# Patient Record
Sex: Female | Born: 1965 | Race: White | Hispanic: No | Marital: Married | Smoking: Never smoker
Health system: Southern US, Community
[De-identification: ages and names within clinical notes are randomized; demographics above are authoritative.]

## PROBLEM LIST (undated history)

## (undated) DIAGNOSIS — Z789 Other specified health status: Secondary | ICD-10-CM

## (undated) DIAGNOSIS — T148XXA Other injury of unspecified body region, initial encounter: Secondary | ICD-10-CM

## (undated) DIAGNOSIS — Z9889 Other specified postprocedural states: Secondary | ICD-10-CM

## (undated) DIAGNOSIS — Z950 Presence of cardiac pacemaker: Secondary | ICD-10-CM

## (undated) DIAGNOSIS — I442 Atrioventricular block, complete: Secondary | ICD-10-CM

## (undated) DIAGNOSIS — Z45018 Encounter for adjustment and management of other part of cardiac pacemaker: Secondary | ICD-10-CM

## (undated) DIAGNOSIS — R011 Cardiac murmur, unspecified: Secondary | ICD-10-CM

## (undated) DIAGNOSIS — R112 Nausea with vomiting, unspecified: Secondary | ICD-10-CM

## (undated) HISTORY — PX: VALVE REPLACEMENT: SUR13

## (undated) HISTORY — PX: INSERT / REPLACE / REMOVE PACEMAKER: SUR710

---
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
## 2001-08-20 ENCOUNTER — Other Ambulatory Visit: Admission: RE | Admit: 2001-08-20 | Discharge: 2001-08-20 | Payer: Self-pay | Admitting: Obstetrics and Gynecology

## 2002-12-10 HISTORY — PX: CARDIAC VALVE REPLACEMENT: SHX585

## 2003-03-15 ENCOUNTER — Other Ambulatory Visit: Admission: RE | Admit: 2003-03-15 | Discharge: 2003-03-15 | Payer: Self-pay | Admitting: Obstetrics and Gynecology

## 2003-03-31 ENCOUNTER — Encounter: Admission: RE | Admit: 2003-03-31 | Discharge: 2003-03-31 | Payer: Self-pay | Admitting: Obstetrics and Gynecology

## 2003-03-31 ENCOUNTER — Encounter: Payer: Self-pay | Admitting: Obstetrics and Gynecology

## 2003-04-22 ENCOUNTER — Ambulatory Visit (HOSPITAL_COMMUNITY): Admission: RE | Admit: 2003-04-22 | Discharge: 2003-04-22 | Payer: Self-pay | Admitting: Cardiovascular Disease

## 2003-04-22 ENCOUNTER — Encounter: Payer: Self-pay | Admitting: Cardiovascular Disease

## 2003-04-27 ENCOUNTER — Encounter: Payer: Self-pay | Admitting: Internal Medicine

## 2003-04-27 ENCOUNTER — Emergency Department (HOSPITAL_COMMUNITY): Admission: EM | Admit: 2003-04-27 | Discharge: 2003-04-27 | Payer: Self-pay | Admitting: Emergency Medicine

## 2003-04-27 ENCOUNTER — Encounter: Payer: Self-pay | Admitting: Cardiology

## 2003-08-09 DIAGNOSIS — L80 Vitiligo: Secondary | ICD-10-CM | POA: Insufficient documentation

## 2003-09-20 ENCOUNTER — Encounter (HOSPITAL_COMMUNITY): Admission: RE | Admit: 2003-09-20 | Discharge: 2003-12-19 | Payer: Self-pay | Admitting: Interventional Cardiology

## 2003-11-22 ENCOUNTER — Encounter: Admission: RE | Admit: 2003-11-22 | Discharge: 2003-11-22 | Payer: Self-pay | Admitting: Interventional Cardiology

## 2003-11-22 ENCOUNTER — Encounter (INDEPENDENT_AMBULATORY_CARE_PROVIDER_SITE_OTHER): Payer: Self-pay | Admitting: *Deleted

## 2003-12-20 ENCOUNTER — Encounter (HOSPITAL_COMMUNITY): Admission: RE | Admit: 2003-12-20 | Discharge: 2004-03-19 | Payer: Self-pay | Admitting: Interventional Cardiology

## 2005-04-10 ENCOUNTER — Encounter: Admission: RE | Admit: 2005-04-10 | Discharge: 2005-04-10 | Payer: Self-pay | Admitting: Interventional Cardiology

## 2005-09-20 ENCOUNTER — Encounter: Admission: RE | Admit: 2005-09-20 | Discharge: 2005-09-20 | Payer: Self-pay | Admitting: Internal Medicine

## 2006-10-11 ENCOUNTER — Other Ambulatory Visit: Admission: RE | Admit: 2006-10-11 | Discharge: 2006-10-11 | Payer: Self-pay | Admitting: Obstetrics & Gynecology

## 2006-10-17 ENCOUNTER — Encounter: Admission: RE | Admit: 2006-10-17 | Discharge: 2006-10-17 | Payer: Self-pay | Admitting: Internal Medicine

## 2008-06-07 ENCOUNTER — Other Ambulatory Visit: Admission: RE | Admit: 2008-06-07 | Discharge: 2008-06-07 | Payer: Self-pay | Admitting: Obstetrics & Gynecology

## 2009-07-15 ENCOUNTER — Encounter: Admission: RE | Admit: 2009-07-15 | Discharge: 2009-07-15 | Payer: Self-pay | Admitting: Obstetrics & Gynecology

## 2011-02-09 ENCOUNTER — Other Ambulatory Visit (HOSPITAL_COMMUNITY): Payer: 59

## 2011-02-12 ENCOUNTER — Ambulatory Visit (HOSPITAL_COMMUNITY): Admission: RE | Admit: 2011-02-12 | Payer: 59 | Source: Ambulatory Visit | Admitting: Obstetrics & Gynecology

## 2011-04-27 NOTE — Consult Note (Signed)
NAME:  Kayla Archer, Kayla Archer                        ACCOUNT NO.:  [id number]   MEDICAL RECORD NO.:  [id number]                   PATIENT TYPE:  EMS   LOCATION:  [facility]                                 FACILITY:  [facility]   PHYSICIAN:  Rollene Rotunda, M.D.                DATE OF BIRTH:  [birth year]   DATE OF CONSULTATION:  DATE OF DISCHARGE:                                   CONSULTATION   PRIMARY CARE PHYSICIAN:  Dr. Jonita Albee, [facility].   HISTORY OF PRESENT ILLNESS:  The patient is a pleasant, 45 year old white  female with a history of bicuspid aortic valve and aortic stenosis.  It is  mild to moderate by echocardiogram.  She does have a dilated ascending  aorta.  This is approximately 5 cm by MRI.  She has a 1.6 cm descending  aorta by comparison.  She is being evaluated by surgeons at the [facility] [place] (she has an appointment in the future with them) and is also to see  Dr. Laneta Simmers for consideration of repair of this, given the size of the aorta.  She is followed by Dr. Eden Emms.  She recently had the MRI and was given these  results.  She has been anxious since then.  She traveled to [facility] via  plane this weekend.  She was quite anxious the entire time she was there.  Last night, she felt some chest discomfort and was able to go to bed.  This  morning, she awoke and had 10/10 discomfort.  It was unlike pain she has had  before.  It was a discomfort but did not radiate to her neck or to her  shoulder blades.  There was no radiation down her arms.  She was mildly  nauseated with it.  She said she felt a little difficulty in taking a deep  breath with very slight discomfort with this.  She admits to be extremely  anxious and reports having had recent panic attacks.  She did not try to  take anything over the counter to relieve this.  She presented to the  emergency room.   PAST MEDICAL HISTORY:  1. Mild to moderate aortic stenosis.  2. Bicuspid aortic valve  disease.  3. Lyme disease.  4. Vitiligo.   PAST SURGICAL HISTORY:  None.   ALLERGIES:  NONE.   MEDICATIONS:  None.   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  She does not  smoke cigarettes and does not drink alcohol except rarely, on occasion.  She  does not use recreational drugs.   FAMILY HISTORY:  Noncontributory.   REVIEW OF SYSTEMS:  As stated in the HPI, otherwise negative for other  systems.   PHYSICAL EXAMINATION:  GENERAL:  The patient is in no distress.  VITAL SIGNS:  Blood pressure 131/88 in both arms, heart rate 78 and regular.  HEENT:  Eyes are unremarkable.  Pupils are equal, round and reactive to  light.  Fundi are not visualized.  Oral mucosa unremarkable.  NECK:  No jugular venous distention, wave form within normal limits, carotid  upstroke brisk and symmetric, no bruits or thyromegaly.  LYMPHATIC:  No cervical, axillary or inguinal adenopathy.  LUNGS:  Clear to auscultation bilaterally.  BACK:  No costovertebral angle tenderness.  CHEST:  Unremarkable.  HEART:  PMI not displaced, sustained.  S1 unremarkable, probable opening  snap, no S3, no S4, 2/6 apical systolic murmur with brief radiation at the  aortic outflow tract, no diastolic murmurs.  ABDOMEN:  Flat, positive bowel sounds, normal in frequency and pitch, no  abnormal bruits, no rebound, guarding, no midline pulsatile mass, no  hepatomegaly, no splenomegaly.  SKIN:  No rash, no nodules.  EXTREMITIES:  2+ pulses throughout.  No edema.  NEUROLOGIC:  Oriented to person, place and time.  Cranial nerves II-XII  grossly intact.  Motor grossly intact throughout.   LABORATORY DATA:  Sodium 138, potassium 3.6, BUN 11, creatinine 0.6, WBCs  5.5, hemoglobin 12.8, platelets 242, AST 25, ALT 19, alkaline phosphatase  71, total bilirubin 0.6, total protein 6.8.   Chest x-ray, prominent ascending aorta, no acute disease.   EKG, sinus rhythm, axis within normal limits, intervals are within  normal  limits, no acute ST-T wave changes.   Chest CT, no evidence of dissection or leak.   ASSESSMENT/PLAN:  1. The patient was evaluated in the emergency room.  I had little suspicion     for any dissection, however, given her anxiety and the size of her aorta,     we did perform a CT scan with the results as above.  There was no     evidence of dissection.  She was much relieved with this.  By the time     the results came back, she was pain free.  No further cardiovascular     testing is suggested.  She will follow up with Dr. Laneta Simmers in the     [facility].  2. Breast mass.  There is a 1.8 cm mass reported in the medial right breast.     We will discuss this with Dr. Eden Emms for follow-up.                                               Rollene Rotunda, M.D.    JH/MEDQ  D:  04/27/2003  T:  04/27/2003  Job:  [id number]   cc:   Jonita Albee, M.D.  [facility]   Charlton Haws, M.D.

## 2012-12-24 ENCOUNTER — Other Ambulatory Visit: Payer: Self-pay | Admitting: Obstetrics and Gynecology

## 2012-12-24 DIAGNOSIS — Z1231 Encounter for screening mammogram for malignant neoplasm of breast: Secondary | ICD-10-CM

## 2013-01-19 ENCOUNTER — Ambulatory Visit
Admission: RE | Admit: 2013-01-19 | Discharge: 2013-01-19 | Disposition: A | Discharge status: Home / Self Care | Payer: No Typology Code available for payment source | Source: Ambulatory Visit | Attending: Obstetrics and Gynecology | Admitting: Obstetrics and Gynecology

## 2013-01-19 DIAGNOSIS — Z1231 Encounter for screening mammogram for malignant neoplasm of breast: Secondary | ICD-10-CM

## 2013-03-05 ENCOUNTER — Other Ambulatory Visit: Payer: Self-pay | Admitting: Obstetrics and Gynecology

## 2013-10-20 DIAGNOSIS — M25569 Pain in unspecified knee: Secondary | ICD-10-CM | POA: Insufficient documentation

## 2013-10-27 ENCOUNTER — Other Ambulatory Visit: Payer: Self-pay | Admitting: Orthopedic Surgery

## 2013-11-02 ENCOUNTER — Inpatient Hospital Stay (HOSPITAL_COMMUNITY): Admission: RE | Admit: 2013-11-02 | Payer: No Typology Code available for payment source | Source: Ambulatory Visit

## 2013-11-09 ENCOUNTER — Encounter (HOSPITAL_COMMUNITY): Admission: RE | Payer: Self-pay | Source: Ambulatory Visit

## 2013-11-09 ENCOUNTER — Ambulatory Visit (HOSPITAL_COMMUNITY)
Admission: RE | Admit: 2013-11-09 | Payer: No Typology Code available for payment source | Source: Ambulatory Visit | Admitting: Orthopedic Surgery

## 2013-11-09 SURGERY — ARTHROSCOPY, KNEE
Anesthesia: Regional | Laterality: Right

## 2013-12-14 ENCOUNTER — Other Ambulatory Visit: Payer: Self-pay | Admitting: Orthopedic Surgery

## 2013-12-21 ENCOUNTER — Encounter (HOSPITAL_COMMUNITY): Payer: Self-pay

## 2013-12-21 NOTE — Progress Notes (Addendum)
Anesthesia Chart Review:  Patient is a 48 year old female scheduled for right knee arthroscopy on 12/28/13 by Dr. Sherlean FootLucey.  PAT is scheduled for 12/23/13.  I received cardiology notes to review yesterday afternoon.  History includes Bicuspid AV [facility]/p Bentall [facility] using a Dacron graft and 23 mm bovine pericardial valve.  Her cardiologist is Dr. Verdis PrimeHenry Smith, last visit 09/30/12.    Echo on 09/30/12 Deboraha Sprang([facility]) showed: Bioprosthetic AV with increase in velocity since the prior study (04/19/10), AVA (vel) 1.26 cm2, AVA (vti) 1.47 cm2, peak gradient 41 mmHg, mean gradient 19 mmHg.  Mild prosthetic valvular regurgitation, trace MR, LVEF estimated at 65-70%, mild concentric LVH, doppler findings suggestive of grade II diastolic dysfunction with elevated LA pressure.    I'll plan to speak with patient and review additional history at her PAT visit tomorrow.  I reviewed currently available information with anesthesiologist Dr. Jacklynn BueMassagee.  Patient had mild AS by her echo less than 1 1/2 years ago.  If no new CV symptoms and other preoperative testing is unremarkable then it is anticipated that she can proceed as planned.  Velna Ochsllison Costa Jha, PA-C [facility]/Anesthesiology Phone [phone] 12/22/2013 2:58 PM  Addendum: 12/23/2013 4:45 PM: I evaluated patient during her PAT visit.  She feels well from a CV standpoint.  She denies chest pain, SOB, edema, syncope, or significant palpitations.  Until her knee was bothering her she was able to play tennis.  She is now using the elliptical. Exam show a pleasant Caucasian female in NAD.  BMI 24.7.  Heart regular, with II-III/VI SEM.  Lungs clear.  Preoperative EKG, CXR, and labs noted.  Urine culture is pending.  Anticipate that she can proceed as planned.

## 2013-12-22 NOTE — H&P (Signed)
  NAME: Kayla BullocksMitrina A Archer MRN:   [id number] DOB:   1966/07/16     HISTORY AND PHYSICAL  CHIEF COMPLAINT:  Right shoulder pain  HISTORY:   Kayla A Knightis a 48 y.o. female  with right  Shoulder Pain Patient complains of right shoulder pain. The symptoms began several weeks ago. Aggravating factors: no known event. Pain is located around the acromioclavicular [facility](AC) joint. Discomfort is described as aching. Symptoms are exacerbated by repetitive movements. Evaluation to date: plain films: abnormal ac osteoarthritis and MRI: abnormal rotator cuff tear. Therapy to date includes: avoidance of offending activity, OTC analgesics which are somewhat effective, prescription NSAIDS which are somewhat effective, home exercises which are somewhat effective, physical therapy which was somewhat effective and corticosteroid injection which was somewhat effective.  PAST MEDICAL HISTORY:  No past medical history on file.  PAST SURGICAL HISTORY:  No past surgical history on file.  MEDICATIONS:   No prescriptions prior to admission    ALLERGIES:  No Known Allergies  REVIEW OF SYSTEMS:   Negative except hpi  FAMILY HISTORY:  No family history on file.  SOCIAL HISTORY:   has no tobacco, alcohol, and drug history on file.  PHYSICAL EXAM:  General appearance: alert, cooperative and no distress Resp: clear to auscultation bilaterally Cardio: regular rate and rhythm, S1, S2 normal, no murmur, click, rub or gallop GI: soft, non-tender; bowel sounds normal; no masses,  no organomegaly Extremities: extremities normal, atraumatic, no cyanosis or edema Pulses: 2+ and symmetric Skin: Skin color, texture, turgor normal. No rashes or lesions Neurologic: Alert and oriented X 3, normal strength and tone. Normal symmetric reflexes. Normal coordination and gait    LABORATORY STUDIES: No results found for this basename: WBC, HGB, HCT, PLT,  in the last 72 hours  No results found for this basename: NA, K, CL, CO2,  GLUCOSE, BUN, CREATININE, CALCIUM,  in the last 72 hours  STUDIES/RESULTS:  No results found.  ASSESSMENT:right rotator cuff tear        Active Problems:   * No active hospital problems. *    PLAN:  Right shoulder decompression and rotator cuff repair   Dashanique Brownstein 12/22/2013. 1:38 PM

## 2013-12-23 ENCOUNTER — Ambulatory Visit (HOSPITAL_COMMUNITY)
Admission: RE | Admit: 2013-12-23 | Discharge: 2013-12-23 | Disposition: A | Discharge status: Home / Self Care | Payer: 59 | Source: Ambulatory Visit | Attending: Orthopedic Surgery | Admitting: Orthopedic Surgery

## 2013-12-23 ENCOUNTER — Encounter (HOSPITAL_COMMUNITY): Payer: Self-pay

## 2013-12-23 ENCOUNTER — Encounter (HOSPITAL_COMMUNITY)
Admission: RE | Admit: 2013-12-23 | Discharge: 2013-12-23 | Disposition: A | Discharge status: Home / Self Care | Payer: 59 | Source: Ambulatory Visit | Attending: Orthopedic Surgery | Admitting: Orthopedic Surgery

## 2013-12-23 ENCOUNTER — Other Ambulatory Visit (HOSPITAL_COMMUNITY): Payer: Self-pay

## 2013-12-23 DIAGNOSIS — Z01810 Encounter for preprocedural cardiovascular examination: Secondary | ICD-10-CM | POA: Insufficient documentation

## 2013-12-23 DIAGNOSIS — Z954 Presence of other heart-valve replacement: Secondary | ICD-10-CM | POA: Insufficient documentation

## 2013-12-23 DIAGNOSIS — Z01812 Encounter for preprocedural laboratory examination: Secondary | ICD-10-CM | POA: Insufficient documentation

## 2013-12-23 DIAGNOSIS — Z01818 Encounter for other preprocedural examination: Secondary | ICD-10-CM | POA: Insufficient documentation

## 2013-12-23 HISTORY — DX: Nausea with vomiting, unspecified: R11.2

## 2013-12-23 HISTORY — DX: Other specified health status: Z78.9

## 2013-12-23 HISTORY — DX: Other injury of unspecified body region, initial encounter: T14.8XXA

## 2013-12-23 HISTORY — DX: Other specified postprocedural states: Z98.890

## 2013-12-23 HISTORY — DX: Cardiac murmur, unspecified: R01.1

## 2013-12-23 LAB — URINALYSIS, ROUTINE W REFLEX MICROSCOPIC
BILIRUBIN URINE: NEGATIVE
GLUCOSE, UA: NEGATIVE mg/dL
Hgb urine dipstick: NEGATIVE
KETONES UR: NEGATIVE mg/dL
LEUKOCYTES UA: NEGATIVE
Nitrite: NEGATIVE
Protein, ur: NEGATIVE mg/dL
Specific Gravity, Urine: 1.025 (ref 1.005–1.030)
Urobilinogen, UA: 0.2 mg/dL (ref 0.0–1.0)
pH: 5.5 (ref 5.0–8.0)

## 2013-12-23 LAB — COMPREHENSIVE METABOLIC PANEL
ALT: 17 U/L (ref 0–35)
AST: 21 U/L (ref 0–37)
Albumin: 4.1 g/dL (ref 3.5–5.2)
Alkaline Phosphatase: 79 U/L (ref 39–117)
BILIRUBIN TOTAL: 0.4 mg/dL (ref 0.3–1.2)
BUN: 15 mg/dL (ref 6–23)
CO2: 23 meq/L (ref 19–32)
Calcium: 9.7 mg/dL (ref 8.4–10.5)
Chloride: 102 mEq/L (ref 96–112)
Creatinine, Ser: 0.62 mg/dL (ref 0.50–1.10)
GLUCOSE: 91 mg/dL (ref 70–99)
Potassium: 4.4 mEq/L (ref 3.7–5.3)
SODIUM: 138 meq/L (ref 137–147)
Total Protein: 7.4 g/dL (ref 6.0–8.3)

## 2013-12-23 LAB — CBC WITH DIFFERENTIAL/PLATELET
BASOS ABS: 0 10*3/uL (ref 0.0–0.1)
Basophils Relative: 0 % (ref 0–1)
Eosinophils Absolute: 0.1 10*3/uL (ref 0.0–0.7)
Eosinophils Relative: 1 % (ref 0–5)
HEMATOCRIT: 40.6 % (ref 36.0–46.0)
Hemoglobin: 13.8 g/dL (ref 12.0–15.0)
LYMPHS ABS: 1.6 10*3/uL (ref 0.7–4.0)
LYMPHS PCT: 16 % (ref 12–46)
MCH: 32 pg (ref 26.0–34.0)
MCHC: 34 g/dL (ref 30.0–36.0)
MCV: 94.2 fL (ref 78.0–100.0)
Monocytes Absolute: 0.6 10*3/uL (ref 0.1–1.0)
Monocytes Relative: 6 % (ref 3–12)
NEUTROS ABS: 7.6 10*3/uL (ref 1.7–7.7)
Neutrophils Relative %: 77 % (ref 43–77)
Platelets: 197 10*3/uL (ref 150–400)
RBC: 4.31 MIL/uL (ref 3.87–5.11)
RDW: 12.7 % (ref 11.5–15.5)
WBC: 9.9 10*3/uL (ref 4.0–10.5)

## 2013-12-23 LAB — PROTIME-INR
INR: 1.08 (ref 0.00–1.49)
Prothrombin Time: 13.8 seconds (ref 11.6–15.2)

## 2013-12-23 LAB — APTT: APTT: 30 s (ref 24–37)

## 2013-12-23 LAB — HCG, SERUM, QUALITATIVE: PREG SERUM: NEGATIVE

## 2013-12-23 NOTE — Pre-Procedure Instructions (Addendum)
Kayla Archer  12/23/2013   Your procedure is scheduled on:  12/28/13  [facility] at 830 AM.  Call this number if you have problems the morning of surgery: [phone]   Remember:   Do not eat food or drink liquids after midnight.   Take these medicines the morning of surgery with A SIP OF WATER: none          STOP all herbel meds, nsaids (aleve,naproxen,advil,ibuprofen) tomorrow including vitamins   Do not wear jewelry, make-up or nail polish.  Do not wear lotions, powders, or perfumes. You may wear deodorant.  Do not shave 48 hours prior to surgery. Men may shave face and neck.  Do not bring valuables to the hospital.  [facility] is not responsible                  for any belongings or valuables.               Contacts, dentures or bridgework may not be worn into surgery.  Leave suitcase in the car. After surgery it may be brought to your room.  For patients admitted to the hospital, discharge time is determined by your                treatment team.               Patients discharged the day of surgery will not be allowed to drive  home.  Name and phone number of your driver:   Special Instructions: Shower using CHG 2 nights before surgery and the night before surgery.  If you shower the day of surgery use CHG.  Use special wash - you have one bottle of CHG for all showers.  You should use approximately 1/3 of the bottle for each shower.   Please read over the following fact sheets that you were given: Pain Booklet, Coughing and Deep Breathing and Surgical Site Infection Prevention

## 2013-12-24 LAB — URINE CULTURE: Colony Count: 50000

## 2013-12-27 MED ORDER — CHLORHEXIDINE GLUCONATE 4 % EX LIQD: 60.0000 mL | Freq: Once | CUTANEOUS | Status: DC

## 2013-12-27 MED ORDER — CEFAZOLIN SODIUM-DEXTROSE 2-3 GM-% IV SOLR
2.0000 g | INTRAVENOUS | Status: AC
  Administered 2013-12-28: 2 g via INTRAVENOUS
  Filled 2013-12-27: qty 50

## 2013-12-28 ENCOUNTER — Ambulatory Visit (HOSPITAL_COMMUNITY)
Admission: RE | Admit: 2013-12-28 | Discharge: 2013-12-28 | Disposition: A | Discharge status: Home / Self Care | Payer: 59 | Source: Ambulatory Visit | Attending: Orthopedic Surgery | Admitting: Orthopedic Surgery

## 2013-12-28 ENCOUNTER — Encounter (HOSPITAL_COMMUNITY): Payer: 59 | Admitting: Vascular Surgery

## 2013-12-28 ENCOUNTER — Encounter (HOSPITAL_COMMUNITY)
Admission: RE | Disposition: A | Discharge status: Home / Self Care | Payer: Self-pay | Source: Ambulatory Visit | Attending: Orthopedic Surgery

## 2013-12-28 ENCOUNTER — Encounter (HOSPITAL_COMMUNITY): Payer: Self-pay | Admitting: Certified Registered"

## 2013-12-28 ENCOUNTER — Ambulatory Visit (HOSPITAL_COMMUNITY): Payer: 59 | Admitting: Certified Registered"

## 2013-12-28 DIAGNOSIS — M6750 Plica syndrome, unspecified knee: Secondary | ICD-10-CM | POA: Insufficient documentation

## 2013-12-28 DIAGNOSIS — IMO0002 Reserved for concepts with insufficient information to code with codable children: Secondary | ICD-10-CM | POA: Insufficient documentation

## 2013-12-28 DIAGNOSIS — X58XXXA Exposure to other specified factors, initial encounter: Secondary | ICD-10-CM | POA: Insufficient documentation

## 2013-12-28 HISTORY — PX: KNEE ARTHROSCOPY: SHX127

## 2013-12-28 SURGERY — ARTHROSCOPY, KNEE
Anesthesia: General | Site: Knee | Laterality: Right

## 2013-12-28 MED ORDER — PROPOFOL 10 MG/ML IV BOLUS
INTRAVENOUS | Status: DC | PRN
  Administered 2013-12-28: 160 mg via INTRAVENOUS

## 2013-12-28 MED ORDER — ONDANSETRON HCL 4 MG/2ML IJ SOLN
INTRAMUSCULAR | Status: DC | PRN
  Administered 2013-12-28: 4 mg via INTRAVENOUS

## 2013-12-28 MED ORDER — MIDAZOLAM HCL 2 MG/2ML IJ SOLN
INTRAMUSCULAR | Status: AC
  Filled 2013-12-28: qty 2

## 2013-12-28 MED ORDER — LACTATED RINGERS IV SOLN
INTRAVENOUS | Status: DC
Start: 2013-12-28 — End: 2013-12-28
  Administered 2013-12-28: 10:00:00 via INTRAVENOUS

## 2013-12-28 MED ORDER — FENTANYL CITRATE 0.05 MG/ML IJ SOLN: 50.0000 ug | Freq: Once | INTRAMUSCULAR | Status: DC

## 2013-12-28 MED ORDER — HYDROMORPHONE HCL PF 1 MG/ML IJ SOLN: 0.2500 mg | INTRAMUSCULAR | Status: DC | PRN

## 2013-12-28 MED ORDER — ARTIFICIAL TEARS OP OINT
TOPICAL_OINTMENT | OPHTHALMIC | Status: DC | PRN
  Administered 2013-12-28: 1 via OPHTHALMIC

## 2013-12-28 MED ORDER — SODIUM CHLORIDE 0.9 % IR SOLN
Status: DC | PRN
  Administered 2013-12-28: 3000 mL

## 2013-12-28 MED ORDER — MIDAZOLAM HCL 2 MG/2ML IJ SOLN: 1.0000 mg | INTRAMUSCULAR | Status: DC | PRN

## 2013-12-28 MED ORDER — LIDOCAINE HCL (CARDIAC) 20 MG/ML IV SOLN
INTRAVENOUS | Status: DC | PRN
  Administered 2013-12-28: 80 mg via INTRAVENOUS

## 2013-12-28 MED ORDER — CHLORHEXIDINE GLUCONATE 4 % EX LIQD: 60.0000 mL | Freq: Once | CUTANEOUS | Status: DC

## 2013-12-28 MED ORDER — FENTANYL CITRATE 0.05 MG/ML IJ SOLN
INTRAMUSCULAR | Status: AC
  Filled 2013-12-28: qty 2

## 2013-12-28 MED ORDER — DEXAMETHASONE SODIUM PHOSPHATE 4 MG/ML IJ SOLN
INTRAMUSCULAR | Status: DC | PRN
  Administered 2013-12-28: 4 mg via INTRAVENOUS

## 2013-12-28 MED ORDER — PROMETHAZINE HCL 25 MG/ML IJ SOLN: 6.2500 mg | INTRAMUSCULAR | Status: DC | PRN

## 2013-12-28 MED ORDER — BUPIVACAINE-EPINEPHRINE (PF) 0.5% -1:200000 IJ SOLN
INTRAMUSCULAR | Status: AC
  Filled 2013-12-28: qty 10

## 2013-12-28 MED ORDER — BUPIVACAINE-EPINEPHRINE 0.5% -1:200000 IJ SOLN
INTRAMUSCULAR | Status: DC | PRN
  Administered 2013-12-28: 30 mL

## 2013-12-28 MED ORDER — OXYCODONE HCL 5 MG PO TABS: 5.0000 mg | ORAL_TABLET | Freq: Once | ORAL | Status: DC | PRN

## 2013-12-28 MED ORDER — FENTANYL CITRATE 0.05 MG/ML IJ SOLN
INTRAMUSCULAR | Status: DC | PRN
  Administered 2013-12-28: 100 ug via INTRAVENOUS
  Administered 2013-12-28: 50 ug via INTRAVENOUS

## 2013-12-28 MED ORDER — OXYCODONE HCL 5 MG/5ML PO SOLN: 5.0000 mg | Freq: Once | ORAL | Status: DC | PRN

## 2013-12-28 MED ORDER — MIDAZOLAM HCL 5 MG/5ML IJ SOLN
INTRAMUSCULAR | Status: DC | PRN
  Administered 2013-12-28: 2 mg via INTRAVENOUS

## 2013-12-28 MED ORDER — SODIUM CHLORIDE 0.9 % IV SOLN: INTRAVENOUS | Status: DC

## 2013-12-28 SURGICAL SUPPLY — 42 items
BANDAGE ELASTIC 6 VELCRO ST LF (GAUZE/BANDAGES/DRESSINGS) ×2 IMPLANT
BANDAGE ESMARK 6X9 LF (GAUZE/BANDAGES/DRESSINGS) IMPLANT
BLADE CUDA 5.5 (BLADE) IMPLANT
BLADE CUTTER GATOR 3.5 (BLADE) IMPLANT
BLADE GREAT WHITE 4.2 (BLADE) ×2 IMPLANT
BNDG CMPR 9X6 STRL LF SNTH (GAUZE/BANDAGES/DRESSINGS)
BNDG ESMARK 6X9 LF (GAUZE/BANDAGES/DRESSINGS)
BUR OVAL 6.0 (BURR) IMPLANT
CLOTH BEACON ORANGE TIMEOUT ST (SAFETY) ×2 IMPLANT
CUFF TOURNIQUET SINGLE 34IN LL (TOURNIQUET CUFF) ×1 IMPLANT
DRAPE ARTHROSCOPY W/POUCH 114 (DRAPES) ×2 IMPLANT
DRAPE U-SHAPE 47X51 STRL (DRAPES) ×2 IMPLANT
DRSG EMULSION OIL 3X3 NADH (GAUZE/BANDAGES/DRESSINGS) ×1 IMPLANT
ELECT CAUTERY BLADE 6.4 (BLADE) IMPLANT
ELECT MENISCUS 165MM 90D (ELECTRODE) IMPLANT
ELECT REM PT RETURN 9FT ADLT (ELECTROSURGICAL)
ELECTRODE REM PT RTRN 9FT ADLT (ELECTROSURGICAL) IMPLANT
GAUZE XEROFORM 1X8 LF (GAUZE/BANDAGES/DRESSINGS) ×1 IMPLANT
GLOVE BIOGEL PI IND STRL 8.5 (GLOVE) ×2 IMPLANT
GLOVE BIOGEL PI INDICATOR 8.5 (GLOVE) ×1
GLOVE SURG ORTHO 8.0 STRL STRW (GLOVE) ×3 IMPLANT
GOWN L4 XLG 20 PK N/S (GOWN DISPOSABLE) ×2 IMPLANT
GOWN STRL NON-REIN LRG LVL3 (GOWN DISPOSABLE) ×3 IMPLANT
KIT ROOM TURNOVER OR (KITS) ×2 IMPLANT
MANIFOLD NEPTUNE II (INSTRUMENTS) ×2 IMPLANT
NDL 18GX1X1/2 (RX/OR ONLY) (NEEDLE) ×1 IMPLANT
NEEDLE 18GX1X1/2 (RX/OR ONLY) (NEEDLE) ×2 IMPLANT
PACK ARTHROSCOPY DSU (CUSTOM PROCEDURE TRAY) ×2 IMPLANT
PAD ARMBOARD 7.5X6 YLW CONV (MISCELLANEOUS) ×3 IMPLANT
PADDING CAST COTTON 6X4 STRL (CAST SUPPLIES) ×1 IMPLANT
PENCIL BUTTON HOLSTER BLD 10FT (ELECTRODE) IMPLANT
SET ARTHROSCOPY TUBING (MISCELLANEOUS) ×2
SET ARTHROSCOPY TUBING LN (MISCELLANEOUS) ×1 IMPLANT
SPONGE GAUZE 4X4 12PLY (GAUZE/BANDAGES/DRESSINGS) ×2 IMPLANT
SPONGE LAP 4X18 X RAY DECT (DISPOSABLE) ×2 IMPLANT
SUT ETHILON 4 0 PS 2 18 (SUTURE) ×2 IMPLANT
SYR 30ML LL (SYRINGE) ×4 IMPLANT
TOWEL OR 17X24 6PK STRL BLUE (TOWEL DISPOSABLE) ×2 IMPLANT
TOWEL OR 17X26 10 PK STRL BLUE (TOWEL DISPOSABLE) ×2 IMPLANT
TUBE CONNECTING 12X1/4 (SUCTIONS) ×2 IMPLANT
WAND 90 DEG TURBOVAC W/CORD (SURGICAL WAND) ×1 IMPLANT
WATER STERILE IRR 1000ML POUR (IV SOLUTION) ×2 IMPLANT

## 2013-12-28 NOTE — Interval H&P Note (Signed)
History and Physical Interval Note:  12/28/2013 6:51 AM  Kayla Archer  has presented today for surgery, with the diagnosis of medial meniscus tear right knee  The various methods of treatment have been discussed with the patient and family. After consideration of risks, benefits and other options for treatment, the patient has consented to  Procedure(s): ARTHROSCOPY KNEE (Right) as a surgical intervention .  The patient's history has been reviewed, patient examined, no change in status, stable for surgery.  I have reviewed the patient's chart and labs.  Questions were answered to the patient's satisfaction.     Searra Carnathan,STEPHEN D

## 2013-12-28 NOTE — Transfer of Care (Signed)
Immediate Anesthesia Transfer of Care Note  Patient: Kayla Archer  Procedure(s) Performed: Procedure(s): ARTHROSCOPY KNEE (Right)  Patient Location: PACU  Anesthesia Type:General  Level of Consciousness: awake, alert  and oriented  Airway & Oxygen Therapy: Patient Spontanous Breathing and Patient connected to nasal cannula oxygen  Post-op Assessment: Report given to PACU RN and Patient moving all extremities X 4  Post vital signs: Reviewed and stable  Complications: No apparent anesthesia complications

## 2013-12-28 NOTE — Preoperative (Signed)
Beta Blockers   Reason not to administer Beta Blockers:Not Applicable 

## 2013-12-28 NOTE — Op Note (Signed)
Dictation Number:  [id number]

## 2013-12-28 NOTE — Anesthesia Preprocedure Evaluation (Addendum)
Anesthesia Evaluation  Patient identified by MRN, date of birth, ID band Patient awake    Reviewed: Allergy & Precautions, H&P , NPO status , Patient's Chart, lab work & pertinent test results  History of Anesthesia Complications (+) PONV and history of anesthetic complications  Airway Mallampati: I TM Distance: >3 FB Neck ROM: Full    Dental  (+) Teeth Intact and Dental Advisory Given   Pulmonary  breath sounds clear to auscultation        Cardiovascular + Valvular Problems/Murmurs Rhythm:Regular Rate:Normal + Systolic murmurs H/o AVR   Neuro/Psych    GI/Hepatic   Endo/Other    Renal/GU      Musculoskeletal   Abdominal   Peds  Hematology   Anesthesia Other Findings   Reproductive/Obstetrics                          Anesthesia Physical Anesthesia Plan  ASA: II  Anesthesia Plan: General   Post-op Pain Management:    Induction: Intravenous  Airway Management Planned: LMA  Additional Equipment:   Intra-op Plan:   Post-operative Plan: Extubation in OR  Informed Consent: I have reviewed the patients History and Physical, chart, labs and discussed the procedure including the risks, benefits and alternatives for the proposed anesthesia with the patient or authorized representative who has indicated his/her understanding and acceptance.     Plan Discussed with: CRNA and Surgeon  Anesthesia Plan Comments:         Anesthesia Quick Evaluation

## 2013-12-28 NOTE — Discharge Instructions (Signed)
Diet: As you were doing prior to hospitalization  ° °Activity:  Increase activity slowly as tolerated  °                No lifting or driving for 6 weeks ° °Shower:  May shower without a dressing once there is no drainage from your wound. °                Do NOT wash over the wound. °                °Dressing:  You may change your dressing on Wednesday °                   Then change the dressing daily with sterile 4"x4"s gauze dressing  °                   And TED hose for knees. ° °Weight Bearing:  Weight bearing as tolerated as taught in physical therapy.  Use a                                walker or Crutches as instructed. ° °To prevent constipation: you may use a stool softener such as - °              Colace ( over the counter) 100 mg by mouth twice a day  °              Drink plenty of fluids ( prune juice may be helpful) and high fiber foods °               Miralax ( over the counter) for constipation as needed.   ° °Precautions:  If you experience chest pain or shortness of breath - call 911 immediately               For transfer to the hospital emergency department!! °              If you develop a fever greater that 101 F, purulent drainage from wound,                             increased redness or drainage from wound, or calf pain -- Call the office. ° °Follow- Up Appointment:  Please call for an appointment to be seen on Monday January 04, 2014 °                                             [place] office:  [phone] °           [address] [place], [place] [address] ° °What to eat: ° °For your first meals, you should eat lightly; only small meals initially.  If you do not have nausea, you may eat larger meals.  Avoid spicy, greasy and heavy food.   ° °General Anesthesia, Adult, Care After  °Refer to this sheet in the next few weeks. These instructions provide you with information on caring for yourself after your procedure. Your health care provider may also give you more specific  instructions. Your treatment has been planned according to current medical practices, but problems sometimes occur. Call your health care provider if you have any problems or questions   after your procedure.  °WHAT TO EXPECT AFTER THE PROCEDURE  °After the procedure, it is typical to experience:  °Sleepiness.  °Nausea and vomiting. °HOME CARE INSTRUCTIONS  °For the first 24 hours after general anesthesia:  °Have a responsible person with you.  °Do not drive a car. If you are alone, do not take public transportation.  °Do not drink alcohol.  °Do not take medicine that has not been prescribed by your health care provider.  °Do not sign important papers or make important decisions.  °You may resume a normal diet and activities as directed by your health care provider.  °Change bandages (dressings) as directed.  °If you have questions or problems that seem related to general anesthesia, call the hospital and ask for the anesthetist or anesthesiologist on call. °SEEK MEDICAL CARE IF:  °You have nausea and vomiting that continue the day after anesthesia.  °You develop a rash. °SEEK IMMEDIATE MEDICAL CARE IF:  °You have difficulty breathing.  °You have chest pain.  °You have any allergic problems. °Document Released: 03/04/2001 Document Revised: 07/29/2013 Document Reviewed: 06/11/2013  °ExitCare® Patient Information ©2014 [employer].  ° ° °               ° ° °

## 2013-12-28 NOTE — Anesthesia Postprocedure Evaluation (Signed)
  Anesthesia Post-op Note  Patient: Kayla Archer  Procedure(s) Performed: Procedure(s): ARTHROSCOPY KNEE (Right)  Patient Location: PACU  Anesthesia Type:General  Level of Consciousness: awake and alert   Airway and Oxygen Therapy: Patient Spontanous Breathing  Post-op Pain: mild  Post-op Assessment: Post-op Vital signs reviewed, Patient's Cardiovascular Status Stable, Respiratory Function Stable, Patent Airway, No signs of Nausea or vomiting and Pain level controlled  Post-op Vital Signs: Reviewed and stable  Complications: No apparent anesthesia complications

## 2013-12-29 NOTE — Op Note (Signed)
NAMMerlyn Albert:  Archer, Kayla Archer              ACCOUNT NO.:  [id number]  MEDICAL RECORD NO.:  [id number]  LOCATION:  [facility]                         FACILITY:  [facility]  PHYSICIAN:  Mila HomerStephen D. Sherlean FootLucey, M.D. DATE OF BIRTH:  Sep 25, 1966  DATE OF PROCEDURE:  12/28/2013 DATE OF DISCHARGE:  12/28/2013                              OPERATIVE REPORT   SURGEON:  Mila HomerStephen D. Sherlean FootLucey, M.D.  ASSISTANT:  None.  ANESTHESIA:  General.  PREOPERATIVE DIAGNOSIS:  Right knee medial meniscus tear.  POSTOPERATIVE DIAGNOSIS:  Right knee medial meniscus tear.  PROCEDURE:  Right knee arthroscopy with partial medial meniscectomy, chondroplasty of medial femoral condyle, and plica resection.  INDICATION FOR PROCEDURE:  The patient is a 48 year old white female __________.  Informed consent was obtained.  DESCRIPTION OF PROCEDURE:  The patient was laid supine, administered general anesthesia.  Right leg prepped and draped in usual sterile fashion.  Inferolateral and inferomedial portal were created with an #11 blade, blunt trocar, and cannula.  Diagnostic arthroscopy revealed no chondromalacia in the patellofemoral joint __________ which was __________ and I elected to debride this with a small rongeur.  I then went into the medial compartment and with a 7 x 7 mm loose articular cartilage, I debrided that __________ area.  I then went into the posterior recess of the medial compartment and found the radial tear of the meniscus __________ straight forceps and __________ shaver. __________ ACL and PCL were normal __________ lateral compartment in the __________ position, this was normal as well.  I then washed, __________ dressed with Xeroform, dressing sponges, sterile Webril, and Ace wrap.  COMPLICATIONS:  None.  DRAINS:  None.         ______________________________ Mila HomerStephen D. Sherlean FootLucey, M.D.    SDL/MEDQ  D:  12/28/2013  T:  12/29/2013  Job:  [id number]

## 2013-12-31 ENCOUNTER — Encounter (HOSPITAL_COMMUNITY): Payer: Self-pay | Admitting: Orthopedic Surgery

## 2014-02-22 ENCOUNTER — Other Ambulatory Visit (HOSPITAL_COMMUNITY): Payer: Self-pay | Admitting: Otolaryngology

## 2014-02-22 DIAGNOSIS — R1310 Dysphagia, unspecified: Secondary | ICD-10-CM

## 2014-02-24 ENCOUNTER — Ambulatory Visit (HOSPITAL_COMMUNITY): Admission: RE | Admit: 2014-02-24 | Payer: 59 | Source: Ambulatory Visit

## 2014-02-24 ENCOUNTER — Ambulatory Visit (HOSPITAL_COMMUNITY): Payer: 59

## 2014-02-24 ENCOUNTER — Inpatient Hospital Stay (HOSPITAL_COMMUNITY): Admission: RE | Admit: 2014-02-24 | Payer: 59 | Source: Ambulatory Visit

## 2014-02-25 ENCOUNTER — Telehealth: Payer: Self-pay | Admitting: Interventional Cardiology

## 2014-02-25 NOTE — Telephone Encounter (Signed)
lmom.returned pt call.pt adv of appt already scheduled with Dawayne PatriciaLori G. on 03/05/14.pt f/u echo can be schduled then.pt to call the office if symptoms increase/worsen

## 2014-02-25 NOTE — Telephone Encounter (Signed)
New Prob    Pt reports new flutters in her chest and chest heaviness/tightness onset 1 month. Pt is concerned and would like to speak to a nurse.  Pt is also ready to schedule yearly ECHO. Order needed in EPIC.

## 2014-03-05 ENCOUNTER — Ambulatory Visit: Payer: 59 | Admitting: Nurse Practitioner

## 2014-03-10 ENCOUNTER — Ambulatory Visit (HOSPITAL_COMMUNITY)
Admission: RE | Admit: 2014-03-10 | Discharge: 2014-03-10 | Disposition: A | Discharge status: Home / Self Care | Payer: 59 | Source: Ambulatory Visit | Attending: Otolaryngology | Admitting: Otolaryngology

## 2014-03-10 DIAGNOSIS — R1310 Dysphagia, unspecified: Secondary | ICD-10-CM | POA: Insufficient documentation

## 2014-03-10 NOTE — Procedures (Signed)
Objective Swallowing Evaluation: Modified Barium Swallowing Study  Patient Details  Name: Kayla Archer MRN: [id number] Date of Birth: 1966-08-10  Today's Date: 03/10/2014 Time: [id number] SLP Time Calculation (min): 30 min  Past Medical History:  Past Medical History  Diagnosis Date  . PONV (postoperative nausea and vomiting)     plus hypotension  . Medical history non-contributory     see valve rep-  . Heart murmur   . Cartilage tear     rt knee   Past Surgical History:  Past Surgical History  Procedure Laterality Date  . Cardiac valve replacement  04    thoracic aortic aneurysm plus valve  . Knee arthroscopy Right 12/28/2013    Procedure: ARTHROSCOPY KNEE;  Surgeon: Dannielle HuhSteve Lucey, MD;  [facility];  Service: Orthopedics;  Laterality: Right;   HPI:  Pt. is a 48  yr old female seen for outpatient MBS with complaints of 3 year onset of esophageal/pharyngeal globus sensation gradually worsening. She denies chronic burning sensation but describes episodes of acute esophageal pain that completely resolves when food transits to LES.  She denies pna or additional acute illness.  Pt. has been evaluated by allergist and ENT.       Assessment / Plan / Recommendation Clinical Impression  Dysphagia Diagnosis: Within Functional Limits Clinical impression: Pt. exhibits a functional pharyngeal phase of swallow, however initiation is delayed to the level of the pyriform sinuses.  No laryngeal penetration, aspiration or pharyngeal residue observed.  If pt. is experiencing reflux with acidic material to pharyngeal area, this may explain decreased sensation leading to delayed swallow initiation.  Pt. scheduled for a barium esophagram following MBS.  SLP recommends she continue regular diet and thin liquids and provided education on esophageal precautions.  Educated pt. if symptoms persist, a GI consult may be beneficial.     Treatment Recommendation  No treatment recommended at this time     Diet Recommendation Regular;Thin liquid   Liquid Administration via: Cup;Straw Medication Administration: Whole meds with liquid Supervision: Patient able to self feed Compensations: Slow rate;Follow solids with liquid;Small sips/bites Postural Changes and/or Swallow Maneuvers: Seated upright 90 degrees;Upright 30-60 min after meal    Other  Recommendations Oral Care Recommendations: Oral care BID   Follow Up Recommendations  None    Frequency and Duration        Pertinent Vitals/Pain WDL            Reason for Referral Objectively evaluate swallowing function   Oral Phase Oral Preparation/Oral Phase Oral Phase: WFL   Pharyngeal Phase Pharyngeal Phase Pharyngeal Phase: Impaired Pharyngeal - Thin Pharyngeal - Thin Cup: Delayed swallow initiation;Premature spillage to pyriform sinuses Pharyngeal - Thin Straw: Delayed swallow initiation;Premature spillage to pyriform sinuses  Cervical Esophageal Phase    GO    Cervical Esophageal Phase [facility]ervical [facility]ophageal [facility]: [facility]    Functional Assessment Tool Used: clinical judgement Functional Limitations: Swallowing Swallow Current Status ([id number]): At least 1 percent but less than 20 percent impaired, limited or restricted Swallow Goal Status [phone](G8997): At least 1 percent but less than 20 percent impaired, limited or restricted Swallow Discharge Status [phone](G8998): At least 1 percent but less than 20 percent impaired, limited or restricted    Royce MacadamiaLisa Willis Patti Shorb M.Ed [employer] Pager [phone]  03/10/2014

## 2014-03-31 ENCOUNTER — Ambulatory Visit (INDEPENDENT_AMBULATORY_CARE_PROVIDER_SITE_OTHER): Payer: 59 | Admitting: Interventional Cardiology

## 2014-03-31 ENCOUNTER — Encounter: Payer: Self-pay | Admitting: Interventional Cardiology

## 2014-03-31 VITALS — BP 114/63 | HR 61 | Ht 63.0 in | Wt 137.0 lb

## 2014-03-31 DIAGNOSIS — I351 Nonrheumatic aortic (valve) insufficiency: Secondary | ICD-10-CM | POA: Insufficient documentation

## 2014-03-31 DIAGNOSIS — I359 Nonrheumatic aortic valve disorder, unspecified: Secondary | ICD-10-CM

## 2014-03-31 DIAGNOSIS — R072 Precordial pain: Secondary | ICD-10-CM

## 2014-03-31 DIAGNOSIS — R0789 Other chest pain: Secondary | ICD-10-CM

## 2014-03-31 DIAGNOSIS — Z953 Presence of xenogenic heart valve: Secondary | ICD-10-CM | POA: Insufficient documentation

## 2014-03-31 DIAGNOSIS — Z952 Presence of prosthetic heart valve: Secondary | ICD-10-CM

## 2014-03-31 NOTE — Progress Notes (Signed)
Patient ID: Tonda A Jilek, female   DOB: [birth year], 48 y.o.   MRN: [id number]    [address] [place], [place]  [name] Phone: [phone](336) [phone] Fax:  [phone]  Date:  03/31/2014   ID:  Carey BullocksMitrina A Waggoner, DOB [birth year], MRN [id number]  PCP:  Duke SalviaGORE,DENISE, NP   ASSESSMENT:  1. bioprosthetic aortic valve, 2005 2. Valve dysfunction with mild aortic regurgitation detected by echocardiogram and auscultation 2013, October 3. Recurring musculoskeletal sternal chest discomfort 4. Stress and anxiety  PLAN:  1. Increase physical activity 2. Followup in one year 3. Call of exertional chest discomfort or dyspnea   SUBJECTIVE: Corretta A Terrilee CroakKnight is a 48 y.o. female who is doing well. She is now 10 years out from bioprosthetic aortic valve replacement. She denies orthopnea, PND, prolonged palpitations, transient neurological symptoms, edema, and exertional chest pain. She has not had syncope.    Wt Readings from Last 3 Encounters:  03/31/14 137 lb (62.143 kg)  12/23/13 139 lb 4.8 oz (63.186 kg)     Past Medical History  Diagnosis Date  . PONV (postoperative nausea and vomiting)     plus hypotension  . Medical history non-contributory     see valve rep-  . Heart murmur   . Cartilage tear     rt knee    Current Outpatient Prescriptions  Medication Sig Dispense Refill  . calcium citrate-vitamin D (CITRACAL+D) 315-200 MG-UNIT per tablet Take 1 tablet by mouth as needed.       Marland Kitchen. ibuprofen (ADVIL,MOTRIN) 200 MG tablet Take 400 mg by mouth every 8 (eight) hours as needed for headache or mild pain.       No current facility-administered medications for this visit.    Allergies:   No Known Allergies  Social History:  The patient  reports that she has never smoked. She does not have any smokeless tobacco history on file. She reports that she drinks about 1.8 ounces of alcohol per week. She reports that she does not use illicit drugs.   ROS:  Please see the history of  present illness.   Appetite stable. Denies chills and fever.   All other systems reviewed and negative.   OBJECTIVE: VS:  BP 114/63  Pulse 61  Ht 5\' 3"  (1.6 m)  Wt 137 lb (62.143 kg)  BMI 24.27 kg/m2  LMP 02/06/2014 Well nourished, well developed, in no acute distress, appears consistent with her stated age HEENT: normal Neck: JVD flat. Carotid bruit absent with the exception of a transmitted murmur in the left carotid from the aortic valve  Cardiac:  normal S1, S2; RRR; 2/6 decrescendo murmur of aortic regurgitation murmur Lungs:  clear to auscultation bilaterally, no wheezing, rhonchi or rales Abd: soft, nontender, no hepatomegaly Ext: Edema absent. Pulses 2+ Skin: warm and dry Neuro:  CNs 2-12 intact, no focal abnormalities noted  EKG:  Not repeated       Signed, Darci NeedleHenry W. B. Cornella Emmer III, MD 03/31/2014 9:01 AM

## 2014-03-31 NOTE — Patient Instructions (Signed)
Your physician recommends that you continue on your current medications as directed. Please refer to the Current Medication list given to you today.  Your physician wants you to follow-up in: 1 year. You will receive a reminder letter in the mail two months in advance. If you don't receive a letter, please call our office to schedule the follow-up appointment.  

## 2014-07-07 ENCOUNTER — Encounter: Payer: Self-pay | Admitting: Interventional Cardiology

## 2014-11-02 ENCOUNTER — Other Ambulatory Visit: Payer: Self-pay | Admitting: Orthopedic Surgery

## 2014-11-02 DIAGNOSIS — Z9889 Other specified postprocedural states: Secondary | ICD-10-CM | POA: Insufficient documentation

## 2014-11-02 DIAGNOSIS — M25562 Pain in left knee: Principal | ICD-10-CM

## 2014-11-02 DIAGNOSIS — M25561 Pain in right knee: Secondary | ICD-10-CM

## 2014-11-08 ENCOUNTER — Ambulatory Visit
Admission: RE | Admit: 2014-11-08 | Discharge: 2014-11-08 | Disposition: A | Discharge status: Home / Self Care | Payer: 59 | Source: Ambulatory Visit | Attending: Orthopedic Surgery | Admitting: Orthopedic Surgery

## 2014-11-08 DIAGNOSIS — M25561 Pain in right knee: Secondary | ICD-10-CM

## 2014-11-08 DIAGNOSIS — M25562 Pain in left knee: Principal | ICD-10-CM

## 2015-02-14 ENCOUNTER — Telehealth: Payer: Self-pay | Admitting: Interventional Cardiology

## 2015-02-14 DIAGNOSIS — Z952 Presence of prosthetic heart valve: Secondary | ICD-10-CM

## 2015-02-14 NOTE — Telephone Encounter (Signed)
New Message   Pt called states that she normally has an ECHO before appt. Please put in orders if needed//sr

## 2015-02-16 NOTE — Telephone Encounter (Signed)
Routed to Dr.Smith to advise 

## 2015-02-17 ENCOUNTER — Encounter (HOSPITAL_COMMUNITY): Payer: Self-pay | Admitting: Cardiovascular Disease

## 2015-02-17 NOTE — Telephone Encounter (Signed)
Yes please order and echo: Dx is Prosthetic Aortic valve and AR

## 2015-02-17 NOTE — Telephone Encounter (Signed)
Order in HerminieEpic. Message routed to pcc to schedule

## 2015-03-14 ENCOUNTER — Other Ambulatory Visit (HOSPITAL_COMMUNITY): Payer: Self-pay

## 2015-03-24 ENCOUNTER — Ambulatory Visit (HOSPITAL_COMMUNITY): Payer: 59 | Attending: Cardiology

## 2015-03-24 DIAGNOSIS — Z954 Presence of other heart-valve replacement: Secondary | ICD-10-CM | POA: Diagnosis not present

## 2015-03-24 DIAGNOSIS — Z952 Presence of prosthetic heart valve: Secondary | ICD-10-CM

## 2015-03-24 NOTE — Progress Notes (Signed)
2D Echo completed. 03/24/2015 

## 2015-03-29 ENCOUNTER — Telehealth: Payer: Self-pay

## 2015-03-29 NOTE — Telephone Encounter (Signed)
pt aware of echo results.Everything looks normal.pt verbalized understanding.

## 2015-03-29 NOTE — Telephone Encounter (Signed)
-----   Message from Lyn RecordsHenry W Smith, MD sent at 03/24/2015  6:03 PM EDT ----- Everything looks normal

## 2015-04-18 ENCOUNTER — Ambulatory Visit (INDEPENDENT_AMBULATORY_CARE_PROVIDER_SITE_OTHER): Payer: 59 | Admitting: Interventional Cardiology

## 2015-04-18 ENCOUNTER — Encounter: Payer: Self-pay | Admitting: Interventional Cardiology

## 2015-04-18 VITALS — BP 124/66 | HR 58 | Ht 63.0 in | Wt 138.4 lb

## 2015-04-18 DIAGNOSIS — Z954 Presence of other heart-valve replacement: Secondary | ICD-10-CM

## 2015-04-18 DIAGNOSIS — R0789 Other chest pain: Secondary | ICD-10-CM

## 2015-04-18 DIAGNOSIS — Z953 Presence of xenogenic heart valve: Secondary | ICD-10-CM

## 2015-04-18 DIAGNOSIS — I359 Nonrheumatic aortic valve disorder, unspecified: Secondary | ICD-10-CM

## 2015-04-18 DIAGNOSIS — R072 Precordial pain: Secondary | ICD-10-CM

## 2015-04-18 DIAGNOSIS — I351 Nonrheumatic aortic (valve) insufficiency: Secondary | ICD-10-CM

## 2015-04-18 NOTE — Progress Notes (Signed)
Cardiology Office Note   Date:  04/18/2015   ID:  Kayla BullocksMitrina A Archer, DOB 10-20-1966, MRN [id number]  PCP:  No primary care provider on file.  Cardiologist:   Kayla Archer,Yan Pankratz W, MD   Chief Complaint  Patient presents with  . Aortic Regurgitation      History of Present Illness: Kayla Archer is a 49 y.o. female who presents for bowel prosthetic aortic valve for aortic stenosis [facility] in 2005 [facility].  She is doing well. She has no cardiopulmonary complaints. Recent echocardiogram demonstrated bile mitral regurgitation, normal LV size, and normal systolic function. She works and has no limitations due to dyspnea or fatigue. She denies chills and fever.    Past Medical History  Diagnosis Date  . PONV (postoperative nausea and vomiting)     plus hypotension  . Medical history non-contributory     see valve rep-  . Heart murmur   . Cartilage tear     rt knee    Past Surgical History  Procedure Laterality Date  . Cardiac valve replacement  04    thoracic aortic aneurysm plus valve  . Knee arthroscopy Right 12/28/2013    Procedure: ARTHROSCOPY KNEE;  Surgeon: Dannielle HuhSteve Lucey, MD;  [facility];  Service: Orthopedics;  Laterality: Right;     Current Outpatient Prescriptions  Medication Sig Dispense Refill  . ibuprofen (ADVIL,MOTRIN) 200 MG tablet Take 400 mg by mouth every 8 (eight) hours as needed for headache or mild pain.     No current facility-administered medications for this visit.    Allergies:   Review of patient's allergies indicates no known allergies.    Social History:  The patient  reports that she has never smoked. She does not have any smokeless tobacco history on file. She reports that she drinks about 1.8 oz of alcohol per week. She reports that she does not use illicit drugs.   Family History:  The patient's family history includes Diabetes type II in her mother, sister, and sister; Healthy in her brother; Heart attack in her father;  Hypertension in her mother, sister, and sister; Other in her mother and sister; Stroke in her father.    ROS:  Please see the history of present illness.   Otherwise, review of systems are positive for overwork on her job but otherwise unremarkable.   All other systems are reviewed and negative.    PHYSICAL EXAM: VS:  BP 124/66 mmHg  Pulse 58  Ht 5\' 3"  (1.6 m)  Wt 138 lb 6.4 oz (62.778 kg)  BMI 24.52 kg/m2 , BMI Body mass index is 24.52 kg/(m^2). GEN: Well nourished, well developed, in no acute distress HEENT: normal Neck: no JVD, carotid bruits, or masses Cardiac: There is a 2/6 systolic murmur and a 2 to 3/6 decrescendo murmur at the right upper sternal border that radiates along the left mid sternal border. No murmurs of mitral regurgitation is heard. RRR, no rubs, or gallops,no edema . Respiratory:  clear to auscultation bilaterally, normal work of breathing GI: soft, nontender, nondistended, + BS MS: no deformity or atrophy Skin: warm and dry, no rash Neuro:  Strength and sensation are intact Psych: euthymic mood, full affect   EKG:  EKG is ordered today. The ekg ordered today demonstrates sinus rhythm with poor R-wave progression. No change compared to prior   Recent Labs: No results found for requested labs within last 365 days.    Lipid Panel No results found for: CHOL, TRIG, HDL,  CHOLHDL, VLDL, LDLCALC, LDLDIRECT    Wt Readings from Last 3 Encounters:  04/18/15 138 lb 6.4 oz (62.778 kg)  11/08/14 134 lb (60.782 kg)  11/08/14 134 lb (60.782 kg)      Other studies Reviewed: Additional studies/ records that were reviewed today include: . Review of the above records demonstrates:   ASSESSMENT AND PLAN:  Aortic regurgitation: Moderate by exam and mild by echocardiography. LV size and function is normal.  S/P aortic valve replacement with bioprosthetic valve: Bioprosthetic aortic valve and conduit  Mid sternal chest pain: Musculoskeletal chest pain status post  surgery has resolved.     Current medicines are reviewed at length with the patient today.  The patient does not have concerns regarding medicines.  The following changes have been made:  no change  Labs/ tests ordered today include:   Orders Placed This Encounter  Procedures  . EKG 12-Lead  . Echocardiogram     Disposition:   FU with HS in 1 year  Signed, Kayla Archer,Leviathan Macera W, MD  04/18/2015 2:52 PM    [facility] [address], [place], [place]  [address] Phone: [phone]) [phone]; Fax: [phone]

## 2015-04-18 NOTE — Patient Instructions (Signed)
Medication Instructions:  Your physician recommends that you continue on your current medications as directed. Please refer to the Current Medication list given to you today.   Labwork: None   Testing/Procedures: Your physician has requested that you have an echocardiogram. Echocardiography is a painless test that uses sound waves to create images of your heart. It provides your doctor with information about the size and shape of your heart and how well your heart's chambers and valves are working. This procedure takes approximately one hour. There are no restrictions for this procedure. (To be scheduled in April 2017)   Follow-Up: Your physician wants you to follow-up in: 1 year with Dr.Smith You will receive a reminder letter in the mail two months in advance. If you don't receive a letter, please call our office to schedule the follow-up appointment.   Any Other Special Instructions Will Be Listed Below (If Applicable).   

## 2015-06-30 ENCOUNTER — Telehealth: Payer: Self-pay | Admitting: Interventional Cardiology

## 2015-06-30 NOTE — Telephone Encounter (Signed)
At work late last night and suddenly she became very dizzy, room spinning, nausea, felt like she was going to pass out.  Was alone in the locked office building and knew if she called  that they could not get in.  Called her husband who has a key to the building and he came and drove her home.  Symptoms lasted about 2 hours.  Also felt very hot with chills, speech didn't feel right.  This morning feels exhausted with light headedness and nausea.  Headache,  No loss of movement, speech clear.    Mentions that she played tennis yesterday morning and the evening before.  She tried to stay hydrated.  Does not know what her blood pressure and heart rate was or is.  Felt her AVR sounded find when she was laying on her side  Patient does not have a PCP.  Discussed with Cassie Freer APP PA-C  Instructed patient to go to the urgent care to be evaluated.

## 2015-06-30 NOTE — Telephone Encounter (Signed)
Pt c/o Syncope: STAT if syncope occurred within 30 minutes and pt complains of lightheadedness High Priority if episode of passing out, completely, today or in last 24 hours   1. Did you pass out today? No  2. When is the last time you passed out? Did not pass out completely, Last night pt fell to the floor and managed to stay calm enough and oriented enough to make a phone call.  3. Has this occurred multiple times? One time  4. Did you have any symptoms prior to passing out? Nausea, vomiting, dizziness, and chills 5.

## 2015-07-03 NOTE — Telephone Encounter (Signed)
Noted  

## 2015-07-13 ENCOUNTER — Telehealth: Payer: Self-pay

## 2015-07-13 DIAGNOSIS — R55 Syncope and collapse: Secondary | ICD-10-CM

## 2015-07-13 NOTE — Telephone Encounter (Signed)
Lmtcb. Called pt to give Dr.Smith's recommendation. Pt needs to wear a 30day monitor dx Syncope

## 2015-07-13 NOTE — Telephone Encounter (Signed)
Pt agreeable with Dr.Smith's recommendation to wear a 30 day event monitor. Adv a scheduler from our office will call her to schedule a monitor appt. Pt verbalized understanding.

## 2015-07-15 ENCOUNTER — Ambulatory Visit (INDEPENDENT_AMBULATORY_CARE_PROVIDER_SITE_OTHER): Payer: 59

## 2015-07-15 DIAGNOSIS — R55 Syncope and collapse: Secondary | ICD-10-CM | POA: Diagnosis not present

## 2015-10-01 ENCOUNTER — Emergency Department (HOSPITAL_COMMUNITY)
Admission: EM | Admit: 2015-10-01 | Discharge: 2015-10-01 | Disposition: A | Discharge status: Home / Self Care | Payer: Managed Care, Other (non HMO) | Attending: Emergency Medicine | Admitting: Emergency Medicine

## 2015-10-01 ENCOUNTER — Emergency Department (HOSPITAL_COMMUNITY): Payer: Managed Care, Other (non HMO)

## 2015-10-01 ENCOUNTER — Encounter (HOSPITAL_COMMUNITY): Payer: Self-pay

## 2015-10-01 DIAGNOSIS — G8929 Other chronic pain: Secondary | ICD-10-CM | POA: Diagnosis not present

## 2015-10-01 DIAGNOSIS — M79641 Pain in right hand: Secondary | ICD-10-CM | POA: Diagnosis not present

## 2015-10-01 DIAGNOSIS — Z9889 Other specified postprocedural states: Secondary | ICD-10-CM | POA: Insufficient documentation

## 2015-10-01 DIAGNOSIS — M25552 Pain in left hip: Secondary | ICD-10-CM | POA: Diagnosis present

## 2015-10-01 DIAGNOSIS — Z8619 Personal history of other infectious and parasitic diseases: Secondary | ICD-10-CM | POA: Insufficient documentation

## 2015-10-01 DIAGNOSIS — Z792 Long term (current) use of antibiotics: Secondary | ICD-10-CM | POA: Diagnosis not present

## 2015-10-01 DIAGNOSIS — M25522 Pain in left elbow: Secondary | ICD-10-CM | POA: Diagnosis not present

## 2015-10-01 DIAGNOSIS — M545 Low back pain: Secondary | ICD-10-CM | POA: Diagnosis not present

## 2015-10-01 DIAGNOSIS — M25511 Pain in right shoulder: Secondary | ICD-10-CM | POA: Diagnosis not present

## 2015-10-01 DIAGNOSIS — M79642 Pain in left hand: Secondary | ICD-10-CM | POA: Diagnosis not present

## 2015-10-01 DIAGNOSIS — M25521 Pain in right elbow: Secondary | ICD-10-CM | POA: Insufficient documentation

## 2015-10-01 DIAGNOSIS — Z87828 Personal history of other (healed) physical injury and trauma: Secondary | ICD-10-CM | POA: Diagnosis not present

## 2015-10-01 DIAGNOSIS — M2550 Pain in unspecified joint: Secondary | ICD-10-CM

## 2015-10-01 DIAGNOSIS — M25551 Pain in right hip: Secondary | ICD-10-CM | POA: Insufficient documentation

## 2015-10-01 DIAGNOSIS — M25512 Pain in left shoulder: Secondary | ICD-10-CM | POA: Diagnosis not present

## 2015-10-01 DIAGNOSIS — R011 Cardiac murmur, unspecified: Secondary | ICD-10-CM | POA: Insufficient documentation

## 2015-10-01 LAB — URINALYSIS, ROUTINE W REFLEX MICROSCOPIC
Bilirubin Urine: NEGATIVE
Glucose, UA: NEGATIVE mg/dL
Hgb urine dipstick: NEGATIVE
Ketones, ur: NEGATIVE mg/dL
Leukocytes, UA: NEGATIVE
Nitrite: NEGATIVE
Protein, ur: NEGATIVE mg/dL
Specific Gravity, Urine: 1.014 (ref 1.005–1.030)
Urobilinogen, UA: 0.2 mg/dL (ref 0.0–1.0)
pH: 5.5 (ref 5.0–8.0)

## 2015-10-01 LAB — CBC WITH DIFFERENTIAL/PLATELET
Basophils Absolute: 0 10*3/uL (ref 0.0–0.1)
Basophils Relative: 0 %
Eosinophils Absolute: 0.2 10*3/uL (ref 0.0–0.7)
Eosinophils Relative: 2 %
HEMATOCRIT: 39.4 % (ref 36.0–46.0)
HEMOGLOBIN: 13.3 g/dL (ref 12.0–15.0)
LYMPHS ABS: 1.9 10*3/uL (ref 0.7–4.0)
Lymphocytes Relative: 22 %
MCH: 32 pg (ref 26.0–34.0)
MCHC: 33.8 g/dL (ref 30.0–36.0)
MCV: 94.9 fL (ref 78.0–100.0)
Monocytes Absolute: 0.6 10*3/uL (ref 0.1–1.0)
Monocytes Relative: 7 %
NEUTROS PCT: 69 %
Neutro Abs: 5.9 10*3/uL (ref 1.7–7.7)
Platelets: 229 10*3/uL (ref 150–400)
RBC: 4.15 MIL/uL (ref 3.87–5.11)
RDW: 12.3 % (ref 11.5–15.5)
WBC: 8.6 10*3/uL (ref 4.0–10.5)

## 2015-10-01 LAB — COMPREHENSIVE METABOLIC PANEL WITH GFR
ALT: 24 U/L (ref 14–54)
AST: 23 U/L (ref 15–41)
Albumin: 3.9 g/dL (ref 3.5–5.0)
Alkaline Phosphatase: 74 U/L (ref 38–126)
Anion gap: 6 (ref 5–15)
BUN: 13 mg/dL (ref 6–20)
CO2: 25 mmol/L (ref 22–32)
Calcium: 9.3 mg/dL (ref 8.9–10.3)
Chloride: 106 mmol/L (ref 101–111)
Creatinine, Ser: 0.71 mg/dL (ref 0.44–1.00)
GFR calc Af Amer: >60 mL/min
GFR calc non Af Amer: >60 mL/min
Glucose, Bld: 129 mg/dL — ABNORMAL HIGH (ref 65–99)
Potassium: 4 mmol/L (ref 3.5–5.1)
Sodium: 137 mmol/L (ref 135–145)
Total Bilirubin: 0.2 mg/dL — ABNORMAL LOW (ref 0.3–1.2)
Total Protein: 6.6 g/dL (ref 6.5–8.1)

## 2015-10-01 LAB — SEDIMENTATION RATE: Sed Rate: 8 mm/hr (ref 0–22)

## 2015-10-01 LAB — RAPID STREP SCREEN (MED CTR MEBANE ONLY): STREPTOCOCCUS, GROUP A SCREEN (DIRECT): NEGATIVE

## 2015-10-01 MED ORDER — OXYCODONE-ACETAMINOPHEN 5-325 MG PO TABS: 1.0000 | ORAL_TABLET | Freq: Once | ORAL | Status: DC

## 2015-10-01 MED ORDER — ONDANSETRON HCL 4 MG/2ML IJ SOLN: 4.0000 mg | Freq: Once | INTRAMUSCULAR | Status: DC

## 2015-10-01 NOTE — ED Notes (Signed)
PA at bedside.

## 2015-10-01 NOTE — Discharge Instructions (Signed)
Pain Without a Known Cause °WHAT IS PAIN WITHOUT A KNOWN CAUSE? °Pain can occur in any part of the body and can range from mild to severe. Sometimes no cause can be found for why you are having pain. Some types of pain that can occur without a known cause include:  °· Headache. °· Back pain. °· Abdominal pain. °· Neck pain. °HOW IS PAIN WITHOUT A KNOWN CAUSE DIAGNOSED?  °Your health care provider will try to find the cause of your pain. This may include: °· Physical exam. °· Medical history. °· Blood tests. °· Urine tests. °· X-rays. °If no cause is found, your health care provider may diagnose you with pain without a known cause.  °IS THERE TREATMENT FOR PAIN WITHOUT A CAUSE?  °Treatment depends on the kind of pain you have. Your health care provider may prescribe medicines to help relieve your pain.  °WHAT CAN I DO AT HOME FOR MY PAIN?  °· Take medicines only as directed by your health care provider. °· Stop any activities that cause pain. During periods of severe pain, bed rest may help. °· Try to reduce your stress with activities such as yoga or meditation. Talk to your health care provider for other stress-reducing activity recommendations. °· Exercise regularly, if approved by your health care provider. °· Eat a healthy diet that includes fruits and vegetables. This may improve pain. Talk to your health care provider if you have any questions about your diet. °WHAT IF MY PAIN DOES NOT GET BETTER?  °If you have a painful condition and no reason can be found for the pain or the pain gets worse, it is important to follow up with your health care provider. It may be necessary to repeat tests and look further for a possible cause.  °  °This information is not intended to replace advice given to you by your health care provider. Make sure you discuss any questions you have with your health care provider. °  °Document Released: 08/21/2001 Document Revised: 12/17/2014 Document Reviewed: 04/13/2014 °Elsevier  Interactive Patient Education ©2016 [employer] Inc. ° °

## 2015-10-01 NOTE — ED Notes (Signed)
Patient transported to X-ray 

## 2015-10-01 NOTE — ED Provider Notes (Addendum)
CSN: [id number]     Arrival date & time 10/01/15  2019 History   First MD Initiated Contact with Patient 10/01/15 2029     Chief Complaint  Patient presents with  . Joint Pain     (Consider location/radiation/quality/duration/timing/severity/associated sxs/prior Treatment) HPI   Patient to the ER with complaints of pain all over. She has PMH of cardiac valve replacement (thoracic aortic aneurysm plus valve), heart murmur, chronic knee pain. She had a dental procedure done Oct 12 and October 14 and was on prophylactic abx. She started to feel poorly with pain and general body aches after the procedure on Oct 12 which hs been progressively getting worse. She has seen her dentist twice and went to the Urgent Care since that time. The dentist reports that there is not complications or signs of infection to her tooth. She is now having very minimal pain to her jaw but reports significant pain to her hips, low back, hands, elbows and shoulders. 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 She has not seen a tick on her but has been outside in the woods and in her yard. She has not had any coughing, sore throat, chest pain, abdominal pain, nausea, vomiting, diarrhea, rash, focal weakness, aphagia, confusion, change in vision, headache, fevers. She reports she does not want any pain medications but is concerned about her replaced valve and with why she is feeling severe pain over the past two weeks.  Past Medical History  Diagnosis Date  . PONV (postoperative nausea and vomiting)     plus hypotension  . Medical history non-contributory     see valve rep-  . Heart murmur   . Cartilage tear     rt knee   Past Surgical History  Procedure Laterality Date  . Cardiac valve replacement  04    thoracic aortic aneurysm plus valve  . Knee arthroscopy Right 12/28/2013    Procedure: ARTHROSCOPY KNEE;  Surgeon: Dannielle HuhSteve Lucey, MD;  [facility];  Service: Orthopedics;  Laterality: Right;   Family  History  Problem Relation Age of Onset  . Hypertension Mother   . Diabetes type II Mother   . Other Mother     NONHODGKINS LYMPHOMA  . Stroke Father   . Heart attack Father   . Other Sister     PRIMARY PULMONARY HTN  . Healthy Brother   . Hypertension Sister   . Diabetes type II Sister   . Hypertension Sister   . Diabetes type II Sister    Social History  Substance Use Topics  . Smoking status: Never Smoker   . Smokeless tobacco: None  . Alcohol Use: 1.8 oz/week    3 Glasses of wine per week   OB History    No data available     Review of Systems  10 Systems reviewed and are negative for acute change except as noted in the HPI.     Allergies  Review of patient's allergies indicates no known allergies.  Home Medications   Prior to Admission medications   Medication Sig Start Date End Date Taking? Authorizing Provider  acetaminophen (TYLENOL) 500 MG tablet Take 500 mg by mouth every 6 (six) hours as needed for moderate pain.   Yes Historical Provider, MD  amoxicillin (AMOXIL) 500 MG capsule Take 2,000 mg by mouth once.   Yes Historical Provider, MD  ibuprofen (ADVIL,MOTRIN) 200 MG tablet Take 400 mg by mouth every 6 (six) hours as needed for moderate pain.  Yes Historical Provider, MD  zolpidem (AMBIEN) 5 MG tablet Take 5-10 mg by mouth at bedtime as needed for sleep.   Yes Historical Provider, MD   BP 119/47 mmHg  Pulse 60  Temp(Src) 97.8 F (36.6 C) (Oral)  Resp 12  Ht  (1.6 m)  Wt 137 lb (62.143 kg)  BMI 24.27 kg/m2  SpO2 97% Physical Exam  Constitutional: She appears well-developed and well-nourished. No distress.  HENT:  Head: Normocephalic and atraumatic.  Right Ear: Tympanic membrane and ear canal normal.  Left Ear: Tympanic membrane and ear canal normal.  Mouth/Throat: Uvula is midline and oropharynx is clear and moist.  Eyes: Pupils are equal, round, and reactive to light.  Neck: Normal range of motion. Neck supple.  Cardiovascular: Normal  rate and regular rhythm.   + systolic murmur  Pulmonary/Chest: Effort normal and breath sounds normal. She has no decreased breath sounds. She has no wheezes.  Abdominal: Soft.  No abdomen tenderness or distention  Musculoskeletal:  No lower extremity swelling  Neurological: She is alert.  Skin: Skin is warm and dry.  No rash  Nursing note and vitals reviewed.   ED Course  Procedures (including critical care time) Labs Review Labs Reviewed  COMPREHENSIVE METABOLIC PANEL - Abnormal; Notable for the following:    Glucose, Bld 129 (*)    Total Bilirubin 0.2 (*)    All other components within normal limits  URINALYSIS, ROUTINE W REFLEX MICROSCOPIC (NOT AT [facility]) - Abnormal; Notable for the following:    APPearance HAZY (*)    All other components within normal limits  RAPID STREP SCREEN (NOT AT [facility])  CULTURE, GROUP A STREP  CBC WITH DIFFERENTIAL/PLATELET  SEDIMENTATION RATE  B. BURGDORFI ANTIBODIES  C-REACTIVE PROTEIN    Imaging Review Dg Chest 2 View  10/01/2015  CLINICAL DATA:  Joint pains.  Recent dental surgical procedure. EXAM: CHEST  2 VIEW COMPARISON:  12/23/2013 FINDINGS: There is a prosthetic aortic valve. Surgical clips in the upper mediastinum, unchanged. The cardiomediastinal contours are normal. The lungs are clear. Pulmonary vasculature is normal. No consolidation, pleural effusion, or pneumothorax. No acute osseous abnormalities are seen. IMPRESSION: No acute pulmonary process. Electronically Signed   By: Rubye Oaks M.D.   On: 10/01/2015 22:45   I have personally reviewed and evaluated these images and lab results as part of my medical decision-making.   EKG Interpretation   Date/Time:  Saturday October 01 2015 21:50:35 EDT Ventricular Rate:  58 PR Interval:  171 QRS Duration: 72 QT Interval:  434 QTC Calculation: 426 R Axis:   -2 Text Interpretation:  Ectopic atrial rhythm No acute changes Confirmed by  NANAVATI, MD, ANKIT [phone]) on 10/01/2015  10:32:46 PM     MDM   Final diagnoses:  Polyarthralgia   Normal EKG, normal chest xray, neg strep, normal UA, CMP and CBC.  The lyme titer and CRP are pending. The patient is requesting to go home and follow-up with her PCP.  Due to lab-work, EKG and vitals being unremarkable I will not force patient to sign out AMA as the lyme titer and CPR would not likely result in admission if abnormal. I will call her with results tomorrow morning during my shift. Or she can call her PCP for results. -- unknown etiology of patients multiple joint pains without any other associated symptoms at this time.  Medications - No data to display  49 y.o.Kayla Archer's medical screening exam was performed and I feel the patient has  had an appropriate workup for their chief complaint at this time and likelihood of emergent condition existing is low. They have been counseled on decision, discharge, follow up and which symptoms necessitate immediate return to the emergency department. They or their family verbally stated understanding and agreement with plan and discharged in stable condition.   Vital signs are stable at discharge. Filed Vitals:   10/01/15 2215  BP: 119/47  Pulse: 60  Temp:   Resp: [id number] PA-C 10/01/15 2341  Derwood Kaplan, MD 10/03/15 2123  Marlon Pel, PA-C 10/25/15 1610  Derwood Kaplan, MD 10/27/15 1351

## 2015-10-01 NOTE — ED Notes (Signed)
Pt reports all over joint pain. She says she had dental work done on Oct 12 and then an emergent root canal on the October 14th. Since then, she has developed generalized body aches that started on Wednesday night and joint pain has been progressively getting worse.

## 2015-10-02 LAB — C-REACTIVE PROTEIN: CRP: 0.5 mg/dL (ref <1.0)

## 2015-10-03 LAB — B. BURGDORFI ANTIBODIES: B burgdorferi Ab IgG+IgM: <0.91 {ISR} (ref 0.00–0.90)

## 2015-10-04 LAB — CULTURE, GROUP A STREP: Strep A Culture: NEGATIVE

## 2016-03-29 DIAGNOSIS — S8992XA Unspecified injury of left lower leg, initial encounter: Secondary | ICD-10-CM | POA: Insufficient documentation

## 2016-07-02 ENCOUNTER — Other Ambulatory Visit: Payer: Self-pay

## 2016-07-02 DIAGNOSIS — Z952 Presence of prosthetic heart valve: Secondary | ICD-10-CM

## 2016-07-25 ENCOUNTER — Ambulatory Visit (HOSPITAL_COMMUNITY): Payer: Managed Care, Other (non HMO) | Attending: Cardiology

## 2016-07-25 ENCOUNTER — Encounter (INDEPENDENT_AMBULATORY_CARE_PROVIDER_SITE_OTHER): Payer: Self-pay

## 2016-07-25 ENCOUNTER — Other Ambulatory Visit: Payer: Self-pay

## 2016-07-25 DIAGNOSIS — Z954 Presence of other heart-valve replacement: Secondary | ICD-10-CM

## 2016-07-25 DIAGNOSIS — Z953 Presence of xenogenic heart valve: Secondary | ICD-10-CM | POA: Diagnosis not present

## 2016-07-25 DIAGNOSIS — I352 Nonrheumatic aortic (valve) stenosis with insufficiency: Secondary | ICD-10-CM | POA: Insufficient documentation

## 2016-07-25 DIAGNOSIS — I359 Nonrheumatic aortic valve disorder, unspecified: Secondary | ICD-10-CM | POA: Diagnosis present

## 2016-07-25 DIAGNOSIS — Z952 Presence of prosthetic heart valve: Secondary | ICD-10-CM

## 2016-07-25 DIAGNOSIS — I071 Rheumatic tricuspid insufficiency: Secondary | ICD-10-CM | POA: Insufficient documentation

## 2016-07-30 NOTE — Progress Notes (Signed)
Cardiology Office Note    Date:  07/31/2016   ID:  Kayla Archer, DOB March 20, 1966, MRN [id number]  PCP:  No PCP Per Patient  Cardiologist: Lesleigh NoeHenry W Smith III, MD   Chief Complaint  Patient presents with  . Cardiac Valve Problem    History of Present Illness:  Kayla Archer is a 50 y.o. female follow-up of aortic valve replacement for bicuspid valve, bioprosthetic valve placed in 2004.  Heavy stress at work. One episode of chest pressure lasting approximately 5 minutes during a very stressful situation at work. No orthopnea, PND, palpitations, or syncope. She has not had syncope. She denies dyspnea. She played tennis last night without limitations. She has not noted any particular limitation compared to    Past Medical History:  Diagnosis Date  . Cartilage tear    rt knee  . Heart murmur   . Medical history non-contributory    see valve rep-  . PONV (postoperative nausea and vomiting)    plus hypotension    Past Surgical History:  Procedure Laterality Date  . 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 KNEE ARTHROSCOPY Right 12/28/2013   Procedure: ARTHROSCOPY KNEE;  Surgeon: Dannielle HuhSteve Lucey, MD;  [facility];  Service: Orthopedics;  Laterality: Right;    Current Medications: Outpatient Medications Prior to Visit  Medication Sig Dispense Refill  . acetaminophen (TYLENOL) 500 MG tablet Take 500 mg by mouth every 6 (six) hours as needed for moderate pain.    Marland Kitchen. amoxicillin (AMOXIL) 500 MG capsule Take 2,000 mg by mouth as needed (Prior to dental procedures).     Marland Kitchen. ibuprofen (ADVIL,MOTRIN) 200 MG tablet Take 400 mg by mouth every 6 (six) hours as needed for moderate pain.    Marland Kitchen. zolpidem (AMBIEN) 5 MG tablet Take 5-10 mg by mouth at bedtime as needed for sleep.     No facility-administered medications prior to visit.      Allergies:   Review of patient's allergies indicates no known allergies.   Social History   Social History  .  Marital status: Married    Spouse name: N/A  . Number of children: N/A  . Years of education: N/A   Social History Main Topics  . Smoking status: Never Smoker  . Smokeless tobacco: Never Used  . Alcohol use 1.8 oz/week    3 Glasses of wine per week  . Drug use: No  . Sexual activity: Not Asked   Other Topics Concern  . None   Social History Narrative  . None     Family History:  The patient's family history includes Diabetes type II in her mother, sister, and sister; Healthy in her brother; Heart attack in her father; Hypertension in her mother, sister, and sister; Other in her mother and sister; Stroke in her father.   ROS:   Please see the history of present illness.    Work stress.  All other systems reviewed and are negative.   PHYSICAL EXAM:   VS:  BP 120/70   Pulse 69   Ht 5\' 3"  (1.6 m)   Wt 140 lb (63.5 kg)   BMI 24.80 kg/m    GEN: Well nourished, well developed, in no acute distress  HEENT: normal  Neck: no JVD, carotid bruits, or masses Cardiac: RRR; There is a grade 4/6 crescendo decrescendo right upper sternal border and left midsternal border systolic aortic valve murmur as well as a 2 to 3/6 decrescendo diastolic  murmur of aortic regurgitation heard best at the left midsternal border. No rub, gallop or edema . Respiratory:  clear to auscultation bilaterally, normal work of breathing GI: soft, nontender, nondistended, + BS MS: no deformity or atrophy  Skin: warm and dry, no rash Neuro:  Alert and Oriented x 3, Strength and sensation are intact Psych: euthymic mood, full affect  Wt Readings from Last 3 Encounters:  07/31/16 140 lb (63.5 kg)  10/01/15 137 lb (62.1 kg)  04/18/15 138 lb 6.4 oz (62.8 kg)      Studies/Labs Reviewed:   EKG:  EKG  Not repeated.  Recent Labs: 10/01/2015: ALT 24; BUN 13; Creatinine, Ser 0.71; Hemoglobin 13.3; Platelets 229; Potassium 4.0; Sodium 137   Lipid Panel No results found for: CHOL, TRIG, HDL, CHOLHDL, VLDL,  LDLCALC, LDLDIRECT  Additional studies/ records that were reviewed today include:   Echocardiogram 07/25/2016: ------------------------------------------------------------------- Study Conclusions  - Left ventricle: The cavity size was normal. Systolic function was   vigorous. The estimated ejection fraction was in the range of 65%   to 70%. Wall motion was normal; there were no regional wall   motion abnormalities. Doppler parameters are consistent with   abnormal left ventricular relaxation (grade 1 diastolic   dysfunction). Doppler parameters are consistent with elevated   ventricular end-diastolic filling pressure. - Aortic valve: There was moderate stenosis. There was mild to   moderate regurgitation. Mean gradient (S): 33 mm Hg. Peak   gradient (S): 60 mm Hg. Valve area (VTI): 0.93 cm^2. Valve area   (Vmax): 0.74 cm^2. Valve area (Vmean): 0.8 cm^2. - Ascending aorta: The ascending aorta was normal in size. - Right ventricle: Systolic function was normal. - Right atrium: The atrium was normal in size. - Tricuspid valve: There was mild regurgitation. - Pulmonary arteries: Systolic pressure was within the normal   range. - Inferior vena cava: The vessel was normal in size. The   respirophasic diameter changes were in the normal range (= 50%),   consistent with normal central venous pressure. - Pericardium, extracardiac: There was no pericardial effusion.  Impressions:  - When compared to the prior study from 03/23/2016, the transaortic   gradients across the bioprosthetic aortic valve are now elevated.   Currently peak/mean 60/33 mmHg in the moderate aortic stenosis   range, previously normal with peak/mean 26/17 mmHg.   Leaflets have limited visualization, an additional evaluation   with TEE or a cardiac CT is recommended for evaluation of valve   thrombosis.   ASSESSMENT:    1. S/P aortic valve replacement with bioprosthetic valve   2. Aortic regurgitation       PLAN:  In order of problems listed above:  1. We had significant discussion concerning degeneration of the bioprosthetic aortic valve. There is significant increase in the transvalvular gradient. There is no definitive symptom at this point. LV size and function is stable. Plan clinical follow-up in 6 months. She does understand that this valve will eventually need to be replaced. We did not discuss options. 2. This problem is due to degeneration of her bioprosthetic aortic valve.    Medication Adjustments/Labs and Tests Ordered: Current medicines are reviewed at length with the patient today.  Concerns regarding medicines are outlined above.  Medication changes, Labs and Tests ordered today are listed in the Patient Instructions below. Patient Instructions  Medication Instructions:  Your physician recommends that you continue on your current medications as directed. Please refer to the Current Medication list given to you today.  Labwork: None ordered  Testing/Procedures: None ordered  Follow-Up: Your physician wants you to follow-up in:  You will receive a reminder letter in the mail two months in advance. If you don't receive a letter, please call our office to schedule the follow-up appointment.    Any Other Special Instructions Will Be Listed Below (If Applicable).     If you need a refill on your cardiac medications before your next appointment, please call your pharmacy.      Signed, Lesleigh NoeHenry W Smith III, MD  07/31/2016 9:22 AM    [facility] [address], [place], [place]  [address] Phone: [phone]; Fax: [phone]

## 2016-07-31 ENCOUNTER — Ambulatory Visit (INDEPENDENT_AMBULATORY_CARE_PROVIDER_SITE_OTHER): Payer: Managed Care, Other (non HMO) | Admitting: Interventional Cardiology

## 2016-07-31 ENCOUNTER — Encounter (INDEPENDENT_AMBULATORY_CARE_PROVIDER_SITE_OTHER): Payer: Self-pay

## 2016-07-31 ENCOUNTER — Encounter: Payer: Self-pay | Admitting: Interventional Cardiology

## 2016-07-31 VITALS — BP 120/70 | HR 69 | Ht 63.0 in | Wt 140.0 lb

## 2016-07-31 DIAGNOSIS — I351 Nonrheumatic aortic (valve) insufficiency: Secondary | ICD-10-CM

## 2016-07-31 DIAGNOSIS — Z953 Presence of xenogenic heart valve: Secondary | ICD-10-CM

## 2016-07-31 DIAGNOSIS — Z954 Presence of other heart-valve replacement: Secondary | ICD-10-CM | POA: Diagnosis not present

## 2016-07-31 NOTE — Patient Instructions (Addendum)
Medication Instructions:  Your physician recommends that you continue on your current medications as directed. Please refer to the Current Medication list given to you today.   Labwork: None ordered  Testing/Procedures: None ordered  Follow-Up: Your physician wants you to follow-up in: 6 MONTHS WITH DR. Marlou StarksSMITH  You will receive a reminder letter in the mail two months in advance. If you don't receive a letter, please call our office to schedule the follow-up appointment.    Any Other Special Instructions Will Be Listed Below (If Applicable). Call our office if you have chest pain, shortness of breath, or syncope.    If you need a refill on your cardiac medications before your next appointment, please call your pharmacy.

## 2016-09-07 ENCOUNTER — Emergency Department (HOSPITAL_COMMUNITY)
Admission: EM | Admit: 2016-09-07 | Discharge: 2016-09-07 | Disposition: A | Discharge status: Home / Self Care | Payer: Managed Care, Other (non HMO) | Attending: Dermatology | Admitting: Dermatology

## 2016-09-07 ENCOUNTER — Telehealth: Payer: Self-pay | Admitting: Interventional Cardiology

## 2016-09-07 ENCOUNTER — Emergency Department (HOSPITAL_COMMUNITY): Payer: Managed Care, Other (non HMO)

## 2016-09-07 ENCOUNTER — Encounter (HOSPITAL_COMMUNITY): Payer: Self-pay | Admitting: *Deleted

## 2016-09-07 DIAGNOSIS — R079 Chest pain, unspecified: Secondary | ICD-10-CM | POA: Insufficient documentation

## 2016-09-07 DIAGNOSIS — Z5321 Procedure and treatment not carried out due to patient leaving prior to being seen by health care provider: Secondary | ICD-10-CM | POA: Diagnosis not present

## 2016-09-07 LAB — CBC
HCT: 41 % (ref 36.0–46.0)
Hemoglobin: 13.3 g/dL (ref 12.0–15.0)
MCH: 31.5 pg (ref 26.0–34.0)
MCHC: 32.4 g/dL (ref 30.0–36.0)
MCV: 97.2 fL (ref 78.0–100.0)
PLATELETS: 231 10*3/uL (ref 150–400)
RBC: 4.22 MIL/uL (ref 3.87–5.11)
RDW: 12.5 % (ref 11.5–15.5)
WBC: 8.5 10*3/uL (ref 4.0–10.5)

## 2016-09-07 LAB — BASIC METABOLIC PANEL
Anion gap: 8 (ref 5–15)
BUN: 11 mg/dL (ref 6–20)
CALCIUM: 9.5 mg/dL (ref 8.9–10.3)
CO2: 25 mmol/L (ref 22–32)
Chloride: 106 mmol/L (ref 101–111)
Creatinine, Ser: 0.66 mg/dL (ref 0.44–1.00)
Glucose, Bld: 103 mg/dL — ABNORMAL HIGH (ref 65–99)
Potassium: 4.1 mmol/L (ref 3.5–5.1)
SODIUM: 139 mmol/L (ref 135–145)

## 2016-09-07 LAB — I-STAT TROPONIN, ED: TROPONIN I, POC: 0.03 ng/mL (ref 0.00–0.08)

## 2016-09-07 NOTE — ED Triage Notes (Signed)
Pt reports intermittent dull chest pains for past week, became more severe last night. Had recent episode of n/v and diaphoresis with the pain. Denies sob.

## 2016-09-07 NOTE — ED Notes (Signed)
Called pt for treatment room and no answer.  Unable to locate pt.  Called pt's cell phone number and she states she left.  States she will follow-up with Dr. Katrinka BlazingSmith on Monday and get test results from MyChart.  Encouraged pt to return if needed.

## 2016-09-07 NOTE — Telephone Encounter (Signed)
Pt c/o of Chest Pain: STAT if CP now or developed within 24 hours  1. Are you having CP right now? yes  2. Are you experiencing any other symptoms (ex. SOB, nausea, vomiting, sweating)? no  3. How long have you been experiencing CP? 09-06-16  4. Is your CP continuous or coming and going? Coming and going 5. Have you taken Nitroglycerin? No  ?

## 2016-09-07 NOTE — Telephone Encounter (Signed)
Spoke with pt and she states that she has been having intermittent CP x 1 week.  States the other night she had an episode of nausea and said she could have vomited but she held back.  Yesterday had an episode of sharp CP, slightly left of center of chest.  Today she states it feels like it's sitting in the center of her chest.  Pt does not have nitro.  Pt has been taking ASA 81mg  for the last week d/t the CP.  Denies sweating, blurred vision, or palps.  C/O SOB with exertion and HA.  Had pt take vitals: Rt arm- 154/93, 74 and Lt arm 141/78, 69.  Spoke with Dr. Katrinka BlazingSmith and he asked that pt go to ER for evaluation.  Spoke with pt and advised her of recommendation.  Pt verbalized understanding and was in agreement with this plan.

## 2016-09-28 ENCOUNTER — Telehealth: Payer: Self-pay | Admitting: Interventional Cardiology

## 2016-09-28 NOTE — Telephone Encounter (Signed)
New message      Talk to the nurse about antibiotic prior to a minor procedure next thurs because of her valve.  Please call

## 2016-09-28 NOTE — Telephone Encounter (Signed)
Attempted to call pt back.  VM came on and stated that mailbox was full.  Will try again later.

## 2016-10-01 NOTE — Telephone Encounter (Signed)
Spoke with pt and she states that she is having a Radial Reduction done on Thurs.  Pt states that she wasn't sure if she needed po ABT prior to procedure or if they would give IV ABT. Advised pt for something like that they should be giving IV ABT but I recommended that she call surgeon's office to be sure.  Pt states that office is aware of her valve.  Advised pt to call our office if she needs anything from us.  Pt verbalized understanding and was appreciative for assistance.

## 2016-10-04 ENCOUNTER — Ambulatory Visit (HOSPITAL_COMMUNITY)
Admission: RE | Admit: 2016-10-04 | Discharge: 2016-10-04 | Disposition: A | Discharge status: Home / Self Care | Payer: Managed Care, Other (non HMO) | Source: Ambulatory Visit | Attending: Obstetrics and Gynecology | Admitting: Obstetrics and Gynecology

## 2016-10-04 ENCOUNTER — Encounter (HOSPITAL_COMMUNITY): Payer: Self-pay

## 2016-10-04 ENCOUNTER — Ambulatory Visit (HOSPITAL_COMMUNITY): Payer: Managed Care, Other (non HMO) | Admitting: Anesthesiology

## 2016-10-04 ENCOUNTER — Encounter (HOSPITAL_COMMUNITY)
Admission: RE | Disposition: A | Discharge status: Home / Self Care | Payer: Self-pay | Source: Ambulatory Visit | Attending: Obstetrics and Gynecology

## 2016-10-04 DIAGNOSIS — N9069 Other specified hypertrophy of vulva: Secondary | ICD-10-CM | POA: Diagnosis present

## 2016-10-04 DIAGNOSIS — R011 Cardiac murmur, unspecified: Secondary | ICD-10-CM | POA: Diagnosis not present

## 2016-10-04 DIAGNOSIS — Z952 Presence of prosthetic heart valve: Secondary | ICD-10-CM | POA: Insufficient documentation

## 2016-10-04 HISTORY — PX: LABIOPLASTY: SHX1900

## 2016-10-04 SURGERY — LABIAPLASTY, VULVA
Anesthesia: Choice | Site: Vulva | Laterality: Bilateral

## 2016-10-04 MED ORDER — FENTANYL CITRATE (PF) 100 MCG/2ML IJ SOLN
INTRAMUSCULAR | Status: AC
  Filled 2016-10-04: qty 2

## 2016-10-04 MED ORDER — ONDANSETRON HCL 4 MG/2ML IJ SOLN
INTRAMUSCULAR | Status: DC | PRN
  Administered 2016-10-04: 4 mg via INTRAVENOUS

## 2016-10-04 MED ORDER — MIDAZOLAM HCL 2 MG/2ML IJ SOLN
INTRAMUSCULAR | Status: AC
  Filled 2016-10-04: qty 2

## 2016-10-04 MED ORDER — BACITRACIN-NEOMYCIN-POLYMYXIN 400-5-5000 EX OINT
TOPICAL_OINTMENT | CUTANEOUS | Status: AC
  Filled 2016-10-04: qty 1

## 2016-10-04 MED ORDER — LIDOCAINE HCL (CARDIAC) 20 MG/ML IV SOLN
INTRAVENOUS | Status: AC
  Filled 2016-10-04: qty 5

## 2016-10-04 MED ORDER — OXYCODONE-ACETAMINOPHEN 5-325 MG PO TABS: 1.0000 | ORAL_TABLET | ORAL | 0 refills | Status: DC | PRN

## 2016-10-04 MED ORDER — SODIUM CHLORIDE 0.9 % IV SOLN
3.0000 g | INTRAVENOUS | Status: AC
  Administered 2016-10-04: 3 g via INTRAVENOUS
  Filled 2016-10-04: qty 3

## 2016-10-04 MED ORDER — FENTANYL CITRATE (PF) 100 MCG/2ML IJ SOLN
INTRAMUSCULAR | Status: DC | PRN
  Administered 2016-10-04 (×2): 50 ug via INTRAVENOUS

## 2016-10-04 MED ORDER — MIDAZOLAM HCL 5 MG/5ML IJ SOLN
INTRAMUSCULAR | Status: DC | PRN
  Administered 2016-10-04: 2 mg via INTRAVENOUS

## 2016-10-04 MED ORDER — LIDOCAINE HCL (CARDIAC) 20 MG/ML IV SOLN
INTRAVENOUS | Status: DC | PRN
  Administered 2016-10-04: 40 mg via INTRAVENOUS

## 2016-10-04 MED ORDER — IBUPROFEN 600 MG PO TABS
ORAL_TABLET | ORAL | Status: AC
  Filled 2016-10-04: qty 1

## 2016-10-04 MED ORDER — LIDOCAINE HCL 1 % IJ SOLN
INTRAMUSCULAR | Status: AC
  Filled 2016-10-04: qty 20

## 2016-10-04 MED ORDER — SCOPOLAMINE 1 MG/3DAYS TD PT72
1.0000 | MEDICATED_PATCH | Freq: Once | TRANSDERMAL | Status: DC
  Administered 2016-10-04: 1.5 mg via TRANSDERMAL

## 2016-10-04 MED ORDER — LIDOCAINE HCL 1 % IJ SOLN
INTRAMUSCULAR | Status: DC | PRN
  Administered 2016-10-04: 10 mL
  Administered 2016-10-04: 1 mL

## 2016-10-04 MED ORDER — BACIT-POLY-NEO HC 1 % EX OINT
TOPICAL_OINTMENT | CUTANEOUS | Status: DC | PRN
  Administered 2016-10-04: 1 via TOPICAL

## 2016-10-04 MED ORDER — PROPOFOL 10 MG/ML IV BOLUS
INTRAVENOUS | Status: AC
  Filled 2016-10-04: qty 20

## 2016-10-04 MED ORDER — ONDANSETRON HCL 4 MG/2ML IJ SOLN
INTRAMUSCULAR | Status: AC
  Filled 2016-10-04: qty 2

## 2016-10-04 MED ORDER — PROPOFOL 10 MG/ML IV BOLUS
INTRAVENOUS | Status: DC | PRN
  Administered 2016-10-04 (×7): 50 mg via INTRAVENOUS

## 2016-10-04 MED ORDER — LACTATED RINGERS IV SOLN
INTRAVENOUS | Status: DC
  Administered 2016-10-04: 14:00:00 via INTRAVENOUS
  Administered 2016-10-04: 125 mL/h via INTRAVENOUS

## 2016-10-04 MED ORDER — LIDOCAINE 5 % EX OINT
1.0000 | TOPICAL_OINTMENT | CUTANEOUS | 0 refills | Status: DC | PRN
Start: 2016-10-04 — End: 2016-11-20

## 2016-10-04 MED ORDER — LACTATED RINGERS IV SOLN: INTRAVENOUS | Status: DC

## 2016-10-04 MED ORDER — IBUPROFEN 200 MG PO TABS: 600.0000 mg | ORAL_TABLET | Freq: Four times a day (QID) | ORAL | 0 refills | Status: DC | PRN

## 2016-10-04 MED ORDER — FENTANYL CITRATE (PF) 100 MCG/2ML IJ SOLN
25.0000 ug | INTRAMUSCULAR | Status: DC | PRN
  Administered 2016-10-04: 25 ug via INTRAVENOUS

## 2016-10-04 MED ORDER — DEXAMETHASONE SODIUM PHOSPHATE 10 MG/ML IJ SOLN
INTRAMUSCULAR | Status: AC
Start: 2016-10-04 — End: 2016-10-04
  Filled 2016-10-04: qty 1

## 2016-10-04 MED ORDER — IBUPROFEN 600 MG PO TABS
600.0000 mg | ORAL_TABLET | Freq: Once | ORAL | Status: AC
  Administered 2016-10-04: 600 mg via ORAL

## 2016-10-04 SURGICAL SUPPLY — 20 items
BLADE SURG 15 STRL LF C SS BP (BLADE) ×1 IMPLANT
BLADE SURG 15 STRL SS (BLADE) ×2
CLOTH BEACON ORANGE TIMEOUT ST (SAFETY) ×2 IMPLANT
COUNTER NEEDLE 1200 MAGNETIC (NEEDLE) ×1 IMPLANT
ELECT REM PT RETURN 9FT ADLT (ELECTROSURGICAL) ×2
ELECTRODE REM PT RTRN 9FT ADLT (ELECTROSURGICAL) ×1 IMPLANT
GLOVE BIO SURGEON STRL SZ 6.5 (GLOVE) ×2 IMPLANT
GLOVE BIOGEL PI IND STRL 7.0 (GLOVE) ×1 IMPLANT
GLOVE BIOGEL PI INDICATOR 7.0 (GLOVE) ×1
GOWN STRL REUS W/TWL LRG LVL3 (GOWN DISPOSABLE) ×5 IMPLANT
NEEDLE HYPO 22GX1.5 SAFETY (NEEDLE) ×1 IMPLANT
PACK VAGINAL MINOR WOMEN LF (CUSTOM PROCEDURE TRAY) ×2 IMPLANT
PAD OB MATERNITY 4.3X12.25 (PERSONAL CARE ITEMS) ×2 IMPLANT
PENCIL BUTTON HOLSTER BLD 10FT (ELECTRODE) ×1 IMPLANT
SUT MON AB 3-0 SH 27 (SUTURE) ×4
SUT MON AB 3-0 SH27 (SUTURE) IMPLANT
SUT VIC AB 3-0 SH 27 (SUTURE) ×2
SUT VIC AB 3-0 SH 27X BRD (SUTURE) IMPLANT
TOWEL OR 17X24 6PK STRL BLUE (TOWEL DISPOSABLE) ×4 IMPLANT
WATER STERILE IRR 1000ML POUR (IV SOLUTION) ×2 IMPLANT

## 2016-10-04 NOTE — Anesthesia Postprocedure Evaluation (Signed)
Anesthesia Post Note  Patient: Kayla Archer  Procedure(s) Performed: Procedure(s) (LRB): LABIAL REDUCTION (Bilateral)  Patient location during evaluation: PACU Anesthesia Type: MAC Level of consciousness: awake and alert Pain management: pain level controlled Vital Signs Assessment: post-procedure vital signs reviewed and stable Respiratory status: spontaneous breathing, nonlabored ventilation, respiratory function stable and patient connected to nasal cannula oxygen Cardiovascular status: stable and blood pressure returned to baseline Anesthetic complications: no     Last Vitals:  Vitals:   10/04/16 1430 10/04/16 1445  BP: 124/69 (!) 124/55  Pulse: 60 (!) 51  Resp: 14 13  Temp:      Last Pain:  Vitals:   10/04/16 1445  TempSrc:   PainSc: 2    Pain Goal: Patients Stated Pain Goal: 5 (10/04/16 1430)               Amaurie Schreckengost EDWARD

## 2016-10-04 NOTE — Brief Op Note (Signed)
10/04/2016  1:50 PM  PATIENT:  Kayla Archer  50 y.o. female  PRE-OPERATIVE DIAGNOSIS:  labial hypertrophy  POST-OPERATIVE DIAGNOSIS:  labial hypertrophy  PROCEDURE:  Procedure(s): LABIAL REDUCTION (Bilateral)  SURGEON:  Surgeon(s) and Role:    * Marcelle OverlieMichelle Madgeline Rayo, MD - Primary  PHYSICIAN ASSISTANT:   ASSISTANTS: none   ANESTHESIA:   local and MAC  EBL:  Total I/O In: 1000 [I.V.:1000] Out: 25 [Blood:25]  BLOOD ADMINISTERED:none  DRAINS: none   LOCAL MEDICATIONS USED:  LIDOCAINE   SPECIMEN:  No Specimen  DISPOSITION OF SPECIMEN:  N/A  COUNTS:  YES  TOURNIQUET:  * No tourniquets in log *  DICTATION: .Other Dictation: Dictation Number [id number]  PLAN OF CARE: Discharge to home after PACU  PATIENT DISPOSITION:  PACU - hemodynamically stable.   Delay start of Pharmacological VTE agent (>24hrs) due to surgical blood loss or risk of bleeding: not applicable

## 2016-10-04 NOTE — Anesthesia Preprocedure Evaluation (Addendum)
Anesthesia Evaluation  Patient identified by MRN, date of birth, ID band Patient awake    Reviewed: Allergy & Precautions, H&P , Patient's Chart, lab work & pertinent test results, reviewed documented beta blocker date and time   Airway Mallampati: II  TM Distance: >3 FB Neck ROM: full    Dental no notable dental hx.    Pulmonary    Pulmonary exam normal breath sounds clear to auscultation       Cardiovascular  Rhythm:regular Rate:Normal     Neuro/Psych    GI/Hepatic   Endo/Other    Renal/GU      Musculoskeletal   Abdominal   Peds  Hematology   Anesthesia Other Findings   Reproductive/Obstetrics                             Anesthesia Physical Anesthesia Plan  ASA: II  Anesthesia Plan: MAC   Post-op Pain Management:    Induction: Intravenous  Airway Management Planned: Mask and Natural Airway  Additional Equipment:   Intra-op Plan:   Post-operative Plan:   Informed Consent: I have reviewed the patients History and Physical, chart, labs and discussed the procedure including the risks, benefits and alternatives for the proposed anesthesia with the patient or authorized representative who has indicated his/her understanding and acceptance.   Dental Advisory Given and Dental advisory given  Plan Discussed with: CRNA and Surgeon  Anesthesia Plan Comments: (DiscussedMAC vs  GA with LMA, possible sore throat, potential need to switch to ETT, N/V, pulmonary aspiration. Questions answered.  Pt prefers MAC)       Anesthesia Quick Evaluation

## 2016-10-04 NOTE — Transfer of Care (Signed)
Immediate Anesthesia Transfer of Care Note  Patient: Kayla Archer  Procedure(s) Performed: Procedure(s): LABIAL REDUCTION (Bilateral)  Patient Location: PACU  Anesthesia Type:MAC  Level of Consciousness: awake  Airway & Oxygen Therapy: Patient Spontanous Breathing  Post-op Assessment: Report given to RN and Post -op Vital signs reviewed and stable  Post vital signs: stable  Last Vitals:  Vitals:   10/04/16 1201  BP: 122/73  Pulse: 72  Resp: 16  Temp: 36.7 C    Last Pain:  Vitals:   10/04/16 1201  TempSrc: Oral      Patients Stated Pain Goal: 5 (10/04/16 1201)  Complications: No apparent anesthesia complications

## 2016-10-04 NOTE — Progress Notes (Signed)
H and P on the chart No significant changes Will proceed with bilateral labial reduction Consent signed

## 2016-10-04 NOTE — H&P (Signed)
50 year old female with enlarged bilateral labia.  They are uncomfortable. She is scheduled for bilateral labial reduction.  Past Medical History:  Diagnosis Date  . Cartilage tear    rt knee  . Heart murmur   . Medical history non-contributory    see valve rep-  . PONV (postoperative nausea and vomiting)    plus hypotension   Past Surgical History:  Procedure Laterality Date  . 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 KNEE ARTHROSCOPY Right 12/28/2013   Procedure: ARTHROSCOPY KNEE;  Surgeon: Dannielle HuhSteve Lucey, MD;  [facility];  Service: Orthopedics;  Laterality: Right;   Allergies: NKDA  There were no vitals taken for this visit.  Prior to Admission medications   Medication Sig Start Date End Date Taking? Authorizing Provider  acetaminophen (TYLENOL) 500 MG tablet Take 500 mg by mouth every 6 (six) hours as needed for moderate pain.    Historical Provider, MD  amoxicillin (AMOXIL) 500 MG capsule Take 2,000 mg by mouth as needed (Prior to dental procedures).     Historical Provider, MD  ibuprofen (ADVIL,MOTRIN) 200 MG tablet Take 400 mg by mouth every 6 (six) hours as needed for moderate pain.    Historical Provider, MD   General alert and oriented Lung CTAB Car RRR Abdomen is soft and non tender  Pelvic bilaterally enlarged labia  IMPRESSION: Bilateral enlarged labia  PLAN: Bilateral labial reduction Risks reviewed Consent signed

## 2016-10-05 ENCOUNTER — Encounter (HOSPITAL_COMMUNITY): Payer: Self-pay | Admitting: Obstetrics and Gynecology

## 2016-10-05 NOTE — Op Note (Signed)
NAMMerlyn Albert:  Kayla Archer, Kayla Archer              ACCOUNT NO.:  [id number]  MEDICAL RECORD NO.:  [id number]  LOCATION:  [facility]                          FACILITY:  [facility]  PHYSICIAN:  Ismeal Heider L. Josmar Messimer, M.D.DATE OF BIRTH:  1966-02-14  DATE OF PROCEDURE:  10/04/2016 DATE OF DISCHARGE:  10/04/2016                              OPERATIVE REPORT   PREOPERATIVE DIAGNOSIS:  Bilateral labial hypertrophy.  POSTOPERATIVE DIAGNOSIS:  Bilateral labial hypertrophy.  PROCEDURE:  Bilateral labial reduction.  SURGEON:  Gisel Vipond L. Vincente PoliGrewal, M.D.  ANESTHESIA:  Local with MAC.  ESTIMATED BLOOD LOSS:  Minimal.  COMPLICATIONS:  None.  DESCRIPTION OF PROCEDURE:  The patient was taken to the operating room. She was administered anesthesia.  She was prepped and draped in usual sterile fashion.  Exam under anesthesia revealed she had bilaterally enlarged labia that had a dog ear shape.  Local was infiltrated along the labia.  A marking pen was used to mark the area of skin that was to be removed.  I then used a scalpel and carefully removed the excessive labial skin on the right side.  Cautery was used for hemostasis and then the skin was reapproximated using 3-0 Monocryl.  This was done in the left side in an identical fashion.  At the end of the procedure, she had bilateral symmetrical labia that were reduced nicely.  Hemostasis was very good.  Neosporin was applied to both areas.  An ice pack was placed.  The patient was then taken to the recovery room in stable condition.  All sponge, lap, and instrument counts were correct x2.     Saeed Toren L. Vincente PoliGrewal, M.D.     Florestine AversMLG/MEDQ  D:  10/04/2016  T:  10/05/2016  Job:  [id number]

## 2016-11-12 NOTE — Progress Notes (Signed)
Tawana ScaleZach Nena Hampe D.O. [facility] [address] [place], [place] [address] Phone: [phone] Subjective:    CC: low back pain   BJY:NWGNFAOZHYHPI:Subjective  Kayla Archer is a 50 y.o. female coming in with complaint of low back pain. Patient states it seems to be more left-sided. Been going on greater than 3 weeks. Patient states some mild radiation down the posterior aspect the leg but more intermittent. Worse with lifting a straight leg. Better when at rest. Patient has avoided any type of activity at this time secondary to the pain. Patient states that sometimes it seems to lock up and she is unable to move. Does respond ibuprofen but very mild. Never completely resolves.     Past Medical History:  Diagnosis Date  . Cartilage tear    rt knee  . Heart murmur   . Medical history non-contributory    see valve rep-  . PONV (postoperative nausea and vomiting)    plus hypotension   Past Surgical History:  Procedure Laterality Date  . 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 KNEE ARTHROSCOPY Right 12/28/2013   Procedure: ARTHROSCOPY KNEE;  Surgeon: Dannielle HuhSteve Lucey, MD;  [facility];  Service: Orthopedics;  Laterality: Right;  . LABIOPLASTY Bilateral 10/04/2016   Procedure: LABIAL REDUCTION;  Surgeon: Marcelle OverlieMichelle Grewal, MD;  Location: [facility]S;  Service: Gynecology;  Laterality: Bilateral;   Social History   Social History  . Marital status: Married    Spouse name: N/A  . Number of children: N/A  . Years of education: N/A   Social History Main Topics  . Smoking status: Never Smoker  . Smokeless tobacco: Never Used  . Alcohol use 1.8 oz/week    3 Glasses of wine per week  . Drug use: No  . Sexual activity: Not Asked   Other Topics Concern  . None   Social History Narrative  . None   No Known Allergies Family History  Problem Relation Age of Onset  . Hypertension Mother   . Diabetes type II Mother   . Other Mother     NONHODGKINS LYMPHOMA    . Stroke Father   . Heart attack Father   . Other Sister     PRIMARY PULMONARY HTN  . Healthy Brother   . Hypertension Sister   . Diabetes type II Sister   . Hypertension Sister   . Diabetes type II Sister     Past medical history, social, surgical and family history all reviewed in electronic medical record.  No pertanent information unless stated regarding to the chief complaint.   Review of Systems:Review of systems updated and as accurate as of 11/13/16  No headache, visual changes, nausea, vomiting, diarrhea, constipation, dizziness, abdominal pain, skin rash, fevers, chills, night sweats, weight loss, swollen lymph nodes, body aches, joint swelling, muscle aches, chest pain, shortness of breath, mood changes.   Objective  Blood pressure 114/74, pulse 72, height 5\' 4"  (1.626 m), weight 140 lb (63.5 kg), SpO2 98 %. Systems examined below as of 11/13/16   General: No apparent distress alert and oriented x3 mood and affect normal, dressed appropriately.  HEENT: Pupils equal, extraocular movements intact  Respiratory: Patient's speak in full sentences and does not appear short of breath  Cardiovascular: No lower extremity edema, non tender, no erythema  Skin: Warm dry intact with no signs of infection or rash on extremities or on axial skeleton.  Abdomen: Soft nontender  Neuro: Cranial nerves  II through XII are intact, neurovascularly intact in all extremities with 2+ DTRs and 2+ pulses.  Lymph: No lymphadenopathy of posterior or anterior cervical chain or axillae bilaterally.  Gait normal with good balance and coordination.  MSK:  Non tender with full range of motion and good stability and symmetric strength and tone of shoulders, elbows, wrist, hip, knee and ankles bilaterally.  Back Exam:  Inspection: Unremarkable  Motion: Flexion 25 deg with worsening symptoms, Extension 15 deg, Side Bending to 35 deg bilaterally,  Rotation to 35 deg bilaterally  SLR laying: Positive left  side XSLR laying: Negative  Palpable tenderness: Tender to palpation in the paraspinal musculature mostly of the left L5-S1 region.Marland Kitchen. FABER: negative. Tightness noted Sensory change: Gross sensation intact to all lumbar and sacral dermatomes.  Reflexes: 2+ at both patellar tendons, 2+ at achilles tendons, Babinski's downgoing.  Strength at foot  Plantar-flexion: 5/5 Dorsi-flexion: 5/5 Eversion: 5/5 Inversion: 5/5  Leg strength  Quad: 5/5 Hamstring: 5/5 Hip flexor: 5/5 Hip abductors: 5/5  Gait unremarkable.  Procedure note 97110; 15 minutes spent for Therapeutic exercises as stated in above notes.  This included exercises focusing on stretching, strengthening, with significant focus on eccentric aspects. Low back exercises that included:  Pelvic tilt/bracing instruction to focus on control of the pelvic girdle and lower abdominal muscles  Glute strengthening exercises, focusing on proper firing of the glutes without engaging the low back muscles Proper stretching techniques for maximum relief for the hamstrings, hip flexors, low back and some rotation where tolerated    Proper technique shown and discussed handout in great detail with ATC.  All questions were discussed and answered.     Impression and Recommendations:     This case required medical decision making of moderate complexity.      Note: This dictation was prepared with Dragon dictation along with smaller phrase technology. Any transcriptional errors that result from this process are unintentional.

## 2016-11-13 ENCOUNTER — Ambulatory Visit (INDEPENDENT_AMBULATORY_CARE_PROVIDER_SITE_OTHER): Payer: Managed Care, Other (non HMO) | Admitting: Family Medicine

## 2016-11-13 ENCOUNTER — Ambulatory Visit (INDEPENDENT_AMBULATORY_CARE_PROVIDER_SITE_OTHER)
Admission: RE | Admit: 2016-11-13 | Discharge: 2016-11-13 | Disposition: A | Discharge status: Home / Self Care | Payer: Managed Care, Other (non HMO) | Source: Ambulatory Visit | Attending: Family Medicine | Admitting: Family Medicine

## 2016-11-13 ENCOUNTER — Encounter: Payer: Self-pay | Admitting: Family Medicine

## 2016-11-13 VITALS — BP 114/74 | HR 72 | Ht 64.0 in | Wt 140.0 lb

## 2016-11-13 DIAGNOSIS — M5416 Radiculopathy, lumbar region: Secondary | ICD-10-CM | POA: Diagnosis not present

## 2016-11-13 DIAGNOSIS — M5126 Other intervertebral disc displacement, lumbar region: Secondary | ICD-10-CM

## 2016-11-13 MED ORDER — GABAPENTIN 100 MG PO CAPS: 200.0000 mg | ORAL_CAPSULE | Freq: Every day | ORAL | 3 refills | Status: DC

## 2016-11-13 MED ORDER — PREDNISONE 50 MG PO TABS: 50.0000 mg | ORAL_TABLET | Freq: Every day | ORAL | 0 refills | Status: DC

## 2016-11-13 NOTE — Assessment & Plan Note (Signed)
Patient is having some mild radicular symptoms. No significant weakness. Patient will be started on prednisone. We'll watch for fluid overload with patient's cardiac history. Patient will come back on one week. Also started on gabapentin. Given some range of motion exercises and work with Event organiserathletic trainer today. Patient come back in 1 week. X-rays pending. At that time if any worsening symptoms or if patient has worsening symptoms such as numbness or weakness that is constant we will seek advance imaging. Patient understands and has agreement with plan.

## 2016-11-13 NOTE — Patient Instructions (Signed)
Good to see you.  Ice 20 minutes 2 times daily. Usually after activity and before bed. Prednisone daily for 5 days-  I apologize to your family ;) Gabapentin 200 mg nightly Try our exercises most days of the week.  Xray downstairs.  OK to cycle if you need to work out.  See me again in 1 week (OK to double book)

## 2016-11-19 NOTE — Progress Notes (Signed)
Tawana ScaleZach Omolola Mittman D.O. [facility] [address] [place], [place] [address] Phone: [phone]) [phone] Subjective:    CC: low back pain f/u  MWU:[id number]:Subjective  Kayla Archer is a 50 y.o. female coming in with complaint of low back pain. Patient states it seems to be more left-sided. Patient was found to have more of a lumbar radiculopathy. Was seen last week. Was given prednisone as well as gabapentin. States that she is sleeping better and states that the pain is significantly less. Very mild radiation down the leg but no weakness. Feels like she is making improvement.     Past Medical History:  Diagnosis Date  . Cartilage tear    rt knee  . Heart murmur   . Medical history non-contributory    see valve rep-  . PONV (postoperative nausea and vomiting)    plus hypotension   Past Surgical History:  Procedure Laterality Date  . 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 KNEE ARTHROSCOPY Right 12/28/2013   Procedure: ARTHROSCOPY KNEE;  Surgeon: Dannielle HuhSteve Lucey, MD;  [facility];  Service: Orthopedics;  Laterality: Right;  . LABIOPLASTY Bilateral 10/04/2016   Procedure: LABIAL REDUCTION;  Surgeon: Marcelle OverlieMichelle Grewal, MD;  Location: [facility];  Service: Gynecology;  Laterality: Bilateral;   Social History   Social History  . Marital status: Married    Spouse name: N/A  . Number of children: N/A  . Years of education: N/A   Social History Main Topics  . Smoking status: Never Smoker  . Smokeless tobacco: Never Used  . Alcohol use 1.8 oz/week    3 Glasses of wine per week  . Drug use: No  . Sexual activity: Not Asked   Other Topics Concern  . None   Social History Narrative  . None   No Known Allergies Family History  Problem Relation Age of Onset  . Hypertension Mother   . Diabetes type II Mother   . Other Mother     NONHODGKINS LYMPHOMA  . Stroke Father   . Heart attack Father   . Other Sister     PRIMARY PULMONARY HTN  . Healthy Brother     . Hypertension Sister   . Diabetes type II Sister   . Hypertension Sister   . Diabetes type II Sister     Past medical history, social, surgical and family history all reviewed in electronic medical record.  No pertanent information unless stated regarding to the chief complaint.   Review of Systems: No headache, visual changes, nausea, vomiting, diarrhea, constipation, dizziness, abdominal pain, skin rash, fevers, chills, night sweats, weight loss, swollen lymph nodes, body aches, joint swelling, muscle aches, chest pain, shortness of breath, mood changes.    Objective  Blood pressure 120/70, pulse 63, height 5\' 4"  (1.626 m), weight 141 lb (64 kg), SpO2 98 %.   Systems examined below as of 11/20/16 General: NAD A&O x3 mood, affect normal  HEENT: Pupils equal, extraocular movements intact no nystagmus Respiratory: not short of breath at rest or with speaking Cardiovascular: No lower extremity edema, non tender Skin: Warm dry intact with no signs of infection or rash on extremities or on axial skeleton. Abdomen: Soft nontender, no masses Neuro: Cranial nerves  intact, neurovascularly intact in all extremities with 2+ DTRs and 2+ pulses. Lymph: No lymphadenopathy appreciated today  Gait normal with good balance and coordination.  MSK: Non tender with full range of motion and good stability and symmetric strength  and tone of shoulders, elbows, wrist,  knee hips and ankles bilaterally.   Back Exam:  Inspection: Unremarkable  Motion: Flexion 35 deg with Still mild worsening symptoms of the radicular Extension 15 deg, Side Bending to 35 deg bilaterally,  Rotation to 35 deg bilaterally  SLR laying: Positive left side 35 is a 25 XSLR laying: Negative  Palpable tenderness: Mild tenderness over the left sacroiliac joint FABER: negative. Tightness noted on left side owing but mild improvement Sensory change: Gross sensation intact to all lumbar and sacral dermatomes.  Reflexes: 2+ at both  patellar tendons, 2+ at achilles tendons, Babinski's downgoing.  Strength at foot  Plantar-flexion: 5/5 Dorsi-flexion: 5/5 Eversion: 5/5 Inversion: 5/5  Leg strength  Quad: 5/5 Hamstring: 5/5 Hip flexor: 5/5 Hip abductors: 5/5  Gait unremarkable.  Osteopathic findings C2 flexed rotated and side bent right T4 extended rotated and side bent right L3 flexed rotated and side bent left Sacrum right on right   Impression and Recommendations:     This case required medical decision making of moderate complexity.      Note: This dictation was prepared with Dragon dictation along with smaller phrase technology. Any transcriptional errors that result from this process are unintentional.

## 2016-11-20 ENCOUNTER — Encounter: Payer: Self-pay | Admitting: Family Medicine

## 2016-11-20 ENCOUNTER — Ambulatory Visit (INDEPENDENT_AMBULATORY_CARE_PROVIDER_SITE_OTHER): Payer: Managed Care, Other (non HMO) | Admitting: Family Medicine

## 2016-11-20 DIAGNOSIS — M999 Biomechanical lesion, unspecified: Secondary | ICD-10-CM | POA: Insufficient documentation

## 2016-11-20 DIAGNOSIS — M5416 Radiculopathy, lumbar region: Secondary | ICD-10-CM

## 2016-11-20 NOTE — Patient Instructions (Signed)
Good to see you.  Ice 20 minutes 2 times daily. Usually after activity and before bed. Exercises 3 times a week.  Elliptical is good now See me again in 2-3 weeks! Happy holidays!

## 2016-11-20 NOTE — Assessment & Plan Note (Signed)
Significant improvement at this time. We will continue to avoid oral anti-inflammatories secondary to patient's valve replacement. Patient will continue to be active otherwise. Given trial of topical anti-inflammatories. Responded well to manipulation. Follow-up again in 3 weeks

## 2016-11-20 NOTE — Assessment & Plan Note (Signed)
Decision today to treat with OMT was based on Physical Exam  After verbal consent patient was treated with ME, HVLA, FPR techniques in cervical, thoracic, lumbar and sacral areas  Patient tolerated the procedure well with improvement in symptoms  Patient given exercises, stretches and lifestyle modifications  See medications in patient instructions if given  Patient will follow up in 3 weeks

## 2016-12-04 ENCOUNTER — Telehealth: Payer: Self-pay | Admitting: Family Medicine

## 2016-12-04 DIAGNOSIS — M5416 Radiculopathy, lumbar region: Secondary | ICD-10-CM

## 2016-12-04 NOTE — Telephone Encounter (Signed)
Lets order MRI but likely not going to happen before end of year.  MRI lumbar for radicular pain

## 2016-12-04 NOTE — Telephone Encounter (Signed)
Patient states she has reinjured her back again.  She is requesting MRI.  Patient would like to get MRI before end of month.  Did notify patient that it could be first of year.

## 2016-12-04 NOTE — Telephone Encounter (Signed)
Spoke to pt, ordered MRI. Pt made aware she may not be able to have it done before the end of the year.

## 2016-12-05 ENCOUNTER — Other Ambulatory Visit: Payer: Self-pay | Admitting: Family Medicine

## 2016-12-05 NOTE — Progress Notes (Signed)
Tawana ScaleZach Izzy Doubek D.O. [facility] [address] [place], [place] [address] Phone: [phone](336) [phone] Subjective:    CC: low back pain f/u  ION:[id number]:Subjective  Kayla Archer is a 50 y.o. female coming in with complaint of low back pain. Patient states it seems to be more left-sided. Patient was found to have more of a lumbar radiculopathy. Patient was doing somewhat better for a while. We attempted osteopathic manipulation. Unfortunately patient states that she start having worsening pain again. Patient states that she cannot even standing for long amount of 1 hour without having significant severe pain in the lower back with radiation going down the left leg. Sometimes feels like her left leg is going to give out on her. Patient states it is also waking her up at night which is new. This is seems to be worsening overall. Affecting daily activities.   patient did have x-rays of the lumbar spine 11/13/2016. This is independent living visualized by me. X-rays show no significant bony normality.  Past Medical History:  Diagnosis Date  . Cartilage tear    rt knee  . Heart murmur   . Medical history non-contributory    see valve rep-  . PONV (postoperative nausea and vomiting)    plus hypotension   Past Surgical History:  Procedure Laterality Date  . 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 KNEE ARTHROSCOPY Right 12/28/2013   Procedure: ARTHROSCOPY KNEE;  Surgeon: Dannielle HuhSteve Lucey, MD;  [facility];  Service: Orthopedics;  Laterality: Right;  . LABIOPLASTY Bilateral 10/04/2016   Procedure: LABIAL REDUCTION;  Surgeon: Marcelle OverlieMichelle Grewal, MD;  Location: [facility];  Service: Gynecology;  Laterality: Bilateral;   Social History   Social History  . Marital status: Married    Spouse name: N/A  . Number of children: N/A  . Years of education: N/A   Social History Main Topics  . Smoking status: Never Smoker  . Smokeless tobacco: Never Used  . Alcohol use 1.8 oz/week      3 Glasses of wine per week  . Drug use: No  . Sexual activity: Not Asked   Other Topics Concern  . None   Social History Narrative  . None   No Known Allergies Family History  Problem Relation Age of Onset  . Hypertension Mother   . Diabetes type II Mother   . Other Mother     NONHODGKINS LYMPHOMA  . Stroke Father   . Heart attack Father   . Other Sister     PRIMARY PULMONARY HTN  . Healthy Brother   . Hypertension Sister   . Diabetes type II Sister   . Hypertension Sister   . Diabetes type II Sister     Past medical history, social, surgical and family history all reviewed in electronic medical record.  No pertanent information unless stated regarding to the chief complaint.   Review of Systems: No headache, visual changes, nausea, vomiting, diarrhea, constipation, dizziness, abdominal pain, skin rash, fevers, chills, night sweats, weight loss, swollen lymph nodes, chest pain, shortness of breath, mood changes.    Objective  Blood pressure 122/74, pulse 72, height 5\' 4"  (1.626 m), SpO2 96 %.   Systems examined below as of 12/06/16 General: NAD A&O x3 mood, affect normal  HEENT: Pupils equal, extraocular movements intact no nystagmus Respiratory: not short of breath at rest or with speaking Cardiovascular: No lower extremity edema, non tender Skin: Warm dry intact with no signs of infection  or rash on extremities or on axial skeleton. Abdomen: Soft nontender, no masses Neuro: Cranial nerves  intact, neurovascularly intact in all extremities with 2+ DTRs and 2+ pulses. Lymph: No lymphadenopathy appreciated today  Gait normal with good balance and coordination.  MSK: Non tender with full range of motion and good stability and symmetric strength and tone of shoulders, elbows, wrist,  knee hips bilaterally.   Back Exam:  Inspection: Unremarkable  Motion: Flexion 25 deg with Still mild worsening symptoms of the radicular symptoms down the left leg. Extension 15 deg,  Side Bending to 25 deg bilaterally,  Rotation to 25 deg bilaterally  SLR laying: Positive straight leg test at 15 on the left side worsening from previous exam XSLR laying: Positive as well with radicular symptoms down the left leg Palpable tenderness: Tender over the L5-S1 area on the left side of the paraspinal musculature FABER: negative. Increasing discomfort and positive on the left side Sensory change: Gross sensation intact to all lumbar and sacral dermatomes.  Reflexes: 2+ at both patellar tendons, 2+ at achilles tendons, Babinski's downgoing.  Strength at foot  Strength shows the patient does have 4 out of 5 strength of dorsiflexion on the left side compared to the contralateral side    Impression and Recommendations:     This case required medical decision making of moderate complexity.      Note: This dictation was prepared with Dragon dictation along with smaller phrase technology. Any transcriptional errors that result from this process are unintentional.

## 2016-12-06 ENCOUNTER — Ambulatory Visit (INDEPENDENT_AMBULATORY_CARE_PROVIDER_SITE_OTHER): Payer: Managed Care, Other (non HMO) | Admitting: Family Medicine

## 2016-12-06 ENCOUNTER — Encounter: Payer: Self-pay | Admitting: Family Medicine

## 2016-12-06 DIAGNOSIS — M5416 Radiculopathy, lumbar region: Secondary | ICD-10-CM | POA: Diagnosis not present

## 2016-12-06 NOTE — Assessment & Plan Note (Signed)
Patient is having worsening symptoms at this time. Patient also found to have weakness. Patient has 4 out of 5 on dorsiflexion. Positive straight leg test worse than ever. Patient was given sentences symptoms of even weakness and affecting daily activities and waking her up at night. I do feel at this time that advance imaging is warranted. MRI of the lumbar spine ordered a surgeon. We will see what this is finding is an patient is a candidate for epidural steroid injections as well as potential surgical intervention if necessary. Patient knows if any bowel or bladder incontinence occurs to seek medical attention immediately. Patient's encouraged to take the gabapentin on a more regular basis. We did discuss the possibility of prednisone but at this point patient would rather have the imaging if possible without the medication on board. Depending on findings we'll discuss further treatment options.

## 2016-12-06 NOTE — Patient Instructions (Signed)
Great to see you!   

## 2016-12-07 ENCOUNTER — Other Ambulatory Visit: Payer: Self-pay | Admitting: Family Medicine

## 2016-12-08 ENCOUNTER — Other Ambulatory Visit: Payer: Managed Care, Other (non HMO)

## 2016-12-19 NOTE — Progress Notes (Signed)
Kayla Archer D.O. [facility] [address] [place], [place] [address] Phone: [phone] Subjective:    CC: low back pain f/u   YSA:[id number]:Subjective  Kayla A Terrilee CroakKnight is a 51 y.o. female coming in with complaint of low back pain. Patient states it seems to be more left-sided. Patient was found to have more of a lumbar radiculopathy. Patient was doing somewhat better for a while. We attempted osteopathic manipulation. .Patient was having worsening symptoms with a positive straight leg test. Patient was referred for an MRI. This is not been accomplished.atient continues to have the pain. patient states that it continues t have times wheeft lower back Patient states that most of the time she seems to do relatively well but still has not regained a pain-free lifestyle. States that she is sleeping comfortably well. Would states little no improvement.    patient did have x-rays of the lumbar spine 11/13/2016. This is independent living visualized by me. X-rays show no significant bony normality.  Past Medical History:  Diagnosis Date  . Cartilage tear    rt knee  . Heart murmur   . Medical history non-contributory    see valve rep-  . PONV (postoperative nausea and vomiting)    plus hypotension   Past Surgical History:  Procedure Laterality Date  . 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 KNEE ARTHROSCOPY Right 12/28/2013   Procedure: ARTHROSCOPY KNEE;  Surgeon: Dannielle HuhSteve Lucey, MD;  [facility];  Service: Orthopedics;  Laterality: Right;  . LABIOPLASTY Bilateral 10/04/2016   Procedure: LABIAL REDUCTION;  Surgeon: Marcelle OverlieMichelle Grewal, MD;  Location: [facility];  Service: Gynecology;  Laterality: Bilateral;   Social History   Social History  . Marital status: Married    Spouse name: N/A  . Number of children: N/A  . Years of education: N/A   Social History Main Topics  . Smoking status: Never Smoker  . Smokeless tobacco: Never Used  . Alcohol use 1.8  oz/week    3 Glasses of wine per week  . Drug use: No  . Sexual activity: Not Asked   Other Topics Concern  . None   Social History Narrative  . None   No Known Allergies Family History  Problem Relation Age of Onset  . Hypertension Mother   . Diabetes type II Mother   . Other Mother     NONHODGKINS LYMPHOMA  . Stroke Father   . Heart attack Father   . Other Sister     PRIMARY PULMONARY HTN  . Healthy Brother   . Hypertension Sister   . Diabetes type II Sister   . Hypertension Sister   . Diabetes type II Sister     Past medical history, social, surgical and family history all reviewed in electronic medical record.  No pertanent information unless stated regarding to the chief complaint.   Review of Systems: No headache, visual changes, nausea, vomiting, diarrhea, constipation, dizziness, abdominal pain, skin rash, fevers, chills, night sweats, weight loss, swollen lymph nodes, body aches, joint swelling, muscle aches, chest pain, shortness of breath, mood changes.     Objective  Blood pressure 138/80, pulse 74, height 5\' 4"  (1.626 m).   Systems examined below as of 12/20/16 General: NAD A&O x3 mood, affect normal  HEENT: Pupils equal, extraocular movements intact no nystagmus Respiratory: not short of breath at rest or with speaking Cardiovascular: No lower extremity edema, non tender Skin: Warm dry intact with no signs of  infection or rash on extremities or on axial skeleton. Abdomen: Soft nontender, no masses Neuro: Cranial nerves  intact, neurovascularly intact in all extremities with 2+ DTRs and 2+ pulses. Lymph: No lymphadenopathy appreciated today  Gait normal with good balance and coordination.  MSK: Non tender with full range of motion and good stability and symmetric strength and tone of shoulders, elbows, wrist,  knee hips and ankles bilaterally.   Back Exam:  Inspection: Unremarkable  Motion: Flexion 25 deg with Still mild worsening symptoms of the  radicular symptoms down the left leg no change from previous exam. Extension 15 deg, Side Bending to 25 deg bilaterally,  Rotation to 25 deg bilaterally  SLR laying: continued positive straight leg test on the left  XSLR laying: still has some mild positive crossover test as well.  Palpable tenderness: Tender over the L5-S1 area on the left side of the paraspinal musculature FABER: negative. Increasing discomfort and positive on the left side Sensory change: Gross sensation intact to all lumbar and sacral dermatomes.  Reflexes: 2+ at both patellar tendons, 2+ at achilles tendons, Babinski's downgoing.  Strength at foot  Strength is 4+ on the left sign compared to 5 out of 5 on the right side   Osteopathic findings T3 extended rotated and side bent right inhaled third rib T9 extended rotated and side bent left L2 flexed rotated and side bent right Sacrum right on right     Impression and Recommendations:     This case required medical decision making of moderate complexity.      Note: This dictation was prepared with Dragon dictation along with smaller phrase technology. Any transcriptional errors that result from this process are unintentional.

## 2016-12-20 ENCOUNTER — Ambulatory Visit (INDEPENDENT_AMBULATORY_CARE_PROVIDER_SITE_OTHER): Payer: BLUE CROSS/BLUE SHIELD | Admitting: Family Medicine

## 2016-12-20 ENCOUNTER — Encounter: Payer: Self-pay | Admitting: Family Medicine

## 2016-12-20 VITALS — BP 138/80 | HR 74 | Ht 64.0 in

## 2016-12-20 DIAGNOSIS — M999 Biomechanical lesion, unspecified: Secondary | ICD-10-CM | POA: Diagnosis not present

## 2016-12-20 DIAGNOSIS — M5416 Radiculopathy, lumbar region: Secondary | ICD-10-CM | POA: Diagnosis not present

## 2016-12-20 NOTE — Assessment & Plan Note (Signed)
Discussed with patient again at great length. Patient wanted to try conservative therapy including osteopathic manipulation again today. In addition of this patient was referred to formal physical therapy that I think could be beneficial. We discussed icing regimen. Discussed home exercises. We discussed which activities to do a which was potentially avoid. Patient will be leaving on a skiing trip and we did discuss the possibility of another round of prednisone which patient declined. We discussed other medications which patient also declined. Patient come back and see me again in 4 weeks.

## 2016-12-20 NOTE — Patient Instructions (Signed)
Good to see you  We will get physical therapy  Ice is your friend. Ice 20 minutes 2 times daily. Usually after activity and before bed. Look into nortriptyline and Effexor if needed.  See me again in 4 weeks.

## 2016-12-20 NOTE — Assessment & Plan Note (Signed)
Decision today to treat with OMT was based on Physical Exam  After verbal consent patient was treated with ME, HVLA, FPR techniques in cervical, thoracic, lumbar and sacral areas  Patient tolerated the procedure well with improvement in symptoms  Patient given exercises, stretches and lifestyle modifications  See medications in patient instructions if given  Patient will follow up in 4 weeks

## 2016-12-24 ENCOUNTER — Telehealth: Payer: Self-pay | Admitting: Family Medicine

## 2016-12-24 NOTE — Telephone Encounter (Signed)
PT order entered

## 2016-12-24 NOTE — Telephone Encounter (Signed)
Patient states her back started back up hurting last night.  She would like to go ahead with referral for PT.  Also, please follow up with patient in case she needs to come in for OV or if she needs to be instructed to do something else for pain.

## 2016-12-28 ENCOUNTER — Encounter: Payer: Self-pay | Admitting: Physical Therapy

## 2016-12-28 ENCOUNTER — Ambulatory Visit: Payer: BLUE CROSS/BLUE SHIELD | Attending: Family Medicine | Admitting: Physical Therapy

## 2016-12-28 DIAGNOSIS — G8929 Other chronic pain: Secondary | ICD-10-CM | POA: Insufficient documentation

## 2016-12-28 DIAGNOSIS — M545 Low back pain: Secondary | ICD-10-CM | POA: Diagnosis not present

## 2016-12-28 DIAGNOSIS — M6281 Muscle weakness (generalized): Secondary | ICD-10-CM

## 2016-12-28 NOTE — Patient Instructions (Signed)
Diaphragmatic Transverse abdominal contraction- Supine    Lying on your back draw naval up and in and brace. Do throughout the day in sitting and standing as well Repeat _10__ times. Hold _5_ seconds between repeats. Do _2__ times per day.  Copyright  VHI. All rights reserved.

## 2016-12-28 NOTE — Therapy (Signed)
[facility] [address] [place], [place], [address] Phone: [phone]   Fax:  [phone]  Physical Therapy Evaluation  Patient Details  Name: Kayla Archer MRN: [id number] Date of Birth: 1966-05-11 Referring Provider: Antoine Primas, DO  Encounter Date: 12/28/2016      PT End of Session - 12/28/16 0952    Visit Number 1   Date for PT Re-Evaluation 02/22/17   PT Start Time 0851   PT Stop Time 0932   PT Time Calculation (min) 41 min   Activity Tolerance Patient tolerated treatment well   Behavior During Therapy [facility] for tasks assessed/performed      Past Medical History:  Diagnosis Date  . Cartilage tear    rt knee  . Heart murmur   . Medical history non-contributory    see valve rep-  . PONV (postoperative nausea and vomiting)    plus hypotension    Past Surgical History:  Procedure Laterality Date  . 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 KNEE ARTHROSCOPY Right 12/28/2013   Procedure: ARTHROSCOPY KNEE;  Surgeon: Dannielle Huh, MD;  Location: [facility] OR;  Service: Orthopedics;  Laterality: Right;  . LABIOPLASTY Bilateral 10/04/2016   Procedure: LABIAL REDUCTION;  Surgeon: Marcelle Overlie, MD;  Location: [facility];  Service: Gynecology;  Laterality: Bilateral;    There were no vitals filed for this visit.       Subjective Assessment - 12/28/16 0853    Subjective Onset 12-13 weeks ago, no prior hisory of back pain.  She doesn't remember anything specific but bent forward in the shower and then noticed low back pain.  At Thanksgiving played tennis and it increased and had very severe pain when reaching for a ball. She continued to play a little, but later that night couldn't move.  Has been seeing Dr. Katrinka Blazing and gotten adjustments with relief but is in pain after about 4 days.  Did a round of prednisone.  She notices now that when she is standing for long periods of time is  irritating and has spasms   Limitations Standing;Sitting   How long can you sit comfortably? sometimes can't sit at all   How long can you stand comfortably? 1.5 to 2 hours   How long can you walk comfortably? only walked twice since back pain started and it irritates it   Currently in Pain? 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  No   Is the patient reluctant to leave their home because of a fear of falling?  No     Home Environment   Living Environment Private residence   Type of Home House   Home Access Stairs to enter   Home Layout Two level     Prior Function   Vocation Full time employment  Nurse   Leisure tennis, walking, pilates, yoga     Cognition   Overall Cognitive Status Within Functional Limits for tasks assessed     Observation/Other Assessments   Focus on Therapeutic Outcomes (FOTO)  49% limitation     Posture/Postural Control   Posture/Postural Control Postural limitations   Postural  Limitations Rounded Shoulders;Increased thoracic kyphosis     AROM   Lumbar Flexion 75% limitation   Lumbar Extension 25% limitation     Strength   Right Hip Extension 5/5   Right Hip Internal Rotation 4+/5   Right Hip ABduction 4/5   Right Hip ADduction 4/5   Left Hip Extension 4-/5   Left Hip External Rotation 4+/5   Left Hip ABduction 4+/5   Left Hip ADduction 4-/5     Palpation   Palpation comment Lt lumbar erectors tight and tender to palpation     Special Tests    Special Tests --  straight leg test positive Lt     Ambulation/Gait   Gait Pattern Within Functional Limits                           PT Education - 12/28/16 0941    Education provided Yes   Education Details abdominal bracing   Person(s) Educated Patient   Methods Explanation;Verbal cues;Tactile cues;Handout   Comprehension Verbalized understanding          PT Short Term Goals - 12/28/16 1139      PT SHORT TERM GOAL #1   Title independent with initial HEP   Time 4   Period Weeks   Status New     PT SHORT TERM GOAL #2   Title pain reduced by 50% during daily activities   Time 4   Period Weeks   Status New     PT SHORT TERM GOAL #3   Title able to turn in bed with no increased pain due to ability to brace core   Time 4   Period Weeks   Status New           PT Long Term Goals - 12/28/16 1140      PT LONG TERM GOAL #1   Title independent with advanced HEP   Time 8   Period Weeks   Status New     PT LONG TERM GOAL #2   Title able to stand and walk around grocery store for >2 hours without increased pain   Time 8   Period Weeks   Status New     PT LONG TERM GOAL #3   Title FOTO < or = to 34% limitation   Time 8   Period Weeks   Status New     PT LONG TERM GOAL #4   Title Able to lift 20 lbs with correct body mechanics and no increased pain   Time 8   Period Weeks   Status New  PT LONG TERM GOAL #5   Title Able to feel confident returning to  pilates workouts   Time 8   Period Weeks   Status New               Plan - 12/28/16 1111    Clinical Impression Statement Pt presents to clinic for low complexity eval due to no comorbidities.  Pt is active 51 y/o female in overall good health.  Pt had onset of low back pain 12 weeks ago which progressively became worse.  Recently she received adjustments and injections which has helped, but she continues to get flared up and those treatments aren't lasting more than about 4 days.  At this time, patient is limited with walking, standing and sitting.  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  Pt reports pain up to 8/10 when it flares up and it is a constant 3/10 pain presently.  States she has muscle cramping up her back.  She has positive straight leg raise test and lumbar flexion reduced by 75%.  No increased symptoms with extension but does have increased symptoms with MMT of Lt hip extension.  Pt has decreased PROM hip internal rotation on Lt of 25% limitation.  FOTO score shows 49% functional limitations.  She presents with hip weaknesses of 4-/5.  Pt also has difficulty coordinating movement with abdominal bracing.  Pt will benefit from skilled PT to address deficits and improve spinal stability during funcitonal movments.   Rehab Potential Excellent   Clinical Impairments Affecting Rehab Potential none   PT Frequency 2x / week   PT Duration 8 weeks   PT Treatment/Interventions Biofeedback;Cryotherapy;Electrical Stimulation;Moist Heat;Traction;Therapeutic activities;Therapeutic exercise;Neuromuscular re-education;Manual techniques;Patient/family education   PT Next Visit Plan Progress core strengthening, review bed mobility with bracing, h/s stretching, pelvic floor stretches, ask about incontinence   Recommended Other Services none   Consulted and Agree with Plan of Care Patient      Patient will benefit from skilled therapeutic  intervention in order to improve the following deficits and impairments:  Postural dysfunction, Improper body mechanics, Increased muscle spasms, Pain, Decreased coordination, Decreased range of motion, Decreased strength  Visit Diagnosis: Chronic low back pain, unspecified back pain laterality, with sciatica presence unspecified  Muscle weakness (generalized)     Problem List Patient Active Problem List   Diagnosis Date Noted  . Nonallopathic lesion of lumbosacral region 11/20/2016  . Nonallopathic lesion of sacral region 11/20/2016  . Nonallopathic lesion of thoracic region 11/20/2016  . Left lumbar radiculopathy 11/13/2016  . Mid sternal chest pain 03/31/2014  . Aortic regurgitation 03/31/2014  . S/P aortic valve replacement with bioprosthetic valve 03/31/2014    Vincente Poli, PT 12/28/2016, 11:50 AM  [facility] [address] [place], [place], [address] Phone: [phone]   Fax:  [phone]  Name: JOSLYNN JAMROZ MRN: [id number] Date of Birth: 1966-05-15

## 2017-01-01 ENCOUNTER — Ambulatory Visit: Payer: BLUE CROSS/BLUE SHIELD | Admitting: Physical Therapy

## 2017-01-01 DIAGNOSIS — M6281 Muscle weakness (generalized): Secondary | ICD-10-CM

## 2017-01-01 DIAGNOSIS — G8929 Other chronic pain: Secondary | ICD-10-CM | POA: Diagnosis not present

## 2017-01-01 DIAGNOSIS — M545 Low back pain: Secondary | ICD-10-CM | POA: Diagnosis not present

## 2017-01-01 NOTE — Patient Instructions (Signed)
[name] PT [facility] [address], [address] [place], [place] [address] Phone # [phone] Fax [phone]    

## 2017-01-01 NOTE — Therapy (Signed)
[facility] [address] [place], [place], [address] Phone: [phone]   Fax:  [phone]  Physical Therapy Treatment  Patient Details  Name: Kayla Archer MRN: [id number] Date of Birth: 12-May-1966 Referring Provider: Antoine Primas, DO  Encounter Date: 01/01/2017      PT End of Session - 01/01/17 1155    Visit Number 2   Number of Visits 29   Date for PT Re-Evaluation 02/22/17   Authorization Type [employer] 30 visit limit (1 used already)   PT Start Time 0731   PT Stop Time 0820   PT Time Calculation (min) 49 min   Activity Tolerance Patient tolerated treatment well      Past Medical History:  Diagnosis Date  . Cartilage tear    rt knee  . Heart murmur   . Medical history non-contributory    see valve rep-  . PONV (postoperative nausea and vomiting)    plus hypotension    Past Surgical History:  Procedure Laterality Date  . 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 KNEE ARTHROSCOPY Right 12/28/2013   Procedure: ARTHROSCOPY KNEE;  Surgeon: Dannielle Huh, MD;  [facility];  Service: Orthopedics;  Laterality: Right;  . LABIOPLASTY Bilateral 10/04/2016   Procedure: LABIAL REDUCTION;  Surgeon: Marcelle Overlie, MD;  Location: [facility];  Service: Gynecology;  Laterality: Bilateral;    There were no vitals filed for this visit.      Subjective Assessment - 01/01/17 0730    Subjective Not very good b/c I didn't sleep well last night.  Lower back pain.  Left buttock pain yesterday.     Currently in Pain? Yes   Pain Score 5    Pain Location Back   Pain Orientation Right;Left   Aggravating Factors  standing;  bending over                         [facility] Adult PT Treatment/Exercise - 01/01/17 0001      Posture/Postural Control   Postural Limitations --  discussed limiting flexion   Posture Comments instruction in sitting with lumbar roll;  stand up every 20  min     Exercises   Exercises --  discussed shorter duration walks;discussed wait on [employer]     Lumbar Exercises: Prone   Other Prone Lumbar Exercises prop on elbows 3 min   Other Prone Lumbar Exercises Press up 10x; then 5x with gentle overpressure     Moist Heat Therapy   Number Minutes Moist Heat 12 Minutes   Moist Heat Location Lumbar Spine     Electrical Stimulation   Electrical Stimulation Location lumbar   Electrical Stimulation Action IFC   Electrical Stimulation Parameters 8 ma 15 min supine   Electrical Stimulation Goals Pain                PT Education - 01/01/17 [id number]    Education provided Yes   Education Details posture sitting education;  prone press ups;  walking   Person(s) Educated Patient   Methods Explanation;Demonstration;Handout   Comprehension Verbalized understanding;Returned demonstration          PT Short Term Goals - 01/01/17 1213      PT SHORT TERM GOAL #1   Title independent with initial HEP   Time 4   Period Weeks   Status On-going     PT SHORT TERM GOAL #2   Title pain  reduced by 50% during daily activities   Time 4   Period Weeks   Status On-going     PT SHORT TERM GOAL #3   Title able to turn in bed with no increased pain due to ability to brace core   Time 4   Period Weeks   Status On-going           PT Long Term Goals - 01/01/17 1213      PT LONG TERM GOAL #1   Title independent with advanced HEP   Time 8   Period Weeks   Status On-going     PT LONG TERM GOAL #2   Title able to stand and walk around grocery store for >2 hours without increased pain   Time 8   Period Weeks   Status On-going     PT LONG TERM GOAL #3   Title FOTO < or = to 34% limitation   Time 8   Period Weeks   Status On-going     PT LONG TERM GOAL #4   Title Able to lift 20 lbs with correct body mechanics and no increased pain   Status On-going     PT LONG TERM GOAL #5   Title Able to feel confident returning to pilates  workouts   Time 8   Period Weeks   Status On-going               Plan - 01/01/17 1156    Clinical Impression Statement Patient expresses frustration over continued back and buttock pain for 3 1/2 months with exacerbations where she needs assistance "to get out of [employer] or even out of the chair" at times.  Her symptomare centralized with prone press ups although she is unable to achieve full extension.  Discussed temporary flexion avoidance including limiting sitting for prolonged periods and trial of press ups 10x every 2 hours.  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  Patient has excellent relief from e-stim/heat.  Therapist closely monitoring response with all interventions.      PT Next Visit Plan assess response to trial of prone press ups, sitting limitation;  continue with centralization methods;  e-stim/heat for pain control      Patient will benefit from skilled therapeutic intervention in order to improve the following deficits and impairments:     Visit Diagnosis: Chronic low back pain, unspecified back pain laterality, with sciatica presence unspecified  Muscle weakness (generalized)     Problem List Patient Active Problem List   Diagnosis Date Noted  . Nonallopathic lesion of lumbosacral region 11/20/2016  . Nonallopathic lesion of sacral region 11/20/2016  . Nonallopathic lesion of thoracic region 11/20/2016  . Left lumbar radiculopathy 11/13/2016  . Mid sternal chest pain 03/31/2014  . Aortic regurgitation 03/31/2014  . S/P aortic valve replacement with bioprosthetic valve 03/31/2014   Kayla SharpsStacy Archer, PT 01/01/17 12:15 PM Phone: [phone] Fax: [phone]  Vivien PrestoSimpson, Kayla C 01/01/2017, 12:15 PM  [facility] [address] [place], [place], [address] Phone: [phone]   Fax:   [phone]  Name: Kayla BullocksMitrina A Archer MRN: [id number] Date of Birth: Jul 17, 1966

## 2017-01-04 ENCOUNTER — Encounter: Payer: BLUE CROSS/BLUE SHIELD | Admitting: Physical Therapy

## 2017-01-07 ENCOUNTER — Ambulatory Visit: Payer: BLUE CROSS/BLUE SHIELD | Admitting: Physical Therapy

## 2017-01-07 DIAGNOSIS — G8929 Other chronic pain: Secondary | ICD-10-CM

## 2017-01-07 DIAGNOSIS — M545 Low back pain: Principal | ICD-10-CM

## 2017-01-07 DIAGNOSIS — M6281 Muscle weakness (generalized): Secondary | ICD-10-CM

## 2017-01-07 NOTE — Patient Instructions (Signed)
(  Home) Extension: Hip    With support under abdomen, tighten stomach. Lift right leg in line with body. Do not hyperextend. Alternate legs. Repeat _10 x   PELVIC STABILIZATION: Single Foot Touch    With knees bent feet resting on the floor. Bring one foot 2 inches off the floor while keeping abdominals braced.  Lower leg and Repeat with other leg. Repeat _20__ times, alternating legs. Do _1__ times per day.    Copyright  VHI. All rights reserved.     Abduction: Clam (Eccentric) - Side-Lying    Lie on side with knees bent. Lift top knee, keeping feet together. Keep trunk steady. Slowly lower for 3-5 seconds. _10__ reps per set, _2__ sets per day, http://ecce.exer.us/65   Copyright  VHI. All rights reserved.   Hamstring Stretch (Standing)    Standing, place one heel on chair or small step. Use one or both hands on thigh for support. Keeping torso straight, (stick butt out in the back) lean forward slowly until a stretch is felt in back of same thigh. Hold __30__ seconds. Repeat with other leg. 3x each side throughout the day  Copyright  VHI. All rights reserved.     Do back extension/cobra throughout when feeling any back or glute pain

## 2017-01-07 NOTE — Therapy (Addendum)
[facility] [address], [place], [place], [address] Phone: [phone]   Fax:  [phone]  Physical Therapy Treatment  Patient Details  Name: Kayla Archer MRN: [id number] Date of Birth: [birth year] Referring Provider: Hulan Saas, DO  Encounter Date: 01/07/2017      PT End of Session - 01/07/17 1625    Visit Number 3   Number of Visits 29   Date for PT Re-Evaluation 02/22/17   Authorization Type BCBS 30 visit limit (1 used already)   PT Start Time 1618   PT Stop Time 1720   PT Time Calculation (min) 62 min   Activity Tolerance Patient tolerated treatment well   Behavior During Therapy [facility] for tasks assessed/performed      Past Medical History:  Diagnosis Date  . Cartilage tear    rt knee  . Heart murmur   . Medical history non-contributory    see valve rep-  . PONV (postoperative nausea and vomiting)    plus hypotension    Past Surgical History:  Procedure Laterality Date  . 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 KNEE ARTHROSCOPY Right 12/28/2013   Procedure: ARTHROSCOPY KNEE;  Surgeon: Vickey Huger, MD;  Location: [place];  Service: Orthopedics;  Laterality: Right;  . LABIOPLASTY Bilateral 10/04/2016   Procedure: LABIAL REDUCTION;  Surgeon: Dian Queen, MD;  Location: [facility];  Service: Gynecology;  Laterality: Bilateral;    There were no vitals filed for this visit.      Subjective Assessment - 01/07/17 1622    Subjective TENS helped and back extension   Limitations Standing;Sitting   Currently in Pain? Yes   Pain Score 2    Pain Location Back   Pain Orientation Right;Left   Pain Descriptors / Indicators Spasm;Constant   Pain Type Chronic pain   Pain Onset More than a month ago   Pain Frequency Constant   Multiple Pain Sites No                         [facility] Adult PT Treatment/Exercise - 01/07/17 0001      Lumbar Exercises:  Stretches   Active Hamstring Stretch 3 reps;20 seconds   Piriformis Stretch 3 reps;10 seconds   Piriformis Stretch Limitations gentle stretch in supine, abdominal bracing     Lumbar Exercises: Seated   Sit to Stand 10 reps  abdominal bracing   Other Seated Lumbar Exercises ball squeeze and hip abduction isometric with transverse abdomins bracing in sitting 5sec hold x 10     Lumbar Exercises: Sidelying   Clam 20 reps     Lumbar Exercises: Prone   Straight Leg Raise 20 reps   Other Prone Lumbar Exercises Press up 10x; then 5x with gentle overpressure     Lumbar Exercises: Quadruped   Single Arm Raise Right;Left;10 reps   Straight Leg Raise 10 reps     Cryotherapy   Number Minutes Cryotherapy 15 Minutes   Cryotherapy Location Lumbar Spine   Type of Cryotherapy Ice pack     Electrical Stimulation   Electrical Stimulation Location lumbar   Electrical Stimulation Action IFC   Electrical Stimulation Parameters to tolerance 15 min   Electrical Stimulation Goals Pain                PT Education - 01/07/17 1710    Education provided Yes   Education Details exercises and hamstring stretch  Person(s) Educated Patient   Methods Explanation;Handout   Comprehension Verbalized understanding          PT Short Term Goals - 01/07/17 1723      PT SHORT TERM GOAL #1   Title independent with initial HEP   Time 4   Period Weeks   Status Achieved     PT SHORT TERM GOAL #2   Title pain reduced by 50% during daily activities   Time 4   Period Weeks   Status On-going     PT SHORT TERM GOAL #3   Title able to turn in bed with no increased pain due to ability to brace core   Time 4   Period Weeks   Status On-going           PT Long Term Goals - 01/01/17 1213      PT LONG TERM GOAL #1   Title independent with advanced HEP   Time 8   Period Weeks   Status On-going     PT LONG TERM GOAL #2   Title able to stand and walk around grocery store for >2 hours  without increased pain   Time 8   Period Weeks   Status On-going     PT LONG TERM GOAL #3   Title FOTO < or = to 34% limitation   Time 8   Period Weeks   Status On-going     PT LONG TERM GOAL #4   Title Able to lift 20 lbs with correct body mechanics and no increased pain   Status On-going     PT LONG TERM GOAL #5   Title Able to feel confident returning to pilates workouts   Time 8   Period Weeks   Status On-going               Plan - 01/07/17 1711    Clinical Impression Statement Pt demonstrates decreased pain and felt better more centralized with prone press ups.  Able to tolerate exercises and do with prone press ups throughout in order to keep pain centralized.  Fatigued at the end of treatment and started to feel tiredness in low back.  Continues to need PT for core strengthening.   Rehab Potential Excellent   Clinical Impairments Affecting Rehab Potential none   PT Frequency 2x / week   PT Duration 8 weeks   PT Treatment/Interventions Biofeedback;Cryotherapy;Electrical Stimulation;Moist Heat;Traction;Therapeutic activities;Therapeutic exercise;Neuromuscular re-education;Manual techniques;Patient/family education   PT Next Visit Plan continue with extension exercises and abdominal bracing, hamstring stretches, estim as needed   Consulted and Agree with Plan of Care Patient      Patient will benefit from skilled therapeutic intervention in order to improve the following deficits and impairments:  Postural dysfunction, Improper body mechanics, Increased muscle spasms, Pain, Decreased coordination, Decreased range of motion, Decreased strength  Visit Diagnosis: Chronic low back pain, unspecified back pain laterality, with sciatica presence unspecified  Muscle weakness (generalized)     Problem List Patient Active Problem List   Diagnosis Date Noted  . Nonallopathic lesion of lumbosacral region 11/20/2016  . Nonallopathic lesion of sacral region 11/20/2016   . Nonallopathic lesion of thoracic region 11/20/2016  . Left lumbar radiculopathy 11/13/2016  . Mid sternal chest pain 03/31/2014  . Aortic regurgitation 03/31/2014  . S/P aortic valve replacement with bioprosthetic valve 03/31/2014    Zannie Cove, PT 01/07/2017, 5:27 PM  [facility] [address], [place], [place], [address] Phone:  [phone]   Fax:  [phone]  Name: Kayla Archer MRN: [id number] Date of Birth: 20-Apr-1966  PHYSICAL THERAPY DISCHARGE SUMMARY  Visits from Start of Care: 3  Current functional level related to goals / functional outcomes: See above goals   Remaining deficits: See above details   Education / Equipment: HEP  Plan: Patient agrees to discharge.  Patient goals were not met. Patient is being discharged due to not returning since the last visit.  ?????

## 2017-01-17 ENCOUNTER — Ambulatory Visit: Payer: BLUE CROSS/BLUE SHIELD | Admitting: Family Medicine

## 2017-01-24 ENCOUNTER — Ambulatory Visit (INDEPENDENT_AMBULATORY_CARE_PROVIDER_SITE_OTHER): Payer: BLUE CROSS/BLUE SHIELD | Admitting: Family Medicine

## 2017-01-24 ENCOUNTER — Encounter: Payer: Self-pay | Admitting: Family Medicine

## 2017-01-24 VITALS — BP 108/68 | HR 64 | Ht 64.0 in | Wt 144.0 lb

## 2017-01-24 DIAGNOSIS — M999 Biomechanical lesion, unspecified: Secondary | ICD-10-CM | POA: Diagnosis not present

## 2017-01-24 DIAGNOSIS — M5416 Radiculopathy, lumbar region: Secondary | ICD-10-CM | POA: Diagnosis not present

## 2017-01-24 NOTE — Assessment & Plan Note (Signed)
Patient is having less radicular symptoms at this time. We discussed once again about the potential for MRI. Patient like to hold off and continue with conservative therapy. We discussed with patient icing regimen. Discussed the over-the-counter medications. Patient does respond very well to manipulation. Follow-up again in 4 weeks.

## 2017-01-24 NOTE — Patient Instructions (Signed)
Good to see you  Kayla Archer is your friend.  PT still for a couple more visits.  Continue the exercises See me again in 4 weeks.

## 2017-01-24 NOTE — Progress Notes (Signed)
Tawana ScaleZach Magdalene Tardiff D.O. [facility] [address] [place], [place] [address] Phone: [phone] Subjective:    CC: low back pain f/u   BJY:NWGNFAOZHYHPI:Subjective  [name] is a 51 y.o. female coming in with complaint of low back pain. Patient states it seems to be more left-sided. Patient was found to have more of a lumbar radiculopathy. Patient states that she is not having any significant radicular symptoms at this time. Still some mild discomfort in the buttocks area. Patient was doing much better and was doing physical therapy. No weakness. Patient states that the manipulation has been beneficial overall.   patient did have x-rays of the lumbar spine 11/13/2016. This is independent living visualized by me. X-rays show no significant bony normality.  Past Medical History:  Diagnosis Date  . Cartilage tear    rt knee  . Heart murmur   . Medical history non-contributory    see valve rep-  . PONV (postoperative nausea and vomiting)    plus hypotension   Past Surgical History:  Procedure Laterality Date  . 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 KNEE ARTHROSCOPY Right 12/28/2013   Procedure: ARTHROSCOPY KNEE;  Surgeon: Dannielle HuhSteve Lucey, MD;  [facility];  Service: Orthopedics;  Laterality: Right;  . LABIOPLASTY Bilateral 10/04/2016   Procedure: LABIAL REDUCTION;  Surgeon: Marcelle OverlieMichelle Grewal, MD;  Location: [facility]S;  Service: Gynecology;  Laterality: Bilateral;   Social History   Social History  . Marital status: Married    Spouse name: N/A  . Number of children: N/A  . Years of education: N/A   Social History Main Topics  . Smoking status: Never Smoker  . Smokeless tobacco: Never Used  . Alcohol use 1.8 oz/week    3 Glasses of wine per week  . Drug use: No  . Sexual activity: Not Asked   Other Topics Concern  . None   Social History Narrative  . None   No Known Allergies Family History  Problem Relation Age of Onset  . Hypertension  Mother   . Diabetes type II Mother   . Other Mother     NONHODGKINS LYMPHOMA  . Stroke Father   . Heart attack Father   . Other Sister     PRIMARY PULMONARY HTN  . Healthy Brother   . Hypertension Sister   . Diabetes type II Sister   . Hypertension Sister   . Diabetes type II Sister     Past medical history, social, surgical and family history all reviewed in electronic medical record.  No pertanent information unless stated regarding to the chief complaint.   Review of Systems: No headache, visual changes, nausea, vomiting, diarrhea, constipation, dizziness, abdominal pain, skin rash, fevers, chills, night sweats, weight loss, swollen lymph nodes, body aches, joint swelling, muscle aches, chest pain, shortness of breath, mood changes.    Objective  Blood pressure 108/68, pulse 64, height 5\' 4"  (1.626 m), weight 144 lb (65.3 kg), SpO2 99 %.   Systems examined below as of 01/24/17 General: NAD A&O x3 mood, affect normal  HEENT: Pupils equal, extraocular movements intact no nystagmus Respiratory: not short of breath at rest or with speaking Cardiovascular: No lower extremity edema, non tender Skin: Warm dry intact with no signs of infection or rash on extremities or on axial skeleton. Abdomen: Soft nontender, no masses Neuro: Cranial nerves  intact, neurovascularly intact in all extremities with 2+ DTRs and 2+ pulses. Lymph: No lymphadenopathy appreciated  today  Gait normal with good balance and coordination.  MSK: Non tender with full range of motion and good stability and symmetric strength and tone of shoulders, elbows, wrist,  knee hips and ankles bilaterally.     Back Exam:  Inspection: Unremarkable  Motion: Flexion 25 deg with Still mild worsening symptoms of the radicular symptoms down the left leg no change from previous exam. Extension 15 deg, Side Bending to 25 deg bilaterally,  Rotation to 25 deg bilaterally  SLR laying: Negative today XSLR laying: still has some mild  positive crossover test as well.  Palpable tenderness: Continue mild discomfort over the L5 region on the left side FABER: negative. Mild discomfort on the left side but improved Sensory change: Gross sensation intact to all lumbar and sacral dermatomes.  Reflexes: 2+ at both patellar tendons, 2+ at achilles tendons, Babinski's downgoing.  Strength at foot  Still mild weakness on the left side with dorsiflexion and plantarflexion on the left compared to the right side  Osteopathic findings T3 extended rotated and side bent right inhaled third rib T8 extended rotated and side bent left L3 flexed rotated and side bent right Sacrum right on right      Impression and Recommendations:     This case required medical decision making of moderate complexity.      Note: This dictation was prepared with Dragon dictation along with smaller phrase technology. Any transcriptional errors that result from this process are unintentional.

## 2017-01-24 NOTE — Assessment & Plan Note (Signed)
Decision today to treat with OMT was based on Physical Exam  After verbal consent patient was treated with HVLA, ME, FPR techniques in  thoracic, lumbar and sacral areas  Patient tolerated the procedure well with improvement in symptoms  Patient given exercises, stretches and lifestyle modifications  See medications in patient instructions if given  Patient will follow up in 4 weeks 

## 2017-02-01 ENCOUNTER — Encounter: Payer: Self-pay | Admitting: Interventional Cardiology

## 2017-02-01 ENCOUNTER — Encounter: Payer: Self-pay | Admitting: *Deleted

## 2017-02-01 ENCOUNTER — Ambulatory Visit (INDEPENDENT_AMBULATORY_CARE_PROVIDER_SITE_OTHER): Payer: BLUE CROSS/BLUE SHIELD | Admitting: Interventional Cardiology

## 2017-02-01 VITALS — BP 136/70 | HR 67 | Ht 64.0 in | Wt 140.4 lb

## 2017-02-01 DIAGNOSIS — I351 Nonrheumatic aortic (valve) insufficiency: Secondary | ICD-10-CM

## 2017-02-01 DIAGNOSIS — I10 Essential (primary) hypertension: Secondary | ICD-10-CM | POA: Diagnosis not present

## 2017-02-01 DIAGNOSIS — Z953 Presence of xenogenic heart valve: Secondary | ICD-10-CM

## 2017-02-01 MED ORDER — AMLODIPINE BESYLATE 2.5 MG PO TABS: 2.5000 mg | ORAL_TABLET | Freq: Every day | ORAL | 3 refills | Status: DC

## 2017-02-01 NOTE — Progress Notes (Signed)
 Cardiology Office Note    Date:  02/01/2017   ID:  Kayla Archer, DOB 11/01/1966, MRN [id number]  PCP:  No PCP Per Patient  Cardiologist: Henry W Smith III, MD   Chief Complaint  Patient presents with  . Cardiac Valve Problem    AV regurg    History of Present Illness:  Kayla Archer is a 50 y.o. female for follow-up of aortic valve replacement for bicuspid aortic valve, bioprosthetic valve and ascending aortic replacementp in 2004 at the [facility]. On the last visit we identified bioprosthetic valve dysfunction. She is back earlier than usual for follow-up to ensure there has not been rapid progression.  Now having occasional 2 pillow orthopnea, exertional fatigue, and difficulty with rare episodes of PND. She is trying to discern if this is due to stress at work. She wakes up frequently at night thinking about work, being unable to turn her brain off.  No chest discomfort. No episodes of syncope. No lower extremity swelling. No tachycardia/palpitations.  Past Medical History:  Diagnosis Date  . Cartilage tear    rt knee  . Heart murmur   . Medical history non-contributory    see valve rep-  . PONV (postoperative nausea and vomiting)    plus hypotension    Past Surgical History:  Procedure Laterality Date  . 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 KNEE ARTHROSCOPY Right 12/28/2013   Procedure: ARTHROSCOPY KNEE;  Surgeon: Steve Lucey, MD;  Location: [facility];  Service: Orthopedics;  Laterality: Right;  . LABIOPLASTY Bilateral 10/04/2016   Procedure: LABIAL REDUCTION;  Surgeon: Michelle Grewal, MD;  Location: [facility];  Service: Gynecology;  Laterality: Bilateral;    Current Medications: Outpatient Medications Prior to Visit  Medication Sig Dispense Refill  . ibuprofen (MOTRIN IB) 200 MG tablet Take 3 tablets (600 mg total) by mouth every 6 (six) hours as needed. (Patient not taking: Reported on 02/01/2017) 30 tablet 0   No  facility-administered medications prior to visit.      Allergies:   Patient has no known allergies.   Social History   Social History  . Marital status: Married    Spouse name: N/A  . Number of children: N/A  . Years of education: N/A   Social History Main Topics  . Smoking status: Never Smoker  . Smokeless tobacco: Never Used  . Alcohol use 1.8 oz/week    3 Glasses of wine per week  . Drug use: No  . Sexual activity: Not Asked   Other Topics Concern  . None   Social History Narrative  . None     Family History:  The patient's family history includes Diabetes type II in her mother, sister, and sister; Healthy in her brother; Heart attack in her father; Hypertension in her mother, sister, and sister; Other in her mother and sister; Stroke in her father.   ROS:   Please see the history of present illness.    Anxiety and stress related to her job.  All other systems reviewed and are negative.   PHYSICAL EXAM:   VS:  BP 136/70 (BP Location: Left Arm)   Pulse 67   Ht 5' 4" (1.626 m)   Wt 140 lb 6.4 oz (63.7 kg)   BMI 24.10 kg/m    GEN: Well nourished, well developed, in no acute distress  HEENT: normal  Neck: no JVD, carotid bruits, or masses Cardiac: RRR; 3/6 murmur and 3/6 decrescendo AR   murmur. No rub, or gallops,no edema.  Respiratory:  clear to auscultation bilaterally, normal work of breathing GI: soft, nontender, nondistended, + BS MS: no deformity or atrophy  Skin: warm and dry, no rash Neuro:  Alert and Oriented x 3, Strength and sensation are intact Psych: euthymic mood, full affect  Wt Readings from Last 3 Encounters:  02/01/17 140 lb 6.4 oz (63.7 kg)  01/24/17 144 lb (65.3 kg)  11/20/16 141 lb (64 kg)      Studies/Labs Reviewed:   EKG:  EKG  Not repeated  Recent Labs: 09/07/2016: BUN 11; Creatinine, Ser 0.66; Hemoglobin 13.3; Platelets 231; Potassium 4.1; Sodium 139   Lipid Panel No results found for: CHOL, TRIG, HDL, CHOLHDL, VLDL, LDLCALC,  LDLDIRECT  Additional studies/ records that were reviewed today include:  Echocardiogram 07/25/16: Study Conclusions  - Left ventricle: The cavity size was normal. Systolic function was   vigorous. The estimated ejection fraction was in the range of 65%   to 70%. Wall motion was normal; there were no regional wall   motion abnormalities. Doppler parameters are consistent with   abnormal left ventricular relaxation (grade 1 diastolic   dysfunction). Doppler parameters are consistent with elevated   ventricular end-diastolic filling pressure. - Aortic valve: There was moderate stenosis. There was mild to   moderate regurgitation. Mean gradient (S): 33 mm Hg. Peak   gradient (S): 60 mm Hg. Valve area (VTI): 0.93 cm^2. Valve area   (Vmax): 0.74 cm^2. Valve area (Vmean): 0.8 cm^2. - Ascending aorta: The ascending aorta was normal in size. - Right ventricle: Systolic function was normal. - Right atrium: The atrium was normal in size. - Tricuspid valve: There was mild regurgitation. - Pulmonary arteries: Systolic pressure was within the normal   range. - Inferior vena cava: The vessel was normal in size. The   respirophasic diameter changes were in the normal range (= 50%),   consistent with normal central venous pressure. - Pericardium, extracardiac: There was no pericardial effusion.  Impressions:  - When compared to the prior study from 03/23/2016, the transaortic   gradients across the bioprosthetic aortic valve are now elevated.   Currently peak/mean 60/33 mmHg in the moderate aortic stenosis   range, previously normal with peak/mean 26/17 mmHg.   Leaflets have limited visualization, an additional evaluation   with TEE or a cardiac CT is recommended for evaluation of valve   thrombosis.   ASSESSMENT:    1. S/P aortic valve replacement with bioprosthetic valve   2. Aortic valve insufficiency, etiology of cardiac valve disease unspecified   3. Systolic hypertension       PLAN:  In order of problems listed above:  1. Progressive symptoms suggest worsening dysfunction of bioprosthetic valve. Plan transesophageal echo, BNP, CBC, and comprehensive metabolic panel today. Referral back to [facility] hopefully to be seen within the next 6 weeks to be scheduled for repeat valve replacement surgery. 2. Elevated systolic pressure greater than 170 mmHg today. Start amlodipine 2.5 mg per day. If BNP is elevated, we will add a low-dose diuretic. The increased pressure is related to increasing severity of aortic regurgitation. 3. Transesophageal echo followed by referral to [facility] for re-do aortic valve surgery.  Needs to have mechanical valve this time.     Medication Adjustments/Labs and Tests Ordered: Current medicines are reviewed at length with the patient today.  Concerns regarding medicines are outlined above.  Medication changes, Labs and Tests ordered today are listed in the Patient Instructions below. Patient   Instructions  Medication Instructions:  1) START Amlodipine 2.5mg once daily  Labwork: None  Testing/Procedures: None  Follow-Up: Your physician recommends that you schedule a follow-up appointment in: 6 weeks if unable to get into [facility].  Dr. Smith would like for you to see someone in the [facility] within the next 6 weeks.    Any Other Special Instructions Will Be Listed Below (If Applicable).     If you need a refill on your cardiac medications before your next appointment, please call your pharmacy.      Signed, Henry W Smith III, MD  02/01/2017 5:34 PM    [facility] [address], [place], [place]  [address] Phone: [phone]; Fax: [phone]  

## 2017-02-01 NOTE — Patient Instructions (Signed)
Medication Instructions:  1) START Amlodipine 2.5mg  once daily  Labwork: None  Testing/Procedures: None  Follow-Up: Your physician recommends that you schedule a follow-up appointment in: 6 weeks if unable to get into [facility].  Dr. Katrinka BlazingSmith would like for you to [facility] within the next 6 weeks.    Any Other Special Instructions Will Be Listed Below (If Applicable).     If you need a refill on your cardiac medications before your next appointment, please call your pharmacy.

## 2017-02-02 LAB — CBC
Hematocrit: 40.2 % (ref 34.0–46.6)
Hemoglobin: 13.5 g/dL (ref 11.1–15.9)
MCH: 31.8 pg (ref 26.6–33.0)
MCHC: 33.6 g/dL (ref 31.5–35.7)
MCV: 95 fL (ref 79–97)
PLATELETS: 250 10*3/uL (ref 150–379)
RBC: 4.24 x10E6/uL (ref 3.77–5.28)
RDW: 12.9 % (ref 12.3–15.4)
WBC: 9.5 10*3/uL (ref 3.4–10.8)

## 2017-02-02 LAB — BASIC METABOLIC PANEL
BUN / CREAT RATIO: 14 (ref 9–23)
BUN: 10 mg/dL (ref 6–24)
CO2: 22 mmol/L (ref 18–29)
Calcium: 9.4 mg/dL (ref 8.7–10.2)
Chloride: 102 mmol/L (ref 96–106)
Creatinine, Ser: 0.69 mg/dL (ref 0.57–1.00)
GFR, EST AFRICAN AMERICAN: 116 mL/min/{1.73_m2} (ref >59)
GFR, EST NON AFRICAN AMERICAN: 100 mL/min/{1.73_m2} (ref >59)
Glucose: 94 mg/dL (ref 65–99)
POTASSIUM: 4.2 mmol/L (ref 3.5–5.2)
SODIUM: 141 mmol/L (ref 134–144)

## 2017-02-02 LAB — PRO B NATRIURETIC PEPTIDE: NT-Pro BNP: 41 pg/mL (ref 0–249)

## 2017-02-04 ENCOUNTER — Telehealth: Payer: Self-pay | Admitting: Interventional Cardiology

## 2017-02-04 NOTE — Telephone Encounter (Signed)
New message      Talk to the nurse regarding her last ov and future surgery.  Pt is aware Dr Michaelle CopasSmith's nurse is off today.  OK to call when Dr Michaelle CopasSmith's nurse returns

## 2017-02-05 NOTE — Telephone Encounter (Signed)
[facility] I will get records together and Mail

## 2017-02-05 NOTE — Telephone Encounter (Signed)
Spoke with pt and she was just wanting to make us aware of which doctor at [facility] would be reviewing her records.  She still needs to pick up records and FedEx them overnight.  Pt is going to fax over the [facility].  Provided Medical records fax number. Will forward to Medical Records to make them aware as well.

## 2017-02-05 NOTE — Telephone Encounter (Signed)
Spoke with Kayla Archer and she states that [facility] is requiring records and information for referral.  She said we could go to web site and in the cardiothoracic section there was information of what they needed.  She also provided phone number of surgery coordinator, Marylene Landngela McFarren [phone].  Kayla Archer will need to pick up needed medical records and [facility] them to [facility] as this is their requirement.  I have left a message for Marylene Landngela to call our office back so we can find out what all she needs.

## 2017-02-05 NOTE — Telephone Encounter (Signed)
New Message     Pt called back Send documents to Dr Julianne RiceMarc Gillinov -Cardiac Surgery [facility] [address] [place] [id number]

## 2017-02-08 ENCOUNTER — Ambulatory Visit (HOSPITAL_BASED_OUTPATIENT_CLINIC_OR_DEPARTMENT_OTHER)
Admission: RE | Admit: 2017-02-08 | Discharge: 2017-02-08 | Disposition: A | Discharge status: Home / Self Care | Payer: BLUE CROSS/BLUE SHIELD | Source: Ambulatory Visit | Attending: Cardiovascular Disease | Admitting: Cardiovascular Disease

## 2017-02-08 ENCOUNTER — Encounter (HOSPITAL_COMMUNITY): Payer: Self-pay | Admitting: *Deleted

## 2017-02-08 ENCOUNTER — Ambulatory Visit (HOSPITAL_COMMUNITY)
Admission: RE | Admit: 2017-02-08 | Discharge: 2017-02-08 | Disposition: A | Discharge status: Home / Self Care | Payer: BLUE CROSS/BLUE SHIELD | Source: Ambulatory Visit | Attending: Cardiovascular Disease | Admitting: Cardiovascular Disease

## 2017-02-08 ENCOUNTER — Encounter (HOSPITAL_COMMUNITY)
Admission: RE | Disposition: A | Discharge status: Home / Self Care | Payer: Self-pay | Source: Ambulatory Visit | Attending: Cardiovascular Disease

## 2017-02-08 DIAGNOSIS — Y831 Surgical operation with implant of artificial internal device as the cause of abnormal reaction of the patient, or of later complication, without mention of misadventure at the time of the procedure: Secondary | ICD-10-CM | POA: Insufficient documentation

## 2017-02-08 DIAGNOSIS — Q211 Atrial septal defect: Secondary | ICD-10-CM | POA: Insufficient documentation

## 2017-02-08 DIAGNOSIS — T82857A Stenosis of cardiac prosthetic devices, implants and grafts, initial encounter: Secondary | ICD-10-CM

## 2017-02-08 DIAGNOSIS — I10 Essential (primary) hypertension: Secondary | ICD-10-CM | POA: Insufficient documentation

## 2017-02-08 DIAGNOSIS — I350 Nonrheumatic aortic (valve) stenosis: Secondary | ICD-10-CM | POA: Diagnosis not present

## 2017-02-08 DIAGNOSIS — Z953 Presence of xenogenic heart valve: Secondary | ICD-10-CM | POA: Insufficient documentation

## 2017-02-08 HISTORY — PX: TEE WITHOUT CARDIOVERSION: SHX5443

## 2017-02-08 SURGERY — ECHOCARDIOGRAM, TRANSESOPHAGEAL
Anesthesia: Moderate Sedation

## 2017-02-08 MED ORDER — MIDAZOLAM HCL 10 MG/2ML IJ SOLN
INTRAMUSCULAR | Status: DC | PRN
  Administered 2017-02-08 (×2): 2 mg via INTRAVENOUS
  Administered 2017-02-08: 1 mg via INTRAVENOUS
  Administered 2017-02-08: 2 mg via INTRAVENOUS

## 2017-02-08 MED ORDER — BUTAMBEN-TETRACAINE-BENZOCAINE 2-2-14 % EX AERO
INHALATION_SPRAY | CUTANEOUS | Status: DC | PRN
  Administered 2017-02-08: 2 via TOPICAL

## 2017-02-08 MED ORDER — FENTANYL CITRATE (PF) 100 MCG/2ML IJ SOLN
INTRAMUSCULAR | Status: DC | PRN
  Administered 2017-02-08 (×3): 25 ug via INTRAVENOUS

## 2017-02-08 MED ORDER — MIDAZOLAM HCL 5 MG/ML IJ SOLN
INTRAMUSCULAR | Status: AC
Start: 2017-02-08 — End: 2017-02-08
  Filled 2017-02-08: qty 2

## 2017-02-08 MED ORDER — DIPHENHYDRAMINE HCL 50 MG/ML IJ SOLN
INTRAMUSCULAR | Status: AC
  Filled 2017-02-08: qty 1

## 2017-02-08 MED ORDER — SODIUM CHLORIDE 0.9 % IV SOLN
INTRAVENOUS | Status: DC
  Administered 2017-02-08: 09:00:00 via INTRAVENOUS

## 2017-02-08 MED ORDER — FENTANYL CITRATE (PF) 100 MCG/2ML IJ SOLN
INTRAMUSCULAR | Status: AC
  Filled 2017-02-08: qty 2

## 2017-02-08 NOTE — Discharge Instructions (Signed)

## 2017-02-08 NOTE — Progress Notes (Signed)
  Echocardiogram Echocardiogram Transesophageal has been performed.  Delcie RochENNINGTON, Haylyn Halberg 02/08/2017, 11:15 AM

## 2017-02-08 NOTE — H&P (View-Only) (Signed)
Cardiology Office Note    Date:  02/01/2017   ID:  Willette ClusterMitrina A Archer, DOB August 18, 1966, MRN [id number]  PCP:  No PCP Per Patient  Cardiologist: Lesleigh NoeHenry W Katsumi Wisler III, MD   Chief Complaint  Patient presents with  . Cardiac Valve Problem    AV regurg    History of Present Illness:  Kayla Archer is a 51 y.o. female for follow-up of aortic valve replacement for bicuspid aortic valve, bioprosthetic valve and ascending aortic replacementp in [facility]. On the last visit we identified bioprosthetic valve dysfunction. She is back earlier than usual for follow-up to ensure there has not been rapid progression.  Now having occasional 2 pillow orthopnea, exertional fatigue, and difficulty with rare episodes of PND. She is trying to discern if this is due to stress at work. She wakes up frequently at night thinking about work, being unable to turn her brain off.  No chest discomfort. No episodes of syncope. No lower extremity swelling. No tachycardia/palpitations.  Past Medical History:  Diagnosis Date  . Cartilage tear    rt knee  . Heart murmur   . Medical history non-contributory    see valve rep-  . PONV (postoperative nausea and vomiting)    plus hypotension    Past Surgical History:  Procedure Laterality Date  . 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 KNEE ARTHROSCOPY Right 12/28/2013   Procedure: ARTHROSCOPY KNEE;  Surgeon: Dannielle HuhSteve Lucey, MD;  [facility];  Service: Orthopedics;  Laterality: Right;  . LABIOPLASTY Bilateral 10/04/2016   Procedure: LABIAL REDUCTION;  Surgeon: Marcelle OverlieMichelle Grewal, MD;  Location: [facility];  Service: Gynecology;  Laterality: Bilateral;    Current Medications: Outpatient Medications Prior to Visit  Medication Sig Dispense Refill  . ibuprofen (MOTRIN IB) 200 MG tablet Take 3 tablets (600 mg total) by mouth every 6 (six) hours as needed. (Patient not taking: Reported on 02/01/2017) 30 tablet 0   No  facility-administered medications prior to visit.      Allergies:   Patient has no known allergies.   Social History   Social History  . Marital status: Married    Spouse name: N/A  . Number of children: N/A  . Years of education: N/A   Social History Main Topics  . Smoking status: Never Smoker  . Smokeless tobacco: Never Used  . Alcohol use 1.8 oz/week    3 Glasses of wine per week  . Drug use: No  . Sexual activity: Not Asked   Other Topics Concern  . None   Social History Narrative  . None     Family History:  The patient's family history includes Diabetes type II in her mother, sister, and sister; Healthy in her brother; Heart attack in her father; Hypertension in her mother, sister, and sister; Other in her mother and sister; Stroke in her father.   ROS:   Please see the history of present illness.    Anxiety and stress related to her job.  All other systems reviewed and are negative.   PHYSICAL EXAM:   VS:  BP 136/70 (BP Location: Left Arm)   Pulse 67   Ht 5\' 4"  (1.626 m)   Wt 140 lb 6.4 oz (63.7 kg)   BMI 24.10 kg/m    GEN: Well nourished, well developed, in no acute distress  HEENT: normal  Neck: no JVD, carotid bruits, or masses Cardiac: RRR; 3/6 murmur and 3/6 decrescendo AR  murmur. No rub, or gallops,no edema.  Respiratory:  clear to auscultation bilaterally, normal work of breathing GI: soft, nontender, nondistended, + BS MS: no deformity or atrophy  Skin: warm and dry, no rash Neuro:  Alert and Oriented x 3, Strength and sensation are intact Psych: euthymic mood, full affect  Wt Readings from Last 3 Encounters:  02/01/17 140 lb 6.4 oz (63.7 kg)  01/24/17 144 lb (65.3 kg)  11/20/16 141 lb (64 kg)      Studies/Labs Reviewed:   EKG:  EKG  Not repeated  Recent Labs: 09/07/2016: BUN 11; Creatinine, Ser 0.66; Hemoglobin 13.3; Platelets 231; Potassium 4.1; Sodium 139   Lipid Panel No results found for: CHOL, TRIG, HDL, CHOLHDL, VLDL, LDLCALC,  LDLDIRECT  Additional studies/ records that were reviewed today include:  Echocardiogram 07/25/16: Study Conclusions  - Left ventricle: The cavity size was normal. Systolic function was   vigorous. The estimated ejection fraction was in the range of 65%   to 70%. Wall motion was normal; there were no regional wall   motion abnormalities. Doppler parameters are consistent with   abnormal left ventricular relaxation (grade 1 diastolic   dysfunction). Doppler parameters are consistent with elevated   ventricular end-diastolic filling pressure. - Aortic valve: There was moderate stenosis. There was mild to   moderate regurgitation. Mean gradient (S): 33 mm Hg. Peak   gradient (S): 60 mm Hg. Valve area (VTI): 0.93 cm^2. Valve area   (Vmax): 0.74 cm^2. Valve area (Vmean): 0.8 cm^2. - Ascending aorta: The ascending aorta was normal in size. - Right ventricle: Systolic function was normal. - Right atrium: The atrium was normal in size. - Tricuspid valve: There was mild regurgitation. - Pulmonary arteries: Systolic pressure was within the normal   range. - Inferior vena cava: The vessel was normal in size. The   respirophasic diameter changes were in the normal range (= 50%),   consistent with normal central venous pressure. - Pericardium, extracardiac: There was no pericardial effusion.  Impressions:  - When compared to the prior study from 03/23/2016, the transaortic   gradients across the bioprosthetic aortic valve are now elevated.   Currently peak/mean 60/33 mmHg in the moderate aortic stenosis   range, previously normal with peak/mean 26/17 mmHg.   Leaflets have limited visualization, an additional evaluation   with TEE or a cardiac CT is recommended for evaluation of valve   thrombosis.   ASSESSMENT:    1. S/P aortic valve replacement with bioprosthetic valve   2. Aortic valve insufficiency, etiology of cardiac valve disease unspecified   3. Systolic hypertension       PLAN:  In order of problems listed above:  1. Progressive symptoms suggest worsening dysfunction of bioprosthetic valve. Plan transesophageal echo, BNP, CBC, and comprehensive metabolic panel today. Referral back to [facility] clinic hopefully to be seen within the next 6 weeks to be scheduled for repeat valve replacement surgery. 2. Elevated systolic pressure greater than 170 mmHg today. Start amlodipine 2.5 mg per day. If BNP is elevated, we will add a low-dose diuretic. The increased pressure is related to increasing severity of aortic regurgitation. 3. Transesophageal echo followed by referral to [facility] for re-do aortic valve surgery.  Needs to have mechanical valve this time.     Medication Adjustments/Labs and Tests Ordered: Current medicines are reviewed at length with the patient today.  Concerns regarding medicines are outlined above.  Medication changes, Labs and Tests ordered today are listed in the Patient Instructions below. Patient  Instructions  Medication Instructions:  1) START Amlodipine 2.5mg  once daily  Labwork: None  Testing/Procedures: None  Follow-Up: Your physician recommends that you schedule a follow-up appointment in: 6 weeks if unable to get into [facility].  Dr. Katrinka Blazing would like for you to see someone in the [facility] within the next 6 weeks.    Any Other Special Instructions Will Be Listed Below (If Applicable).     If you need a refill on your cardiac medications before your next appointment, please call your pharmacy.      Signed, Lesleigh Noe, MD  02/01/2017 5:34 PM    [facility] [address] [place], [place], [place]  [address] Phone: [phone]; Fax: [phone]

## 2017-02-08 NOTE — Op Note (Signed)
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 The risks, benefits and alternatives for the procedure were discussed and the patient comprehended these risks.  Risks include, but are not limited to, cough, sore throat, vomiting, nausea, somnolence, esophageal and stomach trauma or perforation, bleeding, low blood pressure, aspiration, pneumonia, infection, trauma to the teeth and death.    After a procedural time-out, the oropharynx was anesthetized with 20% benzocaine spray.   During this procedure the patient was administered a total of Versed 7 mg and Fentanyl 75 mg to achieve and maintain moderate conscious sedation.  The patient's heart rate, blood pressure, and oxygen saturationweare monitored continuously during the procedure. The period of conscious sedation was 18 minutes, of which I was present face-to-face 100% of this time.  The transesophageal probe was inserted in the esophagus and stomach without difficulty and multiple views were obtained.  The patient was kept under observation until the patient left the procedure room.  The patient left the procedure room in stable condition.   Agitated microbubble saline contrast was not administered.  COMPLICATIONS:    There were no immediate complications.  FINDINGS:  Moderate to severe aortic stenosis of a small (probably 19 mm) bioprosthetic valve. 2+ AI. 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 Normal LV function.  Time Spent Directly with the Patient:  30 minutes   Crista Nuon 02/08/2017, 10:21 AM

## 2017-02-08 NOTE — Interval H&P Note (Signed)
History and Physical Interval Note:  02/08/2017 9:10 AM  Kayla Archer  has presented today for surgery, with the diagnosis of aortic stenosis and aortic regurgitation  The various methods of treatment have been discussed with the patient and family. After consideration of risks, benefits and other options for treatment, the patient has consented to  Procedure(s): TRANSESOPHAGEAL ECHOCARDIOGRAM (TEE) (N/A) as a surgical intervention .  The patient's history has been reviewed, patient examined, no change in status, stable for surgery.  I have reviewed the patient's chart and labs.  Questions were answered to the patient's satisfaction.     Shontell Prosser

## 2017-02-11 ENCOUNTER — Encounter (HOSPITAL_COMMUNITY): Payer: Self-pay | Admitting: Cardiovascular Disease

## 2017-02-17 NOTE — Progress Notes (Deleted)
[facility] [address] [place], [place] [address] Phone: [phone] Subjective:    CC: low back pain f/u  GETTING AORTIC VALVE REPLACED BJY:NWGNFAOZHY  Kayla Archer is a 51 y.o. female coming in with complaint of low back pain. Patient states it seems to be more left-sided. Patient was found to have more of a lumbar radiculopathy. Patient states that she is not having any significant radicular symptoms at this time. Still some mild discomfort in the buttocks area. Patient was doing much better and was doing physical therapy. No weakness. Patient states that the manipulation has been beneficial overall.   patient did have x-rays of the lumbar spine 11/13/2016. This is independent living visualized by me. X-rays show no significant bony normality.  Past Medical History:  Diagnosis Date  . Cartilage tear    rt knee  . Heart murmur   . Medical history non-contributory    see valve rep-  . PONV (postoperative nausea and vomiting)    plus hypotension   Past Surgical History:  Procedure Laterality Date  . 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 KNEE ARTHROSCOPY Right 12/28/2013   Procedure: ARTHROSCOPY KNEE;  Surgeon: Dannielle Huh, MD;  Location: [facility] OR;  Service: Orthopedics;  Laterality: Right;  . LABIOPLASTY Bilateral 10/04/2016   Procedure: LABIAL REDUCTION;  Surgeon: Marcelle Overlie, MD;  Location: [facility] ORS;  Service: Gynecology;  Laterality: Bilateral;  . TEE WITHOUT CARDIOVERSION N/A 02/08/2017   Procedure: TRANSESOPHAGEAL ECHOCARDIOGRAM (TEE);  Surgeon: Thurmon Fair, MD;  Location: [facility] ENDOSCOPY;  Service: Cardiovascular;  Laterality: N/A;   Social History   Social History  . Marital status: Married    Spouse name: N/A  . Number of children: N/A  . Years of education: N/A   Social History Main Topics  . Smoking status: Never Smoker  . Smokeless tobacco: Never Used  . Alcohol use 1.8 oz/week    3 Glasses of  wine per week  . Drug use: No  . Sexual activity: Not on file   Other Topics Concern  . Not on file   Social History Narrative  . No narrative on file   No Known Allergies Family History  Problem Relation Age of Onset  . Hypertension Mother   . Diabetes type II Mother   . Other Mother     NONHODGKINS LYMPHOMA  . Stroke Father   . Heart attack Father   . Other Sister     PRIMARY PULMONARY HTN  . Healthy Brother   . Hypertension Sister   . Diabetes type II Sister   . Hypertension Sister   . Diabetes type II Sister     Past medical history, social, surgical and family history all reviewed in electronic medical record.  No pertanent information unless stated regarding to the chief complaint.   Review of Systems: No headache, visual changes, nausea, vomiting, diarrhea, constipation, dizziness, abdominal pain, skin rash, fevers, chills, night sweats, weight loss, swollen lymph nodes, body aches, joint swelling, muscle aches, chest pain, shortness of breath, mood changes.    Objective  There were no vitals taken for this visit.   Systems examined below as of 02/17/17 General: NAD A&O x3 mood, affect normal  HEENT: Pupils equal, extraocular movements intact no nystagmus Respiratory: not short of breath at rest or with speaking Cardiovascular: No lower extremity edema, non tender Skin: Warm dry intact with no signs of infection or rash on extremities or on  axial skeleton. Abdomen: Soft nontender, no masses Neuro: Cranial nerves  intact, neurovascularly intact in all extremities with 2+ DTRs and 2+ pulses. Lymph: No lymphadenopathy appreciated today  Gait normal with good balance and coordination.  MSK: Non tender with full range of motion and good stability and symmetric strength and tone of shoulders, elbows, wrist,  knee hips and ankles bilaterally.     Back Exam:  Inspection: Unremarkable  Motion: Flexion 25 deg with Still mild worsening symptoms of the radicular symptoms  down the left leg no change from previous exam. Extension 15 deg, Side Bending to 25 deg bilaterally,  Rotation to 25 deg bilaterally  SLR laying: Negative today XSLR laying: still has some mild positive crossover test as well.  Palpable tenderness: Continue mild discomfort over the L5 region on the left side FABER: negative. Mild discomfort on the left side but improved Sensory change: Gross sensation intact to all lumbar and sacral dermatomes.  Reflexes: 2+ at both patellar tendons, 2+ at achilles tendons, Babinski's downgoing.  Strength at foot  Still mild weakness on the left side with dorsiflexion and plantarflexion on the left compared to the right side  Osteopathic findings T3 extended rotated and side bent right inhaled third rib T8 extended rotated and side bent left L3 flexed rotated and side bent right Sacrum right on right      Impression and Recommendations:     This case required medical decision making of moderate complexity.      Note: This dictation was prepared with Dragon dictation along with smaller phrase technology. Any transcriptional errors that result from this process are unintentional.

## 2017-02-18 ENCOUNTER — Ambulatory Visit: Payer: BLUE CROSS/BLUE SHIELD | Admitting: Family Medicine

## 2017-02-21 ENCOUNTER — Ambulatory Visit: Payer: Managed Care, Other (non HMO) | Admitting: Interventional Cardiology

## 2017-02-25 ENCOUNTER — Telehealth: Payer: Self-pay | Admitting: Interventional Cardiology

## 2017-02-25 NOTE — Telephone Encounter (Signed)
This does not sound cardiac currently. Keep us posted. Not sure what it is but may be related to reflux.

## 2017-02-25 NOTE — Telephone Encounter (Signed)
Pt is actually having the sensation right now. States this episode started around 2AM.  States sensation did go away for a short period of time this morning but returned.  Not currently in her chest, just at the base of her esophagus.   She propped herself up on pillows last night but this didn't help.  States episodes do last 12hrs or longer, then they will go away for 1-2 days.  Denies changes to diet other than decreasing the amount of sodium she takes in.  Last night right before burning sensation started, pt did belch.  States that nothing seems to bring the episodes on.  Denies any trouble swallowing.  Activity while present does not worsen the symptoms.  Will route information to Dr. Katrinka BlazingSmith.

## 2017-02-25 NOTE — Telephone Encounter (Signed)
How long does the chest discomfort last? Is a precipitated by activity? Does activity worsen the discomfort if it is already present?  Kayla FunkJohn Archer or Kayla Moloneyeville Gates would be my recommendations for PCP at [facility].

## 2017-02-25 NOTE — Telephone Encounter (Addendum)
Spoke with pt and made her aware of Dr. Michaelle CopasSmith's recommendations.  Pt verbalized understanding and was in agreement with this plan. Pt also concerned sx maybe anxiety related.  Pt inquired about possible low dose Valium.  Advised we don't normally prescribe these types of medications and she would need to contact a PCP about this.  Pt currently trying to get established with a PCP.  Will route to Dr. Katrinka BlazingSmith to update.

## 2017-02-25 NOTE — Telephone Encounter (Signed)
New Message  Pt voiced has something's to discuss prior to surgery.  Please f/u

## 2017-02-25 NOTE — Telephone Encounter (Signed)
Spoke with pt and she states for the last 10 days she has been having a burning sensation and slight tightness that feels like it begins at the base of her esophagus and goes into her chest.  She was adamant that it was not chest pain.  States she feels it could either be heartburn or angina.  Also feels possibly related to anxiety with all of her heart issues that are going on.  It comes and goes, not constant.  Pt would like a recommendation on heartburn medication, specifically mentioned Omeprazole?  Pt would also like a recommendation for a PCP.  Advised I would send message to Dr. Katrinka BlazingSmith for review and advisement.  Pt appreciative for assistance.

## 2017-02-25 NOTE — Telephone Encounter (Signed)
Left message to call back  

## 2017-03-01 ENCOUNTER — Telehealth: Payer: Self-pay | Admitting: *Deleted

## 2017-03-01 ENCOUNTER — Encounter: Payer: Self-pay | Admitting: Family Medicine

## 2017-03-01 ENCOUNTER — Encounter: Payer: Self-pay | Admitting: Interventional Cardiology

## 2017-03-01 MED ORDER — ALPRAZOLAM 0.25 MG PO TABS: 0.2500 mg | ORAL_TABLET | Freq: Every evening | ORAL | 0 refills | Status: DC | PRN

## 2017-03-01 NOTE — Telephone Encounter (Signed)
Spoke with pt and informed her of recommendations per Dr. Katrinka BlazingSmith.  Pt in agreement with this plan.  Advised I would call in prescription and if any trouble phoning in we would print script for her to pick up.  Pt verbalized understanding and was in agreement with this plan.  Spoke with Rosanne AshingJim at [facility] on [address] and phoned in prescription.

## 2017-03-01 NOTE — Telephone Encounter (Signed)
-----   Message from Lyn RecordsHenry W Smith, MD sent at 03/01/2017  1:14 PM EDT ----- Regarding: Anxiety relief Xanax .25 mg every 6-8 hours as needed for anxiety. No driving within 4 hours of a dose.

## 2017-03-04 NOTE — Telephone Encounter (Signed)
Called patient to offer 2:30pm appt. She is unable to take it and will stick with tomorrow's appointment instead.

## 2017-03-04 NOTE — Progress Notes (Signed)
[facility] D[facility] [address] [place], [place] [address] Phone: [phone] Subjective:    CC: low back pain f/u  GETTING AORTIC VALVE REPLACED BJY:NWGNFAOZHYHPI:Subjective  Kayla Archer is a 51 y.o. female coming in with complaint of low back pain. Patient states it seems to be more left-sided. Patient was found to have more of a lumbar radiculopathy. Patient was doing relatively well and last follow-up. Patient unfortunately did have to reschedule her appointment secondary to the weather. Patient states that she is starting to have radicular symptoms again and having more buttocks pain.Patient states that it seems to be unbearable. Having a significant amount pain then she will is concern that she will be able to participate in cardiac rehabilitation. Patient knows that this is very important. Patient is also complaining tennis and states that the pain is so severe that even allowing her to sleep.  Patient is scheduled to have aortic valve replacement in the near future.   patient did have x-rays of the lumbar spine 11/13/2016. This is independent living visualized by me. X-rays show no significant bony normality.  Past Medical History:  Diagnosis Date  . Cartilage tear    rt knee  . Heart murmur   . Medical history non-contributory    see valve rep-  . PONV (postoperative nausea and vomiting)    plus hypotension   Past Surgical History:  Procedure Laterality Date  . 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 KNEE ARTHROSCOPY Right 12/28/2013   Procedure: ARTHROSCOPY KNEE;  Surgeon: Dannielle HuhSteve Lucey, MD;  [facility];  Service: Orthopedics;  Laterality: Right;  . LABIOPLASTY Bilateral 10/04/2016   Procedure: LABIAL REDUCTION;  Surgeon: Marcelle OverlieMichelle Grewal, MD;  Location: [facility] ORS;  Service: Gynecology;  Laterality: Bilateral;  . TEE WITHOUT CARDIOVERSION N/A 02/08/2017   Procedure: TRANSESOPHAGEAL ECHOCARDIOGRAM (TEE);  Surgeon: Thurmon FairMihai Croitoru, MD;   [facility];  Service: Cardiovascular;  Laterality: N/A;   Social History   Social History  . Marital status: Married    Spouse name: N/A  . Number of children: N/A  . Years of education: N/A   Social History Main Topics  . Smoking status: Never Smoker  . Smokeless tobacco: Never Used  . Alcohol use 1.8 oz/week    3 Glasses of wine per week  . Drug use: No  . Sexual activity: Not Asked   Other Topics Concern  . None   Social History Narrative  . None   No Known Allergies Family History  Problem Relation Age of Onset  . Hypertension Mother   . Diabetes type II Mother   . Other Mother     NONHODGKINS LYMPHOMA  . Stroke Father   . Heart attack Father   . Other Sister     PRIMARY PULMONARY HTN  . Healthy Brother   . Hypertension Sister   . Diabetes type II Sister   . Hypertension Sister   . Diabetes type II Sister     Past medical history, social, surgical and family history all reviewed in electronic medical record.  No pertanent information unless stated regarding to the chief complaint.   Review of Systems: No headache, visual changes, nausea, vomiting, diarrhea, constipation, dizziness, abdominal pain, skin rash, fevers, chills, night sweats, weight loss, swollen lymph nodes, body aches, joint swelling, chest pain, mood changes.  Mild shortness of breath positive muscle aches Objective  Blood pressure 112/70, pulse 60, height 5\' 4"  (1.626 m), weight  138 lb (62.6 kg).   Systems examined below as of 03/05/17 General: NAD A&O x3 mood, affect normal  HEENT: Pupils equal, extraocular movements intact no nystagmus Respiratory: not short of breath at rest or with speaking Cardiovascular: No lower extremity edema, non tender Skin: Warm dry intact with no signs of infection or rash on extremities or on axial skeleton. Abdomen: Soft nontender, no masses Neuro: Cranial nerves  intact, neurovascularly intact in all extremities with 2+ DTRs and 2+  pulses. Lymph: No lymphadenopathy appreciated today  Gait normal with good balance and coordination.  MSK: Non tender with full range of motion and good stability and symmetric strength and tone of shoulders, elbows, wrist,  knee hips and ankles bilaterally.     Back Exam:  Inspection: Unremarkable  Motion: Flexion 25 deg with Still mild worsening symptoms of the radicular symptoms down the left leg severely worse than previous exam. Extension 15 deg, Side Bending to 25 deg bilaterally,  Rotation to 25 deg bilaterally  SLR laying: Positive left side XSLR laying: still has some mild positive crossover test as well.  Palpable tenderness: Severely worsening pain in the L5-S1 region bilaterally. FABER: Unable to do secondary to discomfort Sensory change: Gross sensation intact to all lumbar and sacral dermatomes.  Reflexes: 2+ at both patellar tendons, 2+ at achilles tendons, Babinski's downgoing.  Strength at foot  Weakness noted of 3 out of 5 strength of plantarflexion on the left foot compared to the contralateral side off side. Significant worse than previously.  Osteopathic findings Cervical C2 flexed rotated and side bent right C4 flexed rotated and side bent left C6 flexed rotated and side bent left T3 extended rotated and side bent right inhaled third rib T9 to lower back secondary to patient's pain.       Impression and Recommendations:     This case required medical decision making of moderate complexity.      Note: This dictation was prepared with Dragon dictation along with smaller phrase technology. Any transcriptional errors that result from this process are unintentional.

## 2017-03-05 ENCOUNTER — Ambulatory Visit (INDEPENDENT_AMBULATORY_CARE_PROVIDER_SITE_OTHER): Payer: BLUE CROSS/BLUE SHIELD | Admitting: Family Medicine

## 2017-03-05 ENCOUNTER — Encounter: Payer: Self-pay | Admitting: Family Medicine

## 2017-03-05 VITALS — BP 112/70 | HR 60 | Ht 64.0 in | Wt 138.0 lb

## 2017-03-05 DIAGNOSIS — M5416 Radiculopathy, lumbar region: Secondary | ICD-10-CM

## 2017-03-05 DIAGNOSIS — M545 Low back pain: Secondary | ICD-10-CM

## 2017-03-05 DIAGNOSIS — M999 Biomechanical lesion, unspecified: Secondary | ICD-10-CM | POA: Diagnosis not present

## 2017-03-05 MED ORDER — KETOROLAC TROMETHAMINE 60 MG/2ML IM SOLN
60.0000 mg | Freq: Once | INTRAMUSCULAR | Status: AC
  Administered 2017-03-05: 60 mg via INTRAMUSCULAR

## 2017-03-05 MED ORDER — METHYLPREDNISOLONE ACETATE 80 MG/ML IJ SUSP
80.0000 mg | Freq: Once | INTRAMUSCULAR | Status: AC
  Administered 2017-03-05: 80 mg via INTRAMUSCULAR

## 2017-03-05 NOTE — Assessment & Plan Note (Signed)
Decision today to treat with OMT was based on Physical Exam  After verbal consent patient was treated with HVLA, ME, FPR techniques in  thoracic,areas  Patient tolerated the procedure well with improvement in symptoms  Patient given exercises, stretches and lifestyle modifications  See medications in patient instructions if given  Patient will follow up in 3 weeks

## 2017-03-05 NOTE — Assessment & Plan Note (Signed)
Worsening symptoms with radicular symptoms that is constant as well as weakness. Discussed with patient at great length. Patient will start doing icing regimen, will be set up for an MRI because patient would be a candidate for an epidural. Patient is to get a aortic valve replacement in having this done's of the essence. Patient's has pain medications previously prescribed. Follow-up 2 weeks after epidural.

## 2017-03-05 NOTE — Patient Instructions (Signed)
Good to see you  I am here if you have questions.  We gave 2 injections today  We will get MRi and call them soon [phone] I will write you and talk to you about the next step after the MRI.  Likely an epidural.

## 2017-03-06 ENCOUNTER — Encounter: Payer: Self-pay | Admitting: Family Medicine

## 2017-03-13 ENCOUNTER — Other Ambulatory Visit: Payer: Self-pay | Admitting: Family Medicine

## 2017-03-13 DIAGNOSIS — I359 Nonrheumatic aortic valve disorder, unspecified: Secondary | ICD-10-CM | POA: Diagnosis not present

## 2017-03-13 DIAGNOSIS — R0789 Other chest pain: Secondary | ICD-10-CM | POA: Diagnosis not present

## 2017-03-13 DIAGNOSIS — Q211 Atrial septal defect: Secondary | ICD-10-CM | POA: Diagnosis not present

## 2017-03-13 DIAGNOSIS — R9431 Abnormal electrocardiogram [ECG] [EKG]: Secondary | ICD-10-CM | POA: Diagnosis not present

## 2017-03-13 DIAGNOSIS — Q231 Congenital insufficiency of aortic valve: Secondary | ICD-10-CM | POA: Diagnosis not present

## 2017-03-13 DIAGNOSIS — I517 Cardiomegaly: Secondary | ICD-10-CM | POA: Diagnosis not present

## 2017-03-13 DIAGNOSIS — R5383 Other fatigue: Secondary | ICD-10-CM | POA: Diagnosis not present

## 2017-03-13 DIAGNOSIS — Z01810 Encounter for preprocedural cardiovascular examination: Secondary | ICD-10-CM | POA: Diagnosis not present

## 2017-03-13 DIAGNOSIS — Z952 Presence of prosthetic heart valve: Secondary | ICD-10-CM | POA: Diagnosis not present

## 2017-03-13 DIAGNOSIS — I350 Nonrheumatic aortic (valve) stenosis: Secondary | ICD-10-CM | POA: Diagnosis not present

## 2017-03-13 DIAGNOSIS — Z953 Presence of xenogenic heart valve: Secondary | ICD-10-CM | POA: Diagnosis not present

## 2017-03-13 DIAGNOSIS — R001 Bradycardia, unspecified: Secondary | ICD-10-CM | POA: Diagnosis not present

## 2017-03-13 DIAGNOSIS — Q2112 Patent foramen ovale: Secondary | ICD-10-CM | POA: Insufficient documentation

## 2017-03-13 DIAGNOSIS — Z79899 Other long term (current) drug therapy: Secondary | ICD-10-CM | POA: Diagnosis not present

## 2017-03-14 DIAGNOSIS — I351 Nonrheumatic aortic (valve) insufficiency: Secondary | ICD-10-CM | POA: Diagnosis not present

## 2017-03-14 DIAGNOSIS — I359 Nonrheumatic aortic valve disorder, unspecified: Secondary | ICD-10-CM | POA: Diagnosis not present

## 2017-03-14 DIAGNOSIS — Q231 Congenital insufficiency of aortic valve: Secondary | ICD-10-CM | POA: Diagnosis not present

## 2017-03-14 DIAGNOSIS — Z952 Presence of prosthetic heart valve: Secondary | ICD-10-CM | POA: Diagnosis not present

## 2017-03-14 DIAGNOSIS — I350 Nonrheumatic aortic (valve) stenosis: Secondary | ICD-10-CM | POA: Diagnosis not present

## 2017-03-14 DIAGNOSIS — N6310 Unspecified lump in the right breast, unspecified quadrant: Secondary | ICD-10-CM | POA: Diagnosis not present

## 2017-03-14 DIAGNOSIS — Z01810 Encounter for preprocedural cardiovascular examination: Secondary | ICD-10-CM | POA: Diagnosis not present

## 2017-03-15 DIAGNOSIS — R001 Bradycardia, unspecified: Secondary | ICD-10-CM | POA: Diagnosis not present

## 2017-03-15 DIAGNOSIS — J939 Pneumothorax, unspecified: Secondary | ICD-10-CM | POA: Diagnosis not present

## 2017-03-15 DIAGNOSIS — T8209XA Other mechanical complication of heart valve prosthesis, initial encounter: Secondary | ICD-10-CM | POA: Diagnosis not present

## 2017-03-15 DIAGNOSIS — M545 Low back pain: Secondary | ICD-10-CM | POA: Diagnosis not present

## 2017-03-15 DIAGNOSIS — I350 Nonrheumatic aortic (valve) stenosis: Secondary | ICD-10-CM | POA: Diagnosis not present

## 2017-03-15 DIAGNOSIS — Z9189 Other specified personal risk factors, not elsewhere classified: Secondary | ICD-10-CM | POA: Diagnosis not present

## 2017-03-15 DIAGNOSIS — I9789 Other postprocedural complications and disorders of the circulatory system, not elsewhere classified: Secondary | ICD-10-CM | POA: Diagnosis not present

## 2017-03-15 DIAGNOSIS — I442 Atrioventricular block, complete: Secondary | ICD-10-CM | POA: Diagnosis not present

## 2017-03-15 DIAGNOSIS — G8918 Other acute postprocedural pain: Secondary | ICD-10-CM | POA: Diagnosis not present

## 2017-03-15 DIAGNOSIS — E8770 Fluid overload, unspecified: Secondary | ICD-10-CM | POA: Diagnosis not present

## 2017-03-15 DIAGNOSIS — T82897A Other specified complication of cardiac prosthetic devices, implants and grafts, initial encounter: Secondary | ICD-10-CM | POA: Diagnosis not present

## 2017-03-15 DIAGNOSIS — M999 Biomechanical lesion, unspecified: Secondary | ICD-10-CM | POA: Diagnosis not present

## 2017-03-15 DIAGNOSIS — R9431 Abnormal electrocardiogram [ECG] [EKG]: Secondary | ICD-10-CM | POA: Diagnosis not present

## 2017-03-15 DIAGNOSIS — Z952 Presence of prosthetic heart valve: Secondary | ICD-10-CM | POA: Diagnosis not present

## 2017-03-15 DIAGNOSIS — I447 Left bundle-branch block, unspecified: Secondary | ICD-10-CM | POA: Diagnosis not present

## 2017-03-15 DIAGNOSIS — Q211 Atrial septal defect: Secondary | ICD-10-CM | POA: Diagnosis not present

## 2017-03-15 DIAGNOSIS — Z452 Encounter for adjustment and management of vascular access device: Secondary | ICD-10-CM | POA: Diagnosis not present

## 2017-03-15 DIAGNOSIS — N6310 Unspecified lump in the right breast, unspecified quadrant: Secondary | ICD-10-CM | POA: Diagnosis not present

## 2017-03-15 DIAGNOSIS — R918 Other nonspecific abnormal finding of lung field: Secondary | ICD-10-CM | POA: Diagnosis not present

## 2017-03-15 DIAGNOSIS — T8201XA Breakdown (mechanical) of heart valve prosthesis, initial encounter: Secondary | ICD-10-CM | POA: Diagnosis not present

## 2017-03-15 DIAGNOSIS — J9811 Atelectasis: Secondary | ICD-10-CM | POA: Diagnosis not present

## 2017-03-15 DIAGNOSIS — Z950 Presence of cardiac pacemaker: Secondary | ICD-10-CM | POA: Diagnosis not present

## 2017-03-15 DIAGNOSIS — M5416 Radiculopathy, lumbar region: Secondary | ICD-10-CM | POA: Diagnosis not present

## 2017-03-15 DIAGNOSIS — Z8679 Personal history of other diseases of the circulatory system: Secondary | ICD-10-CM | POA: Diagnosis not present

## 2017-03-15 DIAGNOSIS — I2510 Atherosclerotic heart disease of native coronary artery without angina pectoris: Secondary | ICD-10-CM | POA: Diagnosis not present

## 2017-03-15 DIAGNOSIS — M549 Dorsalgia, unspecified: Secondary | ICD-10-CM | POA: Diagnosis not present

## 2017-03-15 DIAGNOSIS — Z9889 Other specified postprocedural states: Secondary | ICD-10-CM | POA: Diagnosis not present

## 2017-03-15 DIAGNOSIS — I495 Sick sinus syndrome: Secondary | ICD-10-CM | POA: Diagnosis not present

## 2017-03-15 DIAGNOSIS — I4892 Unspecified atrial flutter: Secondary | ICD-10-CM | POA: Diagnosis not present

## 2017-03-15 DIAGNOSIS — Z0189 Encounter for other specified special examinations: Secondary | ICD-10-CM | POA: Diagnosis not present

## 2017-03-15 DIAGNOSIS — I351 Nonrheumatic aortic (valve) insufficiency: Secondary | ICD-10-CM | POA: Diagnosis not present

## 2017-03-15 DIAGNOSIS — Z953 Presence of xenogenic heart valve: Secondary | ICD-10-CM | POA: Diagnosis not present

## 2017-03-15 DIAGNOSIS — R739 Hyperglycemia, unspecified: Secondary | ICD-10-CM | POA: Diagnosis not present

## 2017-03-15 DIAGNOSIS — I352 Nonrheumatic aortic (valve) stenosis with insufficiency: Secondary | ICD-10-CM | POA: Diagnosis not present

## 2017-03-15 DIAGNOSIS — J90 Pleural effusion, not elsewhere classified: Secondary | ICD-10-CM | POA: Diagnosis not present

## 2017-03-15 DIAGNOSIS — R748 Abnormal levels of other serum enzymes: Secondary | ICD-10-CM | POA: Diagnosis not present

## 2017-03-15 DIAGNOSIS — I441 Atrioventricular block, second degree: Secondary | ICD-10-CM | POA: Diagnosis not present

## 2017-03-19 ENCOUNTER — Ambulatory Visit
Admission: RE | Admit: 2017-03-19 | Discharge: 2017-03-19 | Disposition: A | Discharge status: Home / Self Care | Payer: BLUE CROSS/BLUE SHIELD | Source: Ambulatory Visit | Attending: Family Medicine | Admitting: Family Medicine

## 2017-03-19 DIAGNOSIS — Z789 Other specified health status: Secondary | ICD-10-CM

## 2017-03-19 DIAGNOSIS — M5416 Radiculopathy, lumbar region: Secondary | ICD-10-CM

## 2017-03-20 DIAGNOSIS — M545 Low back pain: Secondary | ICD-10-CM | POA: Diagnosis not present

## 2017-04-04 DIAGNOSIS — Z0189 Encounter for other specified special examinations: Secondary | ICD-10-CM | POA: Diagnosis not present

## 2017-04-04 DIAGNOSIS — Z9889 Other specified postprocedural states: Secondary | ICD-10-CM | POA: Diagnosis not present

## 2017-04-04 DIAGNOSIS — Z8679 Personal history of other diseases of the circulatory system: Secondary | ICD-10-CM | POA: Diagnosis not present

## 2017-04-04 DIAGNOSIS — I350 Nonrheumatic aortic (valve) stenosis: Secondary | ICD-10-CM | POA: Diagnosis not present

## 2017-04-08 ENCOUNTER — Encounter: Payer: Self-pay | Admitting: Family Medicine

## 2017-04-08 ENCOUNTER — Other Ambulatory Visit: Payer: Self-pay

## 2017-04-08 DIAGNOSIS — M5416 Radiculopathy, lumbar region: Secondary | ICD-10-CM

## 2017-04-12 ENCOUNTER — Inpatient Hospital Stay
Admission: RE | Admit: 2017-04-12 | Discharge: 2017-04-12 | Disposition: A | Discharge status: Home / Self Care | Payer: BLUE CROSS/BLUE SHIELD | Source: Ambulatory Visit | Attending: Family Medicine | Admitting: Family Medicine

## 2017-04-12 NOTE — Discharge Instructions (Signed)

## 2017-04-16 ENCOUNTER — Telehealth: Payer: Self-pay | Admitting: General Practice

## 2017-04-16 NOTE — Telephone Encounter (Signed)
Pt is having open heart surgery and they surgeon told her not epidural.  She does not want to go into surgery with her back hurting.  She is wanting to know if Dr Katrinka BlazingSmith can give her an injection just has a Band-Aid to make it though the surgery

## 2017-04-17 NOTE — Telephone Encounter (Signed)
Spoke with patient. Scheduled appointment tomorrow for cocktail injection.

## 2017-04-18 ENCOUNTER — Encounter: Payer: Self-pay | Admitting: Family Medicine

## 2017-04-18 ENCOUNTER — Ambulatory Visit (INDEPENDENT_AMBULATORY_CARE_PROVIDER_SITE_OTHER): Payer: BLUE CROSS/BLUE SHIELD | Admitting: Family Medicine

## 2017-04-18 VITALS — BP 118/62 | HR 78 | Resp 16 | Wt 134.0 lb

## 2017-04-18 DIAGNOSIS — M5416 Radiculopathy, lumbar region: Secondary | ICD-10-CM

## 2017-04-18 DIAGNOSIS — M999 Biomechanical lesion, unspecified: Secondary | ICD-10-CM

## 2017-04-18 DIAGNOSIS — M549 Dorsalgia, unspecified: Secondary | ICD-10-CM

## 2017-04-18 MED ORDER — KETOROLAC TROMETHAMINE 60 MG/2ML IM SOLN
60.0000 mg | Freq: Once | INTRAMUSCULAR | Status: AC
  Administered 2017-04-18: 60 mg via INTRAMUSCULAR

## 2017-04-18 MED ORDER — METHYLPREDNISOLONE ACETATE 80 MG/ML IJ SUSP
80.0000 mg | Freq: Once | INTRAMUSCULAR | Status: AC
  Administered 2017-04-18: 80 mg via INTRAMUSCULAR

## 2017-04-18 NOTE — Patient Instructions (Signed)
Good to see you  I will be thinking of you  I am here if you need me at all.  You will do great!!!

## 2017-04-18 NOTE — Assessment & Plan Note (Signed)
Decision today to treat with OMT was based on Physical Exam  After verbal consent patient was treated with HVLA, ME, FPR techniques in cervical, thoracic, lumbar and sacral areas  Patient tolerated the procedure well with improvement in symptoms  Patient given exercises, stretches and lifestyle modifications  See medications in patient instructions if given  Patient will follow up after surgery

## 2017-04-18 NOTE — Assessment & Plan Note (Signed)
Still having radicular symptoms.patient unable to have epidural at this time, Toradol and Depo-Medrol given today responded to manipulation, follow-up after surgery.

## 2017-04-18 NOTE — Progress Notes (Signed)
Tawana ScaleZach Colby Reels D.O. [facility] [address] [place], [place] [address] Phone: [phone] Subjective:    CC: low back pain f/u  GETTING AORTIC VALVE REPLACED BJY:NWGNFAOZHYHPI:Subjective  Kayla Archer is a 51 y.o. female coming in with complaint of low back pain. Patient states it seems to be more left-sided. Patient was found to have more of a lumbar radiculopathy. Worsening radicular symptoms.  Patient is scheduled to have aortic valve replacement in the near future. Next week. Can't get epidural.  Having pain and concern with traveling.  Does not want the back pain to get in the way     patient did have x-rays of the lumbar spine 11/13/2016. This is independent living visualized by me. X-rays show no significant bony normality.  Past Medical History:  Diagnosis Date  . Cartilage tear    rt knee  . Heart murmur   . Medical history non-contributory    see valve rep-  . PONV (postoperative nausea and vomiting)    plus hypotension   Past Surgical History:  Procedure Laterality Date  . 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 KNEE ARTHROSCOPY Right 12/28/2013   Procedure: ARTHROSCOPY KNEE;  Surgeon: Dannielle HuhSteve Lucey, MD;  [facility];  Service: Orthopedics;  Laterality: Right;  . LABIOPLASTY Bilateral 10/04/2016   Procedure: LABIAL REDUCTION;  Surgeon: Marcelle OverlieMichelle Grewal, MD;  Location: [facility] ORS;  Service: Gynecology;  Laterality: Bilateral;  . TEE WITHOUT CARDIOVERSION N/A 02/08/2017   Procedure: TRANSESOPHAGEAL ECHOCARDIOGRAM (TEE);  Surgeon: Thurmon FairMihai Croitoru, MD;  [facility];  Service: Cardiovascular;  Laterality: N/A;   Social History   Social History  . Marital status: Married    Spouse name: N/A  . Number of children: N/A  . Years of education: N/A   Social History Main Topics  . Smoking status: Never Smoker  . Smokeless tobacco: Never Used  . Alcohol use 1.8 oz/week    3 Glasses of wine per week  . Drug use: No  . Sexual  activity: Not Asked   Other Topics Concern  . None   Social History Narrative  . None   No Known Allergies Family History  Problem Relation Age of Onset  . Hypertension Mother   . Diabetes type II Mother   . Other Mother        NONHODGKINS LYMPHOMA  . Stroke Father   . Heart attack Father   . Other Sister        PRIMARY PULMONARY HTN  . Healthy Brother   . Hypertension Sister   . Diabetes type II Sister   . Hypertension Sister   . Diabetes type II Sister     Past medical history, social, surgical and family history all reviewed in electronic medical record.  No pertanent information unless stated regarding to the chief complaint.   Review of Systems: No headache, visual changes, nausea, vomiting, diarrhea, constipation, dizziness, abdominal pain, skin rash, fevers, chills, night sweats, weight loss, swollen lymph nodes, body aches, joint swelling, chest pain, mood changes.  Mild shortness of breath positive muscle aches Objective  Blood pressure 118/62, pulse 78, resp. rate 16, weight 134 lb (60.8 kg), SpO2 98 %.   Systems examined below as of 04/18/17 General: NAD A&O x3 mood, affect normal  HEENT: Pupils equal, extraocular movements intact no nystagmus Respiratory: not short of breath at rest or with speaking Cardiovascular: No lower extremity edema, non tender Skin: Warm dry intact with no signs of  infection or rash on extremities or on axial skeleton. Abdomen: Soft nontender, no masses Neuro: Cranial nerves  intact, neurovascularly intact in all extremities with 2+ DTRs and 2+ pulses. Lymph: No lymphadenopathy appreciated today  Gait normal with good balance and coordination.  MSK: Non tender with full range of motion and good stability and symmetric strength and tone of shoulders, elbows, wrist,  knee hips and ankles bilaterally.      Back Exam:  Inspection: Unremarkable  Motion: Flexion 45 deg, Extension 25 deg, Side Bending to 25 deg bilaterally,  Rotation to 25  deg bilaterally  SLR laying: positive left XSLR laying: Negative  Palpable tenderness: tender over left SI joint. FABER: negative. Sensory change: Gross sensation intact to all lumbar and sacral dermatomes.  Reflexes: 2+ at both patellar tendons, 2+ at achilles tendons, Babinski's downgoing.  Strength at foot  Plantar-flexion: 5/5 Dorsi-flexion: 5/5 Eversion: 5/5 Inversion: 5/5  Leg strength  Quad: 5/5 Hamstring: 5/5 Hip flexor: 5/5 Hip abductors: 5/5  Gait unremarkable.   Osteopathic findings Cervical C2 flexed rotated and side bent right C4 flexed rotated and side bent left C6 flexed rotated and side bent left T3 extended rotated and side bent right inhaled third rib T9 extended rotated and side bent left L2 flexed rotated and side bent right Sacrum left on left        Impression and Recommendations:     This case required medical decision making of moderate complexity.      Note: This dictation was prepared with Dragon dictation along with smaller phrase technology. Any transcriptional errors that result from this process are unintentional.

## 2017-04-24 DIAGNOSIS — Q211 Atrial septal defect: Secondary | ICD-10-CM | POA: Diagnosis not present

## 2017-04-24 DIAGNOSIS — T8209XA Other mechanical complication of heart valve prosthesis, initial encounter: Secondary | ICD-10-CM | POA: Diagnosis not present

## 2017-04-24 DIAGNOSIS — I350 Nonrheumatic aortic (valve) stenosis: Secondary | ICD-10-CM | POA: Diagnosis not present

## 2017-04-24 DIAGNOSIS — T8201XA Breakdown (mechanical) of heart valve prosthesis, initial encounter: Secondary | ICD-10-CM | POA: Diagnosis not present

## 2017-04-24 DIAGNOSIS — I442 Atrioventricular block, complete: Secondary | ICD-10-CM | POA: Insufficient documentation

## 2017-04-24 DIAGNOSIS — J9811 Atelectasis: Secondary | ICD-10-CM | POA: Diagnosis not present

## 2017-04-24 DIAGNOSIS — R918 Other nonspecific abnormal finding of lung field: Secondary | ICD-10-CM | POA: Diagnosis not present

## 2017-04-24 DIAGNOSIS — R9431 Abnormal electrocardiogram [ECG] [EKG]: Secondary | ICD-10-CM | POA: Diagnosis not present

## 2017-04-24 DIAGNOSIS — R739 Hyperglycemia, unspecified: Secondary | ICD-10-CM | POA: Diagnosis not present

## 2017-04-24 DIAGNOSIS — G8918 Other acute postprocedural pain: Secondary | ICD-10-CM | POA: Diagnosis not present

## 2017-04-24 HISTORY — DX: Atrioventricular block, complete: I44.2

## 2017-04-25 DIAGNOSIS — Z452 Encounter for adjustment and management of vascular access device: Secondary | ICD-10-CM | POA: Diagnosis not present

## 2017-04-25 DIAGNOSIS — G8918 Other acute postprocedural pain: Secondary | ICD-10-CM | POA: Diagnosis not present

## 2017-04-25 DIAGNOSIS — R739 Hyperglycemia, unspecified: Secondary | ICD-10-CM | POA: Diagnosis not present

## 2017-04-25 DIAGNOSIS — I442 Atrioventricular block, complete: Secondary | ICD-10-CM | POA: Diagnosis not present

## 2017-04-25 DIAGNOSIS — R9431 Abnormal electrocardiogram [ECG] [EKG]: Secondary | ICD-10-CM | POA: Diagnosis not present

## 2017-04-25 DIAGNOSIS — Z952 Presence of prosthetic heart valve: Secondary | ICD-10-CM | POA: Diagnosis not present

## 2017-04-25 DIAGNOSIS — J9811 Atelectasis: Secondary | ICD-10-CM | POA: Diagnosis not present

## 2017-04-25 DIAGNOSIS — R918 Other nonspecific abnormal finding of lung field: Secondary | ICD-10-CM | POA: Diagnosis not present

## 2017-04-26 DIAGNOSIS — R9431 Abnormal electrocardiogram [ECG] [EKG]: Secondary | ICD-10-CM | POA: Diagnosis not present

## 2017-04-26 DIAGNOSIS — I447 Left bundle-branch block, unspecified: Secondary | ICD-10-CM | POA: Diagnosis not present

## 2017-04-26 DIAGNOSIS — IMO0001 Reserved for inherently not codable concepts without codable children: Secondary | ICD-10-CM | POA: Insufficient documentation

## 2017-04-26 DIAGNOSIS — J939 Pneumothorax, unspecified: Secondary | ICD-10-CM | POA: Diagnosis not present

## 2017-04-26 DIAGNOSIS — I442 Atrioventricular block, complete: Secondary | ICD-10-CM | POA: Diagnosis not present

## 2017-04-27 DIAGNOSIS — I442 Atrioventricular block, complete: Secondary | ICD-10-CM | POA: Diagnosis not present

## 2017-04-27 DIAGNOSIS — I447 Left bundle-branch block, unspecified: Secondary | ICD-10-CM | POA: Diagnosis not present

## 2017-04-27 DIAGNOSIS — R9431 Abnormal electrocardiogram [ECG] [EKG]: Secondary | ICD-10-CM | POA: Diagnosis not present

## 2017-04-28 DIAGNOSIS — R9431 Abnormal electrocardiogram [ECG] [EKG]: Secondary | ICD-10-CM | POA: Diagnosis not present

## 2017-04-28 DIAGNOSIS — I447 Left bundle-branch block, unspecified: Secondary | ICD-10-CM | POA: Diagnosis not present

## 2017-04-28 DIAGNOSIS — R918 Other nonspecific abnormal finding of lung field: Secondary | ICD-10-CM | POA: Diagnosis not present

## 2017-04-28 DIAGNOSIS — Z953 Presence of xenogenic heart valve: Secondary | ICD-10-CM | POA: Diagnosis not present

## 2017-04-28 DIAGNOSIS — J939 Pneumothorax, unspecified: Secondary | ICD-10-CM | POA: Diagnosis not present

## 2017-04-29 DIAGNOSIS — Z9189 Other specified personal risk factors, not elsewhere classified: Secondary | ICD-10-CM | POA: Diagnosis not present

## 2017-04-29 DIAGNOSIS — Z952 Presence of prosthetic heart valve: Secondary | ICD-10-CM | POA: Diagnosis not present

## 2017-04-29 DIAGNOSIS — I442 Atrioventricular block, complete: Secondary | ICD-10-CM | POA: Diagnosis not present

## 2017-04-29 DIAGNOSIS — I350 Nonrheumatic aortic (valve) stenosis: Secondary | ICD-10-CM | POA: Diagnosis not present

## 2017-04-29 DIAGNOSIS — R9431 Abnormal electrocardiogram [ECG] [EKG]: Secondary | ICD-10-CM | POA: Diagnosis not present

## 2017-04-29 DIAGNOSIS — Z9889 Other specified postprocedural states: Secondary | ICD-10-CM | POA: Diagnosis not present

## 2017-04-29 DIAGNOSIS — I447 Left bundle-branch block, unspecified: Secondary | ICD-10-CM | POA: Diagnosis not present

## 2017-04-30 DIAGNOSIS — J9811 Atelectasis: Secondary | ICD-10-CM | POA: Diagnosis not present

## 2017-04-30 DIAGNOSIS — I441 Atrioventricular block, second degree: Secondary | ICD-10-CM | POA: Diagnosis not present

## 2017-04-30 DIAGNOSIS — I495 Sick sinus syndrome: Secondary | ICD-10-CM | POA: Diagnosis not present

## 2017-04-30 DIAGNOSIS — J90 Pleural effusion, not elsewhere classified: Secondary | ICD-10-CM | POA: Diagnosis not present

## 2017-04-30 DIAGNOSIS — R9431 Abnormal electrocardiogram [ECG] [EKG]: Secondary | ICD-10-CM | POA: Diagnosis not present

## 2017-05-01 DIAGNOSIS — I442 Atrioventricular block, complete: Secondary | ICD-10-CM | POA: Diagnosis not present

## 2017-05-01 DIAGNOSIS — Z950 Presence of cardiac pacemaker: Secondary | ICD-10-CM | POA: Diagnosis not present

## 2017-05-01 DIAGNOSIS — R9431 Abnormal electrocardiogram [ECG] [EKG]: Secondary | ICD-10-CM | POA: Diagnosis not present

## 2017-05-02 DIAGNOSIS — R9431 Abnormal electrocardiogram [ECG] [EKG]: Secondary | ICD-10-CM | POA: Diagnosis not present

## 2017-05-02 DIAGNOSIS — I4892 Unspecified atrial flutter: Secondary | ICD-10-CM | POA: Insufficient documentation

## 2017-05-02 DIAGNOSIS — I442 Atrioventricular block, complete: Secondary | ICD-10-CM | POA: Diagnosis not present

## 2017-05-02 DIAGNOSIS — Z950 Presence of cardiac pacemaker: Secondary | ICD-10-CM | POA: Diagnosis not present

## 2017-05-07 DIAGNOSIS — Z8679 Personal history of other diseases of the circulatory system: Secondary | ICD-10-CM | POA: Diagnosis not present

## 2017-05-07 DIAGNOSIS — Z9889 Other specified postprocedural states: Secondary | ICD-10-CM | POA: Diagnosis not present

## 2017-05-07 DIAGNOSIS — Z0189 Encounter for other specified special examinations: Secondary | ICD-10-CM | POA: Diagnosis not present

## 2017-05-07 DIAGNOSIS — Z954 Presence of other heart-valve replacement: Secondary | ICD-10-CM | POA: Diagnosis not present

## 2017-05-21 DIAGNOSIS — Z954 Presence of other heart-valve replacement: Secondary | ICD-10-CM | POA: Diagnosis not present

## 2017-05-23 ENCOUNTER — Encounter: Payer: Self-pay | Admitting: Interventional Cardiology

## 2017-05-24 ENCOUNTER — Ambulatory Visit: Payer: BLUE CROSS/BLUE SHIELD | Admitting: Interventional Cardiology

## 2017-06-03 ENCOUNTER — Telehealth (HOSPITAL_COMMUNITY): Payer: Self-pay

## 2017-06-03 ENCOUNTER — Encounter (HOSPITAL_COMMUNITY): Payer: Self-pay

## 2017-06-03 DIAGNOSIS — Z954 Presence of other heart-valve replacement: Secondary | ICD-10-CM | POA: Diagnosis not present

## 2017-06-03 DIAGNOSIS — Z8679 Personal history of other diseases of the circulatory system: Secondary | ICD-10-CM | POA: Diagnosis not present

## 2017-06-03 DIAGNOSIS — I480 Paroxysmal atrial fibrillation: Secondary | ICD-10-CM | POA: Diagnosis not present

## 2017-06-03 NOTE — Telephone Encounter (Signed)
Patient insurance is active and benefits verified. Patient has [place] deductible $3500/$3500 has been met, out of pocket $6350/$6350 has been met, no co-insurance, no pre-authorization and no limit on visit. Passport/reference [phone].  Physician order signed and received.

## 2017-06-03 NOTE — Progress Notes (Signed)
I tried to call patient about scheduling for cardiac rehab. I was unable to leave a message on patient voicemail. Patient voicemail full. I have mailed patient a letter with information about cardiac rehab. °

## 2017-06-03 NOTE — Telephone Encounter (Signed)
I tried to call patient about scheduling for cardiac rehab. I was unable to leave a message on patient voicemail. Patient voicemail full. I have mailed patient a letter with information about cardiac rehab.

## 2017-06-04 DIAGNOSIS — F4542 Pain disorder with related psychological factors: Secondary | ICD-10-CM | POA: Diagnosis not present

## 2017-06-10 ENCOUNTER — Telehealth (HOSPITAL_COMMUNITY): Payer: Self-pay | Admitting: Pharmacist

## 2017-06-10 DIAGNOSIS — F4542 Pain disorder with related psychological factors: Secondary | ICD-10-CM | POA: Diagnosis not present

## 2017-06-13 ENCOUNTER — Encounter (HOSPITAL_COMMUNITY)
Admission: RE | Admit: 2017-06-13 | Discharge: 2017-06-13 | Disposition: A | Discharge status: Home / Self Care | Payer: BLUE CROSS/BLUE SHIELD | Source: Ambulatory Visit | Attending: Cardiology | Admitting: Cardiology

## 2017-06-13 ENCOUNTER — Encounter (HOSPITAL_COMMUNITY): Payer: Self-pay

## 2017-06-13 VITALS — BP 101/65 | HR 80 | Ht 63.0 in | Wt 132.3 lb

## 2017-06-13 DIAGNOSIS — Z9889 Other specified postprocedural states: Secondary | ICD-10-CM

## 2017-06-13 DIAGNOSIS — Z952 Presence of prosthetic heart valve: Secondary | ICD-10-CM | POA: Insufficient documentation

## 2017-06-13 NOTE — Progress Notes (Signed)
Cardiac Individual Treatment Plan  Patient Details  Name: Kayla Archer MRN: [id number] Date of Birth: 23-May-1966 Referring Provider:     CARDIAC REHAB PHASE II ORIENTATION from 06/13/2017 in [facility] CARDIAC REHAB  Referring Provider  Delrae Rend MD      Initial Encounter Date:    CARDIAC REHAB PHASE II ORIENTATION from 06/13/2017 in [facility] CARDIAC REHAB  Date  06/13/17  Referring Provider  Delrae Rend MD      Visit Diagnosis: S/P heart valve repair  Patient's Home Medications on Admission:  Current Outpatient Prescriptions:  .  ALPRAZolam (XANAX) 0.25 MG tablet, Take 1 tablet (0.25 mg total) by mouth at bedtime as needed for anxiety., Disp: 30 tablet, Rfl: 0 .  aspirin EC 81 MG tablet, Take 81 mg by mouth 2 (two) times daily., Disp: , Rfl:  .  ibuprofen (MOTRIN IB) 200 MG tablet, Take 600 mg by mouth every 6 (six) hours as needed for moderate pain., Disp: , Rfl:  .  metoprolol tartrate (LOPRESSOR) 25 MG tablet, Take 25 mg by mouth., Disp: , Rfl:   Past Medical History: Past Medical History:  Diagnosis Date  . Cartilage tear    rt knee  . Heart murmur   . Medical history non-contributory    see valve rep-  . PONV (postoperative nausea and vomiting)    plus hypotension    Tobacco Use: History  Smoking Status  . Never Smoker  Smokeless Tobacco  . Never Used    Labs: Recent Review Flowsheet Data    There is no flowsheet data to display.      Capillary Blood Glucose: No results found for: GLUCAP   Exercise Target Goals: Date: 06/13/17  Exercise Program Goal: Individual exercise prescription set with THRR, safety & activity barriers. Participant demonstrates ability to understand and report RPE using BORG scale, to self-measure pulse accurately, and to acknowledge the importance of the exercise prescription.  Exercise Prescription Goal: Starting with aerobic activity 30 plus minutes a day, 3 days per week  for initial exercise prescription. Provide home exercise prescription and guidelines that participant acknowledges understanding prior to discharge.  Activity Barriers & Risk Stratification:     Activity Barriers & Cardiac Risk Stratification - 06/13/17 1455      Activity Barriers & Cardiac Risk Stratification   Activity Barriers None   Cardiac Risk Stratification Moderate      6 Minute Walk:     6 Minute Walk    Row Name 06/13/17 1632         6 Minute Walk   Phase Initial     Distance 1220 feet     Walk Time 6 minutes     # of Rest Breaks 0     MPH 2.3     METS 4     RPE 12     VO2 Peak 14     Symptoms No     Resting HR 80 bpm     Resting BP 101/65     Max Ex. HR 125 bpm     Max Ex. BP 119/79     2 Minute Post BP 110/70        Oxygen Initial Assessment:   Oxygen Re-Evaluation:   Oxygen Discharge (Final Oxygen Re-Evaluation):   Initial Exercise Prescription:     Initial Exercise Prescription - 06/13/17 1600      Date of Initial Exercise RX and Referring Provider   Date 06/13/17   Referring  Provider Delrae Rend MD     Treadmill   MPH 2.5   Grade 2   Minutes 10   METs 3     Bike   Level 0.7   Minutes 10   METs 2.8     NuStep   Level 2   SPM 85   Minutes 10   METs 2.7     Prescription Details   Frequency (times per week) 3   Duration Progress to 45 minutes of aerobic exercise without signs/symptoms of physical distress     Intensity   THRR 40-80% of Max Heartrate 67-134   Ratings of Perceived Exertion 11-13   Perceived Dyspnea 0-4     Progression   Progression Continue to progress workloads to maintain intensity without signs/symptoms of physical distress.     Resistance Training   Training Prescription Yes   Weight 2   Reps 10-15      Perform Capillary Blood Glucose checks as needed.  Exercise Prescription Changes:   Exercise Comments:   Exercise Goals and Review:     Exercise Goals    Row Name 06/13/17 1456              Exercise Goals   Increase Physical Activity Yes       Intervention Develop an individualized exercise prescription for aerobic and resistive training based on initial evaluation findings, risk stratification, comorbidities and participant's personal goals.;Provide advice, education, support and counseling about physical activity/exercise needs.       Expected Outcomes Achievement of increased cardiorespiratory fitness and enhanced flexibility, muscular endurance and strength shown through measurements of functional capacity and personal statement of participant.       Increase Strength and Stamina Yes       Intervention Provide advice, education, support and counseling about physical activity/exercise needs.;Develop an individualized exercise prescription for aerobic and resistive training based on initial evaluation findings, risk stratification, comorbidities and participant's personal goals.       Expected Outcomes Achievement of increased cardiorespiratory fitness and enhanced flexibility, muscular endurance and strength shown through measurements of functional capacity and personal statement of participant.          Exercise Goals Re-Evaluation :    Discharge Exercise Prescription (Final Exercise Prescription Changes):   Nutrition:  Target Goals: Understanding of nutrition guidelines, daily intake of sodium 1500mg , cholesterol 200mg , calories 30% from fat and 7% or less from saturated fats, daily to have 5 or more servings of fruits and vegetables.  Biometrics:     Pre Biometrics - 06/13/17 1639      Pre Biometrics   Waist Circumference 32.5 inches   Hip Circumference 38 inches   Waist to Hip Ratio 0.86 %   Triceps Skinfold 36 mm   % Body Fat 36.3 %   Grip Strength 31 kg   Flexibility 15.5 in   Single Leg Stand 15.4 seconds       Nutrition Therapy Plan and Nutrition Goals:   Nutrition Discharge: Nutrition Scores:   Nutrition Goals  Re-Evaluation:   Nutrition Goals Re-Evaluation:   Nutrition Goals Discharge (Final Nutrition Goals Re-Evaluation):   Psychosocial: Target Goals: Acknowledge presence or absence of significant depression and/or stress, maximize coping skills, provide positive support system. Participant is able to verbalize types and ability to use techniques and skills needed for reducing stress and depression.  Initial Review & Psychosocial Screening:     Initial Psych Review & Screening - 06/13/17 1705      Initial Review  Current issues with Current Stress Concerns;Current Anxiety/Panic   Source of Stress Concerns Chronic Illness;Unable to participate in former interests or hobbies     Family Dynamics   Good Support System? Yes     Barriers   Psychosocial barriers to participate in program The patient should benefit from training in stress management and relaxation.     Screening Interventions   Interventions To provide support and resources with identified psychosocial needs  pt sees a counselor      Quality of Life Scores:     Quality of Life - 06/13/17 1640      Quality of Life Scores   Health/Function Pre (P)  13.29 %   Socioeconomic Pre (P)  26.79 %   Psych/Spiritual Pre (P)  17.42 %   Family Pre (P)  27.6 %      PHQ-9: Recent Review Flowsheet Data    There is no flowsheet data to display.     Interpretation of Total Score  Total Score Depression Severity:  1-4 = Minimal depression, 5-9 = Mild depression, 10-14 = Moderate depression, 15-19 = Moderately severe depression, 20-27 = Severe depression   Psychosocial Evaluation and Intervention:   Psychosocial Re-Evaluation:   Psychosocial Discharge (Final Psychosocial Re-Evaluation):   Vocational Rehabilitation: Provide vocational rehab assistance to qualifying candidates.   Vocational Rehab Evaluation & Intervention:   Education: Education Goals: Education classes will be provided on a weekly basis, covering  required topics. Participant will state understanding/return demonstration of topics presented.  Learning Barriers/Preferences:     Learning Barriers/Preferences - 06/13/17 1455      Learning Barriers/Preferences   Learning Barriers None   Learning Preferences Verbal Instruction;Skilled Demonstration;Audio      Education Topics: Count Your Pulse:  -Group instruction provided by verbal instruction, demonstration, patient participation and written materials to support subject.  Instructors address importance of being able to find your pulse and how to count your pulse when at home without a heart monitor.  Patients get hands on experience counting their pulse with staff help and individually.   Heart Attack, Angina, and Risk Factor Modification:  -Group instruction provided by verbal instruction, video, and written materials to support subject.  Instructors address signs and symptoms of angina and heart attacks.    Also discuss risk factors for heart disease and how to make changes to improve heart health risk factors.   Functional Fitness:  -Group instruction provided by verbal instruction, demonstration, patient participation, and written materials to support subject.  Instructors address safety measures for doing things around the house.  Discuss how to get up and down off the floor, how to pick things up properly, how to safely get out of a chair without assistance, and balance training.   Meditation and Mindfulness:  -Group instruction provided by verbal instruction, patient participation, and written materials to support subject.  Instructor addresses importance of mindfulness and meditation practice to help reduce stress and improve awareness.  Instructor also leads participants through a meditation exercise.    Stretching for Flexibility and Mobility:  -Group instruction provided by verbal instruction, patient participation, and written materials to support subject.  Instructors  lead participants through series of stretches that are designed to increase flexibility thus improving mobility.  These stretches are additional exercise for major muscle groups that are typically performed during regular warm up and cool down.   Hands Only CPR:  -Group verbal, video, and participation provides a basic overview of AHA guidelines for community CPR. Role-play of emergencies allow  participants the opportunity to practice calling for help and chest compression technique with discussion of AED use.   Hypertension: -Group verbal and written instruction that provides a basic overview of hypertension including the most recent diagnostic guidelines, risk factor reduction with self-care instructions and medication management.    Nutrition I class: Heart Healthy Eating:  -Group instruction provided by PowerPoint slides, verbal discussion, and written materials to support subject matter. The instructor gives an explanation and review of the Therapeutic Lifestyle Changes diet recommendations, which includes a discussion on lipid goals, dietary fat, sodium, fiber, plant stanol/sterol esters, sugar, and the components of a well-balanced, healthy diet.   Nutrition II class: Lifestyle Skills:  -Group instruction provided by PowerPoint slides, verbal discussion, and written materials to support subject matter. The instructor gives an explanation and review of label reading, grocery shopping for heart health, heart healthy recipe modifications, and ways to make healthier choices when eating out.   Diabetes Question & Answer:  -Group instruction provided by PowerPoint slides, verbal discussion, and written materials to support subject matter. The instructor gives an explanation and review of diabetes co-morbidities, pre- and post-prandial blood glucose goals, pre-exercise blood glucose goals, signs, symptoms, and treatment of hypoglycemia and hyperglycemia, and foot care basics.   Diabetes  Blitz:  -Group instruction provided by PowerPoint slides, verbal discussion, and written materials to support subject matter. The instructor gives an explanation and review of the physiology behind type 1 and type 2 diabetes, diabetes medications and rational behind using different medications, pre- and post-prandial blood glucose recommendations and Hemoglobin A1c goals, diabetes diet, and exercise including blood glucose guidelines for exercising safely.    Portion Distortion:  -Group instruction provided by PowerPoint slides, verbal discussion, written materials, and food models to support subject matter. The instructor gives an explanation of serving size versus portion size, changes in portions sizes over the last 20 years, and what consists of a serving from each food group.   Stress Management:  -Group instruction provided by verbal instruction, video, and written materials to support subject matter.  Instructors review role of stress in heart disease and how to cope with stress positively.     Exercising on Your Own:  -Group instruction provided by verbal instruction, power point, and written materials to support subject.  Instructors discuss benefits of exercise, components of exercise, frequency and intensity of exercise, and end points for exercise.  Also discuss use of nitroglycerin and activating EMS.  Review options of places to exercise outside of rehab.  Review guidelines for sex with heart disease.   Cardiac Drugs I:  -Group instruction provided by verbal instruction and written materials to support subject.  Instructor reviews cardiac drug classes: antiplatelets, anticoagulants, beta blockers, and statins.  Instructor discusses reasons, side effects, and lifestyle considerations for each drug class.   Cardiac Drugs II:  -Group instruction provided by verbal instruction and written materials to support subject.  Instructor reviews cardiac drug classes: angiotensin converting  enzyme inhibitors (ACE-I), angiotensin II receptor blockers (ARBs), nitrates, and calcium channel blockers.  Instructor discusses reasons, side effects, and lifestyle considerations for each drug class.   Anatomy and Physiology of the Circulatory System:  Group verbal and written instruction and models provide basic cardiac anatomy and physiology, with the coronary electrical and arterial systems. Review of: AMI, Angina, Valve disease, Heart Failure, Peripheral Artery Disease, Cardiac Arrhythmia, Pacemakers, and the ICD.   Other Education:  -Group or individual verbal, written, or video instructions that support the educational goals of  the cardiac rehab program.   Knowledge Questionnaire Score:     Knowledge Questionnaire Score - 06/13/17 1631      Knowledge Questionnaire Score   Pre Score 21/24      Core Components/Risk Factors/Patient Goals at Admission:     Personal Goals and Risk Factors at Admission - 06/13/17 1640      Core Components/Risk Factors/Patient Goals on Admission   Stress Yes   Intervention Offer individual and/or small group education and counseling on adjustment to heart disease, stress management and health-related lifestyle change. Teach and support self-help strategies.;Refer participants experiencing significant psychosocial distress to appropriate mental health specialists for further evaluation and treatment. When possible, include family members and significant others in education/counseling sessions.   Expected Outcomes Short Term: Participant demonstrates changes in health-related behavior, relaxation and other stress management skills, ability to obtain effective social support, and compliance with psychotropic medications if prescribed.;Long Term: Emotional wellbeing is indicated by absence of clinically significant psychosocial distress or social isolation.      Core Components/Risk Factors/Patient Goals Review:    Core Components/Risk  Factors/Patient Goals at Discharge (Final Review):    ITP Comments:     ITP Comments    Row Name 06/13/17 1456           ITP Comments Dr. Armanda Magic, Medical Director          Comments: Patient attended orientation from 1330 to 1545to review rules and guidelines for program. Completed 6 minute walk test, Intitial ITP, and exercise prescription.  VSS. Telemetry-AV paced.  Asymptomatic. Brief Psychosocial Assessment reveals anxiety and stress over the lack of control over her health with her redo AVR.  Pt chose to have tissue valve replacement in case she needed a third replacement and she did not want to go on anticoagulants.  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  Pt now fears she may have to go on anticoagulant and feels a loss of control. Pt sees a counselor and was reassured that cardiac rehab participation will help her decrease the anxiety she is feeling. Pt will have another reading of her PPM on Tuesday and she is hopeful she does not have any runs of afib. Will continue to support and positively encourage pt. Alanson Aly, BSN Cardiac and Emergency planning/management officer

## 2017-06-14 NOTE — Progress Notes (Signed)
Elspeth A Pumphrey 51 y.o. female       Nutrition Screen & Note  1. S/P heart valve repair    Past Medical History:  Diagnosis Date  . Cartilage tear    rt knee  . Heart murmur   . Medical history non-contributory    see valve rep-  . PONV (postoperative nausea and vomiting)    plus hypotension   Meds reviewed.  HT: Ht Readings from Last 1 Encounters:  06/13/17 5\' 3"  (1.6 m)    WT: Wt Readings from Last 3 Encounters:  06/13/17 132 lb 4.4 oz (60 kg)  04/18/17 134 lb (60.8 kg)  03/05/17 138 lb (62.6 kg)     BMI 23.5   Current tobacco use? No  Labs:  Lipid Panel  No results found for: CHOL, TRIG, HDL, CHOLHDL, VLDL, LDLCALC, LDLDIRECT  No results found for: HGBA1C CBG (last 3)  No results for input(s): GLUCAP in the last 72 hours.  Nutrition Note Spoke with pt. Nutrition plan and goals reviewed with pt. Pt is following Step 2 of the Therapeutic Lifestyle Changes diet.  Pt expressed understanding of the information reviewed. Pt aware of nutrition education classes offered and plans on attending nutrition classes.  Nutrition Diagnosis ? Food-and nutrition-related knowledge deficit related to lack of exposure to information as related to diagnosis of: ? CVD  Nutrition Intervention ? Pt's individual nutrition plan reviewed with pt.  Nutrition Goal(s):  ? Continue working toward eating "cleaner" foods ? Pt to identify food quantities necessary to achieve weight loss of 5 lb at graduation from cardiac rehab.   Plan:  Pt to attend nutrition classes ? Nutrition I ? Nutrition II ? Portion Distortion  Will provide client-centered nutrition education as part of interdisciplinary care.   Monitor and evaluate progress toward nutrition goal with team.  Mickle PlumbEdna Sinclaire Artiga, M.Ed, RD, LDN, CDE 06/14/2017 9:18 AM

## 2017-06-17 ENCOUNTER — Encounter (HOSPITAL_COMMUNITY)
Admission: RE | Admit: 2017-06-17 | Discharge: 2017-06-17 | Disposition: A | Discharge status: Home / Self Care | Payer: BLUE CROSS/BLUE SHIELD | Source: Ambulatory Visit | Attending: Cardiology | Admitting: Cardiology

## 2017-06-17 DIAGNOSIS — Z9889 Other specified postprocedural states: Secondary | ICD-10-CM

## 2017-06-17 DIAGNOSIS — Z952 Presence of prosthetic heart valve: Secondary | ICD-10-CM | POA: Diagnosis not present

## 2017-06-17 DIAGNOSIS — F4542 Pain disorder with related psychological factors: Secondary | ICD-10-CM | POA: Diagnosis not present

## 2017-06-17 NOTE — Progress Notes (Signed)
Daily Session Note  Patient Details  Name: Kayla Archer MRN: [id number] Date of Birth: 08-05-1966 Referring Provider:     CARDIAC REHAB PHASE II ORIENTATION from 06/13/2017 in [facility]  Referring Provider  Kela Millin MD      Encounter Date: 06/17/2017  Check In:   Capillary Blood Glucose: No results found for this or any previous visit (from the past 24 hour(s)).    History  Smoking Status  . Never Smoker  Smokeless Tobacco  . Never Used    Goals Met:  Exercise tolerated well  Goals Unmet:  Not Applicable  Comments: Kayla Archer started cardiac rehab today.  Pt tolerated light exercise without difficulty. VSS, telemetry-V paced, asymptomatic.  Medication list reconciled. Pt denies barriers to medicaiton compliance.  PSYCHOSOCIAL ASSESSMENT:  PHQ-2.Kayla Archer says she has been experiencing  Some depression and insomnia since her  Open heart surgery and pacemaker placement. Kayla Archer says she is receiving counseling  And is not taking an antidepressant at this time. Emotional support given. Kayla Archer reports that she has been experiencing soreness ever since her pacemaker has been implanted. Left anterior pacer incision clean dry and intact. Kayla Archer used no arms today. Dr Irven Shelling office called and notified about Nikka's complaints of pacer site soreness.  Pt enjoys playing tennis, wine and cheese.   Pt oriented to exercise equipment and routine.    Understanding verbalized. Barnet Pall, RN,BSN 06/17/2017 4:29 PM   Dr. Fransico Him is Medical Director for Cardiac Rehab at [facility].

## 2017-06-18 DIAGNOSIS — Z9889 Other specified postprocedural states: Secondary | ICD-10-CM | POA: Diagnosis not present

## 2017-06-18 DIAGNOSIS — I480 Paroxysmal atrial fibrillation: Secondary | ICD-10-CM | POA: Diagnosis not present

## 2017-06-18 DIAGNOSIS — Z954 Presence of other heart-valve replacement: Secondary | ICD-10-CM | POA: Diagnosis not present

## 2017-06-18 DIAGNOSIS — Z8679 Personal history of other diseases of the circulatory system: Secondary | ICD-10-CM | POA: Diagnosis not present

## 2017-06-18 DIAGNOSIS — Z950 Presence of cardiac pacemaker: Secondary | ICD-10-CM | POA: Diagnosis not present

## 2017-06-19 ENCOUNTER — Encounter (HOSPITAL_COMMUNITY)
Admission: RE | Admit: 2017-06-19 | Discharge: 2017-06-19 | Disposition: A | Discharge status: Home / Self Care | Payer: BLUE CROSS/BLUE SHIELD | Source: Ambulatory Visit | Attending: Cardiology | Admitting: Cardiology

## 2017-06-19 DIAGNOSIS — Z9889 Other specified postprocedural states: Secondary | ICD-10-CM

## 2017-06-19 DIAGNOSIS — Z952 Presence of prosthetic heart valve: Secondary | ICD-10-CM | POA: Diagnosis not present

## 2017-06-21 ENCOUNTER — Encounter (HOSPITAL_COMMUNITY)
Admission: RE | Admit: 2017-06-21 | Discharge: 2017-06-21 | Disposition: A | Discharge status: Home / Self Care | Payer: BLUE CROSS/BLUE SHIELD | Source: Ambulatory Visit | Attending: Cardiology | Admitting: Cardiology

## 2017-06-21 DIAGNOSIS — Z952 Presence of prosthetic heart valve: Secondary | ICD-10-CM | POA: Diagnosis not present

## 2017-06-21 DIAGNOSIS — Z9889 Other specified postprocedural states: Secondary | ICD-10-CM

## 2017-06-24 ENCOUNTER — Encounter (HOSPITAL_COMMUNITY)
Admission: RE | Admit: 2017-06-24 | Discharge: 2017-06-24 | Disposition: A | Discharge status: Home / Self Care | Payer: BLUE CROSS/BLUE SHIELD | Source: Ambulatory Visit | Attending: Cardiology | Admitting: Cardiology

## 2017-06-24 DIAGNOSIS — I350 Nonrheumatic aortic (valve) stenosis: Secondary | ICD-10-CM | POA: Diagnosis not present

## 2017-06-24 DIAGNOSIS — Z950 Presence of cardiac pacemaker: Secondary | ICD-10-CM

## 2017-06-24 DIAGNOSIS — Z1211 Encounter for screening for malignant neoplasm of colon: Secondary | ICD-10-CM | POA: Diagnosis not present

## 2017-06-24 DIAGNOSIS — Z9889 Other specified postprocedural states: Secondary | ICD-10-CM

## 2017-06-24 DIAGNOSIS — Z952 Presence of prosthetic heart valve: Secondary | ICD-10-CM | POA: Diagnosis not present

## 2017-06-24 DIAGNOSIS — G4700 Insomnia, unspecified: Secondary | ICD-10-CM | POA: Diagnosis not present

## 2017-06-24 HISTORY — DX: Presence of cardiac pacemaker: Z95.0

## 2017-06-25 DIAGNOSIS — Z6823 Body mass index (BMI) 23.0-23.9, adult: Secondary | ICD-10-CM | POA: Diagnosis not present

## 2017-06-25 DIAGNOSIS — R5383 Other fatigue: Secondary | ICD-10-CM | POA: Diagnosis not present

## 2017-06-25 DIAGNOSIS — N951 Menopausal and female climacteric states: Secondary | ICD-10-CM | POA: Diagnosis not present

## 2017-06-25 DIAGNOSIS — D649 Anemia, unspecified: Secondary | ICD-10-CM | POA: Diagnosis not present

## 2017-06-25 DIAGNOSIS — Z01419 Encounter for gynecological examination (general) (routine) without abnormal findings: Secondary | ICD-10-CM | POA: Diagnosis not present

## 2017-06-25 NOTE — Progress Notes (Signed)
Cardiac Individual Treatment Plan  Patient Details  Name: Kayla BullocksMitrina A Archer MRN: [id number] Date of Birth: 1966-10-04 Referring Provider:     CARDIAC REHAB [facility]ATION [facility]  Referring Provider  Delrae RendGanji, Jagadeesh MD      Initial Encounter Date:    [facility]  Date  06/13/17  Referring Provider  Delrae RendGanji, Jagadeesh MD      Visit Diagnosis: S/P heart valve repair  Patient's Home Medications on Admission:  Current Outpatient Prescriptions:  .  ALPRAZolam (XANAX) 0.25 MG tablet, Take 1 tablet (0.25 mg total) by mouth at bedtime as needed for anxiety., Disp: 30 tablet, Rfl: 0 .  aspirin EC 81 MG tablet, Take 81 mg by mouth 2 (two) times daily., Disp: , Rfl:  .  ibuprofen (MOTRIN IB) 200 MG tablet, Take 600 mg by mouth every 6 (six) hours as needed for moderate pain., Disp: , Rfl:  .  metoprolol tartrate (LOPRESSOR) 25 MG tablet, Take 25 mg by mouth 2 (two) times daily. , Disp: , Rfl:   Past Medical History: Past Medical History:  Diagnosis Date  . Cartilage tear    rt knee  . Heart murmur   . Medical history non-contributory    see valve rep-  . PONV (postoperative nausea and vomiting)    plus hypotension    Tobacco Use: History  Smoking Status  . Never Smoker  Smokeless Tobacco  . Never Used    Labs: Recent Review Flowsheet Data    There is no flowsheet data to display.      Capillary Blood Glucose: No results found for: GLUCAP   Exercise Target Goals:    Exercise Program Goal: Individual exercise prescription set with THRR, safety & activity barriers. Participant demonstrates ability to understand and report RPE using BORG scale, to self-measure pulse accurately, and to acknowledge the importance of the exercise prescription.  Exercise Prescription Goal: Starting with aerobic activity 30 plus minutes a day, 3 days  per week for initial exercise prescription. Provide home exercise prescription and guidelines that participant acknowledges understanding prior to discharge.  Activity Barriers & Risk Stratification:     Activity Barriers & Cardiac Risk Stratification - 06/13/17 1455      Activity Barriers & Cardiac Risk Stratification   Activity Barriers None   Cardiac Risk Stratification Moderate      6 Minute Walk:     6 Minute Walk    Row Name 06/13/17 1632         6 Minute Walk   Phase Initial     Distance 1220 feet     Walk Time 6 minutes     # of Rest Breaks 0     MPH 2.3     METS 4     RPE 12     VO2 Peak 14     Symptoms No     Resting HR 80 bpm     Resting BP 101/65     Max Ex. HR 125 bpm     Max Ex. BP 119/79     2 Minute Post BP 110/70        Oxygen Initial Assessment:   Oxygen Re-Evaluation:   Oxygen Discharge (Final Oxygen Re-Evaluation):   Initial Exercise Prescription:     Initial Exercise Prescription - 06/13/17 1600      Date of Initial Exercise RX and Referring Provider  Date 06/13/17   Referring Provider Delrae Rend MD     Treadmill   MPH 2.5   Grade 2   Minutes 10   METs 3     Bike   Level 0.7   Minutes 10   METs 2.8     NuStep   Level 2   SPM 85   Minutes 10   METs 2.7     Prescription Details   Frequency (times per week) 3   Duration Progress to 45 minutes of aerobic exercise without signs/symptoms of physical distress     Intensity   THRR 40-80% of Max Heartrate 67-134   Ratings of Perceived Exertion 11-13   Perceived Dyspnea 0-4     Progression   Progression Continue to progress workloads to maintain intensity without signs/symptoms of physical distress.     Resistance Training   Training Prescription Yes   Weight 2   Reps 10-15      Perform Capillary Blood Glucose checks as needed.  Exercise Prescription Changes:     Exercise Prescription Changes    Row Name 06/24/17 1100             Response to  Exercise   Blood Pressure (Admit) 118/82       Blood Pressure (Exercise) 118/78       Blood Pressure (Exit) 104/68       Heart Rate (Admit) 92 bpm       Heart Rate (Exercise) 114 bpm       Heart Rate (Exit) 82 bpm       Duration Progress to 45 minutes of aerobic exercise without signs/symptoms of physical distress       Intensity THRR unchanged         Progression   Progression Continue to progress workloads to maintain intensity without signs/symptoms of physical distress.       Average METs 3         Resistance Training   Training Prescription Yes       Weight 2       Reps 10-15         Treadmill   MPH 2.5       Grade 2       Minutes 10       METs 3         Bike   Level 0.7       Minutes 10       METs 2.8         NuStep   Level 2       SPM 85       Minutes 10       METs 2.7          Exercise Comments:     Exercise Comments    Row Name 06/24/17 1142           Exercise Comments Pt is off to a great start with exercse          Exercise Goals and Review:     Exercise Goals    Row Name 06/13/17 1456             Exercise Goals   Increase Physical Activity Yes       Intervention Develop an individualized exercise prescription for aerobic and resistive training based on initial evaluation findings, risk stratification, comorbidities and participant's personal goals.;Provide advice, education, support and counseling about physical activity/exercise needs.       Expected Outcomes Achievement of increased  cardiorespiratory fitness and enhanced flexibility, muscular endurance and strength shown through measurements of functional capacity and personal statement of participant.       Increase Strength and Stamina Yes       Intervention Provide advice, education, support and counseling about physical activity/exercise needs.;Develop an individualized exercise prescription for aerobic and resistive training based on initial evaluation findings, risk stratification,  comorbidities and participant's personal goals.       Expected Outcomes Achievement of increased cardiorespiratory fitness and enhanced flexibility, muscular endurance and strength shown through measurements of functional capacity and personal statement of participant.          Exercise Goals Re-Evaluation :    Discharge Exercise Prescription (Final Exercise Prescription Changes):     Exercise Prescription Changes - 06/24/17 1100      Response to Exercise   Blood Pressure (Admit) 118/82   Blood Pressure (Exercise) 118/78   Blood Pressure (Exit) 104/68   Heart Rate (Admit) 92 bpm   Heart Rate (Exercise) 114 bpm   Heart Rate (Exit) 82 bpm   Duration Progress to 45 minutes of aerobic exercise without signs/symptoms of physical distress   Intensity THRR unchanged     Progression   Progression Continue to progress workloads to maintain intensity without signs/symptoms of physical distress.   Average METs 3     Resistance Training   Training Prescription Yes   Weight 2   Reps 10-15     Treadmill   MPH 2.5   Grade 2   Minutes 10   METs 3     Bike   Level 0.7   Minutes 10   METs 2.8     NuStep   Level 2   SPM 85   Minutes 10   METs 2.7      Nutrition:  Target Goals: Understanding of nutrition guidelines, daily intake of sodium 1500mg , cholesterol 200mg , calories 30% from fat and 7% or less from saturated fats, daily to have 5 or more servings of fruits and vegetables.  Biometrics:     Pre Biometrics - 06/13/17 1639      Pre Biometrics   Waist Circumference 32.5 inches   Hip Circumference 38 inches   Waist to Hip Ratio 0.86 %   Triceps Skinfold 36 mm   % Body Fat 36.3 %   Grip Strength 31 kg   Flexibility 15.5 in   Single Leg Stand 15.4 seconds       Nutrition Therapy Plan and Nutrition Goals:     Nutrition Therapy & Goals - 06/14/17 0922      Nutrition Therapy   Diet Therapeutic Lifestyle Changes     Personal Nutrition Goals   Nutrition Goal  Continue working toward eating "cleaner" foods   Personal Goal #2 Pt to identify food quantities necessary to achieve weight loss of 5 lb at graduation from cardiac rehab.      Intervention Plan   Intervention Prescribe, educate and counsel regarding individualized specific dietary modifications aiming towards targeted core components such as weight, hypertension, lipid management, diabetes, heart failure and other comorbidities.   Expected Outcomes Short Term Goal: Understand basic principles of dietary content, such as calories, fat, sodium, cholesterol and nutrients.;Long Term Goal: Adherence to prescribed nutrition plan.      Nutrition Discharge: Nutrition Scores:     Nutrition Assessments - 06/14/17 0922      MEDFICTS Scores   Pre Score 37      Nutrition Goals Re-Evaluation:   Nutrition Goals Re-Evaluation:  Nutrition Goals Discharge (Final Nutrition Goals Re-Evaluation):   Psychosocial: Target Goals: Acknowledge presence or absence of significant depression and/or stress, maximize coping skills, provide positive support system. Participant is able to verbalize types and ability to use techniques and skills needed for reducing stress and depression.  Initial Review & Psychosocial Screening:     Initial Psych Review & Screening - 06/13/17 1705      Initial Review   Current issues with Current Stress Concerns;Current Anxiety/Panic   Source of Stress Concerns Chronic Illness;Unable to participate in former interests or hobbies     Family Dynamics   Good Support System? Yes     Barriers   Psychosocial barriers to participate in program The patient should benefit from training in stress management and relaxation.     Screening Interventions   Interventions To provide support and resources with identified psychosocial needs  pt sees a counselor      Quality of Life Scores:     Quality of Life - 06/13/17 1640      Quality of Life Scores   Health/Function Pre  (P)  13.29 %   Socioeconomic Pre (P)  26.79 %   Psych/Spiritual Pre (P)  17.42 %   Family Pre (P)  27.6 %      PHQ-9: Recent Review Flowsheet Data    Depression screen [facility] 2/9 06/17/2017   Decreased Interest 1   Down, Depressed, Hopeless 1   PHQ - 2 Score 2   Altered sleeping 2   Tired, decreased energy 0   Change in appetite 0   Feeling bad or failure about yourself  0   Trouble concentrating 0   Moving slowly or fidgety/restless 0   Suicidal thoughts 0   PHQ-9 Score 4   Difficult doing work/chores Not difficult at all     Interpretation of Total Score  Total Score Depression Severity:  1-4 = Minimal depression, 5-9 = Mild depression, 10-14 = Moderate depression, 15-19 = Moderately severe depression, 20-27 = Severe depression   Psychosocial Evaluation and Intervention:   Psychosocial Re-Evaluation:     Psychosocial Re-Evaluation    Row Name 06/25/17 1713             Psychosocial Re-Evaluation   Current issues with Current Stress Concerns       Comments Kayla Archer reports feeling better since she has been partcipating in phase 2 cardiac rehab.       Interventions Stress management education         Initial Review   Source of Stress Concerns Chronic Illness;Unable to participate in former interests or hobbies          Psychosocial Discharge (Final Psychosocial Re-Evaluation):     Psychosocial Re-Evaluation - 06/25/17 1713      Psychosocial Re-Evaluation   Current issues with Current Stress Concerns   Comments Kayla Archer reports feeling better since she has been partcipating in phase 2 cardiac rehab.   Interventions Stress management education     Initial Review   Source of Stress Concerns Chronic Illness;Unable to participate in former interests or hobbies      Vocational Rehabilitation: Provide vocational rehab assistance to qualifying candidates.   Vocational Rehab Evaluation & Intervention:   Education: Education Goals: Education classes will be  provided on a weekly basis, covering required topics. Participant will state understanding/return demonstration of topics presented.  Learning Barriers/Preferences:     Learning Barriers/Preferences - 06/13/17 1455      Learning Barriers/Preferences   Learning Barriers None  Learning Preferences Verbal Instruction;Skilled Demonstration;Audio      Education Topics: Count Your Pulse:  -Group instruction provided by verbal instruction, demonstration, patient participation and written materials to support subject.  Instructors address importance of being able to find your pulse and how to count your pulse when at home without a heart monitor.  Patients get hands on experience counting their pulse with staff help and individually.   Heart Attack, Angina, and Risk Factor Modification:  -Group instruction provided by verbal instruction, video, and written materials to support subject.  Instructors address signs and symptoms of angina and heart attacks.    Also discuss risk factors for heart disease and how to make changes to improve heart health risk factors.   Functional Fitness:  -Group instruction provided by verbal instruction, demonstration, patient participation, and written materials to support subject.  Instructors address safety measures for doing things around the house.  Discuss how to get up and down off the floor, how to pick things up properly, how to safely get out of a chair without assistance, and balance training.   Meditation and Mindfulness:  -Group instruction provided by verbal instruction, patient participation, and written materials to support subject.  Instructor addresses importance of mindfulness and meditation practice to help reduce stress and improve awareness.  Instructor also leads participants through a meditation exercise.    Stretching for Flexibility and Mobility:  -Group instruction provided by verbal instruction, patient participation, and written  materials to support subject.  Instructors lead participants through series of stretches that are designed to increase flexibility thus improving mobility.  These stretches are additional exercise for major muscle groups that are typically performed during regular warm up and cool down.   Hands Only CPR:  -Group verbal, video, and participation provides a basic overview of AHA guidelines for community CPR. Role-play of emergencies allow participants the opportunity to practice calling for help and chest compression technique with discussion of AED use.   Hypertension: -Group verbal and written instruction that provides a basic overview of hypertension including the most recent diagnostic guidelines, risk factor reduction with self-care instructions and medication management.    Nutrition I class: Heart Healthy Eating:  -Group instruction provided by PowerPoint slides, verbal discussion, and written materials to support subject matter. The instructor gives an explanation and review of the Therapeutic Lifestyle Changes diet recommendations, which includes a discussion on lipid goals, dietary fat, sodium, fiber, plant stanol/sterol esters, sugar, and the components of a well-balanced, healthy diet.   Nutrition II class: Lifestyle Skills:  -Group instruction provided by PowerPoint slides, verbal discussion, and written materials to support subject matter. The instructor gives an explanation and review of label reading, grocery shopping for heart health, heart healthy recipe modifications, and ways to make healthier choices when eating out.   Diabetes Question & Answer:  -Group instruction provided by PowerPoint slides, verbal discussion, and written materials to support subject matter. The instructor gives an explanation and review of diabetes co-morbidities, pre- and post-prandial blood glucose goals, pre-exercise blood glucose goals, signs, symptoms, and treatment of hypoglycemia and  hyperglycemia, and foot care basics.   Diabetes Blitz:  -Group instruction provided by PowerPoint slides, verbal discussion, and written materials to support subject matter. The instructor gives an explanation and review of the physiology behind type 1 and type 2 diabetes, diabetes medications and rational behind using different medications, pre- and post-prandial blood glucose recommendations and Hemoglobin A1c goals, diabetes diet, and exercise including blood glucose guidelines for exercising safely.  Portion Distortion:  -Group instruction provided by PowerPoint slides, verbal discussion, written materials, and food models to support subject matter. The instructor gives an explanation of serving size versus portion size, changes in portions sizes over the last 20 years, and what consists of a serving from each food group.   Stress Management:  -Group instruction provided by verbal instruction, video, and written materials to support subject matter.  Instructors review role of stress in heart disease and how to cope with stress positively.     Exercising on Your Own:  -Group instruction provided by verbal instruction, power point, and written materials to support subject.  Instructors discuss benefits of exercise, components of exercise, frequency and intensity of exercise, and end points for exercise.  Also discuss use of nitroglycerin and activating EMS.  Review options of places to exercise outside of rehab.  Review guidelines for sex with heart disease.   Cardiac Drugs I:  -Group instruction provided by verbal instruction and written materials to support subject.  Instructor reviews cardiac drug classes: antiplatelets, anticoagulants, beta blockers, and statins.  Instructor discusses reasons, side effects, and lifestyle considerations for each drug class.   Cardiac Drugs II:  -Group instruction provided by verbal instruction and written materials to support subject.  Instructor  reviews cardiac drug classes: angiotensin converting enzyme inhibitors (ACE-I), angiotensin II receptor blockers (ARBs), nitrates, and calcium channel blockers.  Instructor discusses reasons, side effects, and lifestyle considerations for each drug class.   CARDIAC REHAB PHASE II EXERCISE from 06/19/2017 in [facility] CARDIAC REHAB  Date  06/19/17  Instruction Review Code  2- meets goals/outcomes      Anatomy and Physiology of the Circulatory System:  Group verbal and written instruction and models provide basic cardiac anatomy and physiology, with the coronary electrical and arterial systems. Review of: AMI, Angina, Valve disease, Heart Failure, Peripheral Artery Disease, Cardiac Arrhythmia, Pacemakers, and the ICD.   Other Education:  -Group or individual verbal, written, or video instructions that support the educational goals of the cardiac rehab program.   Knowledge Questionnaire Score:     Knowledge Questionnaire Score - 06/13/17 1631      Knowledge Questionnaire Score   Pre Score 21/24      Core Components/Risk Factors/Patient Goals at Admission:     Personal Goals and Risk Factors at Admission - 06/13/17 1640      Core Components/Risk Factors/Patient Goals on Admission   Stress Yes   Intervention Offer individual and/or small group education and counseling on adjustment to heart disease, stress management and health-related lifestyle change. Teach and support self-help strategies.;Refer participants experiencing significant psychosocial distress to appropriate mental health specialists for further evaluation and treatment. When possible, include family members and significant others in education/counseling sessions.   Expected Outcomes Short Term: Participant demonstrates changes in health-related behavior, relaxation and other stress management skills, ability to obtain effective social support, and compliance with psychotropic medications if  prescribed.;Long Term: Emotional wellbeing is indicated by absence of clinically significant psychosocial distress or social isolation.      Core Components/Risk Factors/Patient Goals Review:    Core Components/Risk Factors/Patient Goals at Discharge (Final Review):    ITP Comments:     ITP Comments    Row Name 06/13/17 1456           ITP Comments Dr. Armanda Magic, Medical Director          Comments: Kayla Archer is making expected progress toward personal goals after completing 4 sessions. Recommend continued exercise  and life style modification education including  stress management and relaxation techniques to decrease cardiac risk profile. Kayla Archer is off to a good start with exercise and is not as anxious.Gladstone Lighter, RN,BSN 06/25/2017 5:17 PM

## 2017-06-26 ENCOUNTER — Encounter (HOSPITAL_COMMUNITY)
Admission: RE | Admit: 2017-06-26 | Discharge: 2017-06-26 | Disposition: A | Discharge status: Home / Self Care | Payer: BLUE CROSS/BLUE SHIELD | Source: Ambulatory Visit | Attending: Cardiology | Admitting: Cardiology

## 2017-06-26 DIAGNOSIS — Z9889 Other specified postprocedural states: Secondary | ICD-10-CM

## 2017-06-28 ENCOUNTER — Encounter (HOSPITAL_COMMUNITY)
Admission: RE | Admit: 2017-06-28 | Discharge: 2017-06-28 | Disposition: A | Discharge status: Home / Self Care | Payer: BLUE CROSS/BLUE SHIELD | Source: Ambulatory Visit | Attending: Cardiology | Admitting: Cardiology

## 2017-06-28 DIAGNOSIS — Z9889 Other specified postprocedural states: Secondary | ICD-10-CM

## 2017-06-28 DIAGNOSIS — Z952 Presence of prosthetic heart valve: Secondary | ICD-10-CM | POA: Diagnosis not present

## 2017-07-01 ENCOUNTER — Encounter (HOSPITAL_COMMUNITY)
Admission: RE | Admit: 2017-07-01 | Discharge: 2017-07-01 | Disposition: A | Discharge status: Home / Self Care | Payer: BLUE CROSS/BLUE SHIELD | Source: Ambulatory Visit | Attending: Cardiology | Admitting: Cardiology

## 2017-07-01 DIAGNOSIS — Z952 Presence of prosthetic heart valve: Secondary | ICD-10-CM | POA: Diagnosis not present

## 2017-07-01 DIAGNOSIS — Z9889 Other specified postprocedural states: Secondary | ICD-10-CM

## 2017-07-03 ENCOUNTER — Encounter (HOSPITAL_COMMUNITY)
Admission: RE | Admit: 2017-07-03 | Discharge: 2017-07-03 | Disposition: A | Discharge status: Home / Self Care | Payer: BLUE CROSS/BLUE SHIELD | Source: Ambulatory Visit | Attending: Cardiology | Admitting: Cardiology

## 2017-07-03 DIAGNOSIS — Z9889 Other specified postprocedural states: Secondary | ICD-10-CM

## 2017-07-03 DIAGNOSIS — Z952 Presence of prosthetic heart valve: Secondary | ICD-10-CM | POA: Diagnosis not present

## 2017-07-04 ENCOUNTER — Encounter: Payer: Self-pay | Admitting: Family Medicine

## 2017-07-04 DIAGNOSIS — Z954 Presence of other heart-valve replacement: Secondary | ICD-10-CM | POA: Diagnosis not present

## 2017-07-05 ENCOUNTER — Encounter (HOSPITAL_COMMUNITY)
Admission: RE | Admit: 2017-07-05 | Discharge: 2017-07-05 | Disposition: A | Discharge status: Home / Self Care | Payer: BLUE CROSS/BLUE SHIELD | Source: Ambulatory Visit | Attending: Cardiology | Admitting: Cardiology

## 2017-07-05 ENCOUNTER — Encounter (HOSPITAL_COMMUNITY): Payer: BLUE CROSS/BLUE SHIELD

## 2017-07-05 DIAGNOSIS — Z952 Presence of prosthetic heart valve: Secondary | ICD-10-CM | POA: Diagnosis not present

## 2017-07-05 DIAGNOSIS — Z9889 Other specified postprocedural states: Secondary | ICD-10-CM

## 2017-07-05 NOTE — Progress Notes (Signed)
Daily Session Note  Patient Details  Name: Kayla Archer MRN: [id number] Date of Birth: [birth year] Referring Provider:     CARDIAC REHAB PHASE II ORIENTATION from 06/13/2017 in [place]  Referring Provider  Kela Millin MD      Encounter Date: 07/05/2017  Check In:     Session Check In - 07/05/17 1524      Check-In   Location [facility]   Staff Present Cleda Mccreedy, MS, Exercise Physiologist;Joann Rion, RN, Deland Pretty, MS, ACSM CEP, Exercise Physiologist   Supervising physician immediately available to respond to emergencies [facility] immediately available   Physician(s) Dr. [place] Pink   Medication changes reported     No   Fall or balance concerns reported    No   Tobacco Cessation No Change   Warm-up and Cool-down Performed as group-led instruction   Resistance Training Performed No   VAD Patient? No     Pain Assessment   Currently in Pain? No/denies   Multiple Pain Sites No      Capillary Blood Glucose: No results found for this or any previous visit (from the past 24 hour(s)).    History  Smoking Status  . Never Smoker  Smokeless Tobacco  . Never Used    Goals Met:  Exercise tolerated well  Goals Unmet:  Not Applicable  Comments: pt arrived at cardiac rehab c/o swelling and itching in left clavicle area.  Pt denies pain or dyspnea. No difficulty with speech or swallowing. No rash, redness, drainage. swellling does not extend to ppm insertion site.  Trace edema noted. Tissue soft with mild tenderness to touch.  No edema, drainage, swelling  at PPM insertion site only tenderness to touch.  Pt states she has contacted Dr. Einar Gip office with no new recommendations. Pt tolerated exercise without difficulty. Pt instructed to present to ED for sudden, severe symptoms.  Pt should present to urgent care if symptoms persist or worsen.  Understanding verbalized.    Dr. Fransico Him is Medical  Director for Cardiac Rehab at [facility].

## 2017-07-08 ENCOUNTER — Encounter (HOSPITAL_COMMUNITY)
Admission: RE | Admit: 2017-07-08 | Discharge: 2017-07-08 | Disposition: A | Discharge status: Home / Self Care | Payer: BLUE CROSS/BLUE SHIELD | Source: Ambulatory Visit | Attending: Cardiology | Admitting: Cardiology

## 2017-07-08 DIAGNOSIS — Z9889 Other specified postprocedural states: Secondary | ICD-10-CM

## 2017-07-08 DIAGNOSIS — Z952 Presence of prosthetic heart valve: Secondary | ICD-10-CM | POA: Diagnosis not present

## 2017-07-09 DIAGNOSIS — Z8679 Personal history of other diseases of the circulatory system: Secondary | ICD-10-CM | POA: Diagnosis not present

## 2017-07-09 DIAGNOSIS — Z9889 Other specified postprocedural states: Secondary | ICD-10-CM | POA: Diagnosis not present

## 2017-07-09 DIAGNOSIS — Z954 Presence of other heart-valve replacement: Secondary | ICD-10-CM | POA: Diagnosis not present

## 2017-07-09 DIAGNOSIS — I480 Paroxysmal atrial fibrillation: Secondary | ICD-10-CM | POA: Diagnosis not present

## 2017-07-10 ENCOUNTER — Encounter (HOSPITAL_COMMUNITY): Payer: BLUE CROSS/BLUE SHIELD

## 2017-07-12 ENCOUNTER — Encounter (HOSPITAL_COMMUNITY): Payer: BLUE CROSS/BLUE SHIELD

## 2017-07-15 ENCOUNTER — Encounter (HOSPITAL_COMMUNITY)
Admission: RE | Admit: 2017-07-15 | Discharge: 2017-07-15 | Disposition: A | Discharge status: Home / Self Care | Payer: BLUE CROSS/BLUE SHIELD | Source: Ambulatory Visit | Attending: Cardiology | Admitting: Cardiology

## 2017-07-15 DIAGNOSIS — Z9889 Other specified postprocedural states: Secondary | ICD-10-CM

## 2017-07-15 DIAGNOSIS — Z950 Presence of cardiac pacemaker: Secondary | ICD-10-CM | POA: Diagnosis not present

## 2017-07-15 DIAGNOSIS — D649 Anemia, unspecified: Secondary | ICD-10-CM | POA: Diagnosis not present

## 2017-07-15 DIAGNOSIS — Z952 Presence of prosthetic heart valve: Secondary | ICD-10-CM | POA: Insufficient documentation

## 2017-07-15 DIAGNOSIS — Z8619 Personal history of other infectious and parasitic diseases: Secondary | ICD-10-CM | POA: Insufficient documentation

## 2017-07-15 DIAGNOSIS — R222 Localized swelling, mass and lump, trunk: Secondary | ICD-10-CM | POA: Diagnosis not present

## 2017-07-15 DIAGNOSIS — Z45010 Encounter for checking and testing of cardiac pacemaker pulse generator [battery]: Secondary | ICD-10-CM | POA: Diagnosis not present

## 2017-07-15 DIAGNOSIS — G4700 Insomnia, unspecified: Secondary | ICD-10-CM | POA: Diagnosis not present

## 2017-07-15 NOTE — Progress Notes (Signed)
Note Spoke with pt. Nutrition Plan and Nutrition Survey goals reviewed with pt. Pt is following Step 2 of the Therapeutic Lifestyle Changes diet. Pt expressed understanding of the information reviewed. Pt aware of nutrition education classes offered and does not plan on attending nutrition classes at this time. Pt declined Nutrition class handouts today.   Nutrition Intervention ? Pt's individual nutrition plan reviewed with pt. ? Benefits of adopting Therapeutic Lifestyle Changes discussed when Med[employer]icts reviewed.   ? Continue client-centered nutrition education by RD, as part of interdisciplinary care.  Mickle PlumbEdna Tria Noguera, M.Ed, RD, LDN, CDE 07/15/2017 3:34 PM

## 2017-07-15 NOTE — Progress Notes (Signed)
Medication list updated Claira said her metoprolol was decreased to 25 mg once a day by Dr Jacinto HalimGanji. Will continue to monitor the patient throughout  the program.Maria Harlon FlorWhitaker, RN,BSN 07/15/2017 3:41 PM

## 2017-07-17 ENCOUNTER — Encounter (HOSPITAL_COMMUNITY): Payer: BLUE CROSS/BLUE SHIELD

## 2017-07-19 ENCOUNTER — Encounter (HOSPITAL_COMMUNITY)
Admission: RE | Admit: 2017-07-19 | Discharge: 2017-07-19 | Disposition: A | Discharge status: Home / Self Care | Payer: BLUE CROSS/BLUE SHIELD | Source: Ambulatory Visit | Attending: Cardiology | Admitting: Cardiology

## 2017-07-19 DIAGNOSIS — Z9889 Other specified postprocedural states: Secondary | ICD-10-CM

## 2017-07-19 DIAGNOSIS — Z952 Presence of prosthetic heart valve: Secondary | ICD-10-CM | POA: Diagnosis not present

## 2017-07-22 ENCOUNTER — Encounter (HOSPITAL_COMMUNITY)
Admission: RE | Admit: 2017-07-22 | Discharge: 2017-07-22 | Disposition: A | Discharge status: Home / Self Care | Payer: BLUE CROSS/BLUE SHIELD | Source: Ambulatory Visit | Attending: Cardiology | Admitting: Cardiology

## 2017-07-22 DIAGNOSIS — Z9889 Other specified postprocedural states: Secondary | ICD-10-CM

## 2017-07-22 DIAGNOSIS — Z952 Presence of prosthetic heart valve: Secondary | ICD-10-CM | POA: Diagnosis not present

## 2017-07-24 ENCOUNTER — Encounter (HOSPITAL_COMMUNITY)
Admission: RE | Admit: 2017-07-24 | Discharge: 2017-07-24 | Disposition: A | Discharge status: Home / Self Care | Payer: BLUE CROSS/BLUE SHIELD | Source: Ambulatory Visit | Attending: Cardiology | Admitting: Cardiology

## 2017-07-24 DIAGNOSIS — Z952 Presence of prosthetic heart valve: Secondary | ICD-10-CM | POA: Diagnosis not present

## 2017-07-24 DIAGNOSIS — Z9889 Other specified postprocedural states: Secondary | ICD-10-CM

## 2017-07-25 ENCOUNTER — Encounter: Payer: Self-pay | Admitting: Physician Assistant

## 2017-07-25 NOTE — Progress Notes (Signed)
Cardiac Individual Treatment Plan  Patient Details  Name: Kayla Archer MRN: [id number] Date of Birth: [birth year] Referring Provider:     CARDIAC REHAB PHASE II ORIENTATION from 06/13/2017 in [facility] CARDIAC REHAB  Referring Provider  Delrae Rend MD      Initial Encounter Date:    CARDIAC REHAB PHASE II ORIENTATION from 06/13/2017 in [facility] CARDIAC REHAB  Date  06/13/17  Referring Provider  Delrae Rend MD      Visit Diagnosis: S/P heart valve repair  Patient's Home Medications on Admission:  Current Outpatient Prescriptions:  .  ALPRAZolam (XANAX) 0.25 MG tablet, Take 1 tablet (0.25 mg total) by mouth at bedtime as needed for anxiety., Disp: 30 tablet, Rfl: 0 .  aspirin EC 81 MG tablet, Take 81 mg by mouth 2 (two) times daily., Disp: , Rfl:  .  ibuprofen (MOTRIN IB) 200 MG tablet, Take 600 mg by mouth every 6 (six) hours as needed for moderate pain., Disp: , Rfl:  .  metoprolol tartrate (LOPRESSOR) 25 MG tablet, Take 25 mg by mouth daily. , Disp: , Rfl:   Past Medical History: Past Medical History:  Diagnosis Date  . Cartilage tear    rt knee  . Heart murmur   . Medical history non-contributory    see valve rep-  . PONV (postoperative nausea and vomiting)    plus hypotension    Tobacco Use: History  Smoking Status  . Never Smoker  Smokeless Tobacco  . Never Used    Labs: Recent Review Flowsheet Data    There is no flowsheet data to display.      Capillary Blood Glucose: No results found for: GLUCAP   Exercise Target Goals:    Exercise Program Goal: Individual exercise prescription set with THRR, safety & activity barriers. Participant demonstrates ability to understand and report RPE using BORG scale, to self-measure pulse accurately, and to acknowledge the importance of the exercise prescription.  Exercise Prescription Goal: Starting with aerobic activity 30 plus minutes a day, 3 days per week for  initial exercise prescription. Provide home exercise prescription and guidelines that participant acknowledges understanding prior to discharge.  Activity Barriers & Risk Stratification:     Activity Barriers & Cardiac Risk Stratification - 06/13/17 1455      Activity Barriers & Cardiac Risk Stratification   Activity Barriers None   Cardiac Risk Stratification Moderate      6 Minute Walk:     6 Minute Walk    Row Name 06/13/17 1632         6 Minute Walk   Phase Initial     Distance 1220 feet     Walk Time 6 minutes     # of Rest Breaks 0     MPH 2.3     METS 4     RPE 12     VO2 Peak 14     Symptoms No     Resting HR 80 bpm     Resting BP 101/65     Max Ex. HR 125 bpm     Max Ex. BP 119/79     2 Minute Post BP 110/70        Oxygen Initial Assessment:   Oxygen Re-Evaluation:   Oxygen Discharge (Final Oxygen Re-Evaluation):   Initial Exercise Prescription:     Initial Exercise Prescription - 06/13/17 1600      Date of Initial Exercise RX and Referring Provider   Date 06/13/17  Referring Provider Delrae Rend MD     Treadmill   MPH 2.5   Grade 2   Minutes 10   METs 3     Bike   Level 0.7   Minutes 10   METs 2.8     NuStep   Level 2   SPM 85   Minutes 10   METs 2.7     Prescription Details   Frequency (times per week) 3   Duration Progress to 45 minutes of aerobic exercise without signs/symptoms of physical distress     Intensity   THRR 40-80% of Max Heartrate 67-134   Ratings of Perceived Exertion 11-13   Perceived Dyspnea 0-4     Progression   Progression Continue to progress workloads to maintain intensity without signs/symptoms of physical distress.     Resistance Training   Training Prescription Yes   Weight 2   Reps 10-15      Perform Capillary Blood Glucose checks as needed.  Exercise Prescription Changes:     Exercise Prescription Changes    Row Name 06/24/17 1100 07/19/17 1400           Response to  Exercise   Blood Pressure (Admit) 118/82 102/68      Blood Pressure (Exercise) 118/78 108/60      Blood Pressure (Exit) 104/68 114/68      Heart Rate (Admit) 92 bpm 88 bpm      Heart Rate (Exercise) 114 bpm 116 bpm      Heart Rate (Exit) 82 bpm 87 bpm      Rating of Perceived Exertion (Exercise)  - 12      Duration Progress to 45 minutes of aerobic exercise without signs/symptoms of physical distress Progress to 45 minutes of aerobic exercise without signs/symptoms of physical distress      Intensity THRR unchanged THRR unchanged        Progression   Progression Continue to progress workloads to maintain intensity without signs/symptoms of physical distress. Continue to progress workloads to maintain intensity without signs/symptoms of physical distress.      Average METs 3 3.8        Resistance Training   Training Prescription Yes Yes      Weight 2 3      Reps 10-15 10-15        Treadmill   MPH 2.5 2.8      Grade 2 2      Minutes 10 10      METs 3 3.91        Bike   Level 0.7 1.2      Minutes 10 10      METs 2.8 4.83        NuStep   Level 2 3      SPM 85 85      Minutes 10 10      METs 2.7 2.8        Home Exercise Plan   Plans to continue exercise at  - Home (comment)      Frequency  - Add 3 additional days to program exercise sessions.         Exercise Comments:     Exercise Comments    Row Name 06/24/17 1142 07/19/17 1444         Exercise Comments Pt is off to a great start with exercse Reviewed goals with pt.          Exercise Goals and Review:     Exercise Goals  Row Name 06/13/17 1456             Exercise Goals   Increase Physical Activity Yes       Intervention Develop an individualized exercise prescription for aerobic and resistive training based on initial evaluation findings, risk stratification, comorbidities and participant's personal goals.;Provide advice, education, support and counseling about physical activity/exercise needs.        Expected Outcomes Achievement of increased cardiorespiratory fitness and enhanced flexibility, muscular endurance and strength shown through measurements of functional capacity and personal statement of participant.       Increase Strength and Stamina Yes       Intervention Provide advice, education, support and counseling about physical activity/exercise needs.;Develop an individualized exercise prescription for aerobic and resistive training based on initial evaluation findings, risk stratification, comorbidities and participant's personal goals.       Expected Outcomes Achievement of increased cardiorespiratory fitness and enhanced flexibility, muscular endurance and strength shown through measurements of functional capacity and personal statement of participant.          Exercise Goals Re-Evaluation :     Exercise Goals Re-Evaluation    Row Name 07/19/17 1443             Exercise Goal Re-Evaluation   Exercise Goals Review Increase Physical Activity;Increase Strenth and Stamina       Comments Pt is doing well with exercise and tolerating workload increases well.       Expected Outcomes Continue with exercise Rx and gradually increase workload in order to increase cardiorespiratory fitness           Discharge Exercise Prescription (Final Exercise Prescription Changes):     Exercise Prescription Changes - 07/19/17 1400      Response to Exercise   Blood Pressure (Admit) 102/68   Blood Pressure (Exercise) 108/60   Blood Pressure (Exit) 114/68   Heart Rate (Admit) 88 bpm   Heart Rate (Exercise) 116 bpm   Heart Rate (Exit) 87 bpm   Rating of Perceived Exertion (Exercise) 12   Duration Progress to 45 minutes of aerobic exercise without signs/symptoms of physical distress   Intensity THRR unchanged     Progression   Progression Continue to progress workloads to maintain intensity without signs/symptoms of physical distress.   Average METs 3.8     Resistance Training    Training Prescription Yes   Weight 3   Reps 10-15     Treadmill   MPH 2.8   Grade 2   Minutes 10   METs 3.91     Bike   Level 1.2   Minutes 10   METs 4.83     NuStep   Level 3   SPM 85   Minutes 10   METs 2.8     Home Exercise Plan   Plans to continue exercise at Home (comment)   Frequency Add 3 additional days to program exercise sessions.      Nutrition:  Target Goals: Understanding of nutrition guidelines, daily intake of sodium 1500mg , cholesterol 200mg , calories 30% from fat and 7% or less from saturated fats, daily to have 5 or more servings of fruits and vegetables.  Biometrics:     Pre Biometrics - 06/13/17 1639      Pre Biometrics   Waist Circumference 32.5 inches   Hip Circumference 38 inches   Waist to Hip Ratio 0.86 %   Triceps Skinfold 36 mm   % Body Fat 36.3 %   Grip Strength 31 kg  Flexibility 15.5 in   Single Leg Stand 15.4 seconds       Nutrition Therapy Plan and Nutrition Goals:     Nutrition Therapy & Goals - 06/14/17 0922      Nutrition Therapy   Diet Therapeutic Lifestyle Changes     Personal Nutrition Goals   Nutrition Goal Continue working toward eating "cleaner" foods   Personal Goal #2 Pt to identify food quantities necessary to achieve weight loss of 5 lb at graduation from cardiac rehab.      Intervention Plan   Intervention Prescribe, educate and counsel regarding individualized specific dietary modifications aiming towards targeted core components such as weight, hypertension, lipid management, diabetes, heart failure and other comorbidities.   Expected Outcomes Short Term Goal: Understand basic principles of dietary content, such as calories, fat, sodium, cholesterol and nutrients.;Long Term Goal: Adherence to prescribed nutrition plan.      Nutrition Discharge: Nutrition Scores:     Nutrition Assessments - 06/14/17 0922      MEDFICTS Scores   Pre Score 37      Nutrition Goals Re-Evaluation:     Nutrition  Goals Re-Evaluation    Row Name 07/15/17 1535             Goals   Nutrition Goal Continue working toward eating "cleaner" foods       Comment Pt continues working toward goal and is please with her progress. Per discussion, pt has started eating cashew yogurt. Added sugar in foods discussed.          Personal Goal #2 Re-Evaluation   Personal Goal #2 Pt to identify food quantities necessary to achieve weight loss of 5 lb at graduation from cardiac rehab.           Nutrition Goals Re-Evaluation:     Nutrition Goals Re-Evaluation    Row Name 07/15/17 1535             Goals   Nutrition Goal Continue working toward eating "cleaner" foods       Comment Pt continues working toward goal and is please with her progress. Per discussion, pt has started eating cashew yogurt. Added sugar in foods discussed.          Personal Goal #2 Re-Evaluation   Personal Goal #2 Pt to identify food quantities necessary to achieve weight loss of 5 lb at graduation from cardiac rehab.           Nutrition Goals Discharge (Final Nutrition Goals Re-Evaluation):     Nutrition Goals Re-Evaluation - 07/15/17 1535      Goals   Nutrition Goal Continue working toward eating "cleaner" foods   Comment Pt continues working toward goal and is please with her progress. Per discussion, pt has started eating cashew yogurt. Added sugar in foods discussed.      Personal Goal #2 Re-Evaluation   Personal Goal #2 Pt to identify food quantities necessary to achieve weight loss of 5 lb at graduation from cardiac rehab.       Psychosocial: Target Goals: Acknowledge presence or absence of significant depression and/or stress, maximize coping skills, provide positive support system. Participant is able to verbalize types and ability to use techniques and skills needed for reducing stress and depression.  Initial Review & Psychosocial Screening:     Initial Psych Review & Screening - 06/13/17 1705      Initial  Review   Current issues with Current Stress Concerns;Current Anxiety/Panic   Source of Stress Concerns Chronic Illness;Unable to  participate in former interests or hobbies     Family Dynamics   Good Support System? Yes     Barriers   Psychosocial barriers to participate in program The patient should benefit from training in stress management and relaxation.     Screening Interventions   Interventions To provide support and resources with identified psychosocial needs  pt sees a counselor      Quality of Life Scores:     Quality of Life - 06/13/17 1640      Quality of Life Scores   Health/Function Pre (P)  13.29 %   Socioeconomic Pre (P)  26.79 %   Psych/Spiritual Pre (P)  17.42 %   Family Pre (P)  27.6 %      PHQ-9: Recent Review Flowsheet Data    Depression screen [facility] 2/9 06/17/2017   Decreased Interest 1   Down, Depressed, Hopeless 1   PHQ - 2 Score 2   Altered sleeping 2   Tired, decreased energy 0   Change in appetite 0   Feeling bad or failure about yourself  0   Trouble concentrating 0   Moving slowly or fidgety/restless 0   Suicidal thoughts 0   PHQ-9 Score 4   Difficult doing work/chores Not difficult at all     Interpretation of Total Score  Total Score Depression Severity:  1-4 = Minimal depression, 5-9 = Mild depression, 10-14 = Moderate depression, 15-19 = Moderately severe depression, 20-27 = Severe depression   Psychosocial Evaluation and Intervention:   Psychosocial Re-Evaluation:     Psychosocial Re-Evaluation    Row Name 06/25/17 1713 07/25/17 1223           Psychosocial Re-Evaluation   Current issues with Current Stress Concerns Current Stress Concerns      Comments Kayla Archer reports feeling better since she has been partcipating in phase 2 cardiac rehab.  -      Interventions Stress management education Stress management education;Encouraged to attend Cardiac Rehabilitation for the exercise      Continue Psychosocial Services   - Follow  up required by staff        Initial Review   Source of Stress Concerns Chronic Illness;Unable to participate in former interests or hobbies Chronic Illness  Kayla Archer feels better but continues to be concerned about her health         Psychosocial Discharge (Final Psychosocial Re-Evaluation):     Psychosocial Re-Evaluation - 07/25/17 1223      Psychosocial Re-Evaluation   Current issues with Current Stress Concerns   Interventions Stress management education;Encouraged to attend Cardiac Rehabilitation for the exercise   Continue Psychosocial Services  Follow up required by staff     Initial Review   Source of Stress Concerns Chronic Illness  Kayla Archer feels better but continues to be concerned about her health      Vocational Rehabilitation: Provide vocational rehab assistance to qualifying candidates.   Vocational Rehab Evaluation & Intervention:   Education: Education Goals: Education classes will be provided on a weekly basis, covering required topics. Participant will state understanding/return demonstration of topics presented.  Learning Barriers/Preferences:     Learning Barriers/Preferences - 06/13/17 1455      Learning Barriers/Preferences   Learning Barriers None   Learning Preferences Verbal Instruction;Skilled Demonstration;Audio      Education Topics: Count Your Pulse:  -Group instruction provided by verbal instruction, demonstration, patient participation and written materials to support subject.  Instructors address importance of being able to find your pulse and  how to count your pulse when at home without a heart monitor.  Patients get hands on experience counting their pulse with staff help and individually.   Heart Attack, Angina, and Risk Factor Modification:  -Group instruction provided by verbal instruction, video, and written materials to support subject.  Instructors address signs and symptoms of angina and heart attacks.    Also discuss risk  factors for heart disease and how to make changes to improve heart health risk factors.   Functional Fitness:  -Group instruction provided by verbal instruction, demonstration, patient participation, and written materials to support subject.  Instructors address safety measures for doing things around the house.  Discuss how to get up and down off the floor, how to pick things up properly, how to safely get out of a chair without assistance, and balance training.   Meditation and Mindfulness:  -Group instruction provided by verbal instruction, patient participation, and written materials to support subject.  Instructor addresses importance of mindfulness and meditation practice to help reduce stress and improve awareness.  Instructor also leads participants through a meditation exercise.    CARDIAC REHAB PHASE II EXERCISE from 07/24/2017 in [facility] CARDIAC REHAB  Date  07/24/17  Instruction Review Code  2- meets goals/outcomes      Stretching for Flexibility and Mobility:  -Group instruction provided by verbal instruction, patient participation, and written materials to support subject.  Instructors lead participants through series of stretches that are designed to increase flexibility thus improving mobility.  These stretches are additional exercise for major muscle groups that are typically performed during regular warm up and cool down.   Hands Only CPR:  -Group verbal, video, and participation provides a basic overview of AHA guidelines for community CPR. Role-play of emergencies allow participants the opportunity to practice calling for help and chest compression technique with discussion of AED use.   Hypertension: -Group verbal and written instruction that provides a basic overview of hypertension including the most recent diagnostic guidelines, risk factor reduction with self-care instructions and medication management.    Nutrition I class: Heart Healthy  Eating:  -Group instruction provided by PowerPoint slides, verbal discussion, and written materials to support subject matter. The instructor gives an explanation and review of the Therapeutic Lifestyle Changes diet recommendations, which includes a discussion on lipid goals, dietary fat, sodium, fiber, plant stanol/sterol esters, sugar, and the components of a well-balanced, healthy diet.   Nutrition II class: Lifestyle Skills:  -Group instruction provided by PowerPoint slides, verbal discussion, and written materials to support subject matter. The instructor gives an explanation and review of label reading, grocery shopping for heart health, heart healthy recipe modifications, and ways to make healthier choices when eating out.   Diabetes Question & Answer:  -Group instruction provided by PowerPoint slides, verbal discussion, and written materials to support subject matter. The instructor gives an explanation and review of diabetes co-morbidities, pre- and post-prandial blood glucose goals, pre-exercise blood glucose goals, signs, symptoms, and treatment of hypoglycemia and hyperglycemia, and foot care basics.   Diabetes Blitz:  -Group instruction provided by PowerPoint slides, verbal discussion, and written materials to support subject matter. The instructor gives an explanation and review of the physiology behind type 1 and type 2 diabetes, diabetes medications and rational behind using different medications, pre- and post-prandial blood glucose recommendations and Hemoglobin A1c goals, diabetes diet, and exercise including blood glucose guidelines for exercising safely.    Portion Distortion:  -Group instruction provided by PowerPoint slides, verbal discussion,  written materials, and food models to support subject matter. The instructor gives an explanation of serving size versus portion size, changes in portions sizes over the last 20 years, and what consists of a serving from each food  group.   Stress Management:  -Group instruction provided by verbal instruction, video, and written materials to support subject matter.  Instructors review role of stress in heart disease and how to cope with stress positively.     Exercising on Your Own:  -Group instruction provided by verbal instruction, power point, and written materials to support subject.  Instructors discuss benefits of exercise, components of exercise, frequency and intensity of exercise, and end points for exercise.  Also discuss use of nitroglycerin and activating EMS.  Review options of places to exercise outside of rehab.  Review guidelines for sex with heart disease.   Cardiac Drugs I:  -Group instruction provided by verbal instruction and written materials to support subject.  Instructor reviews cardiac drug classes: antiplatelets, anticoagulants, beta blockers, and statins.  Instructor discusses reasons, side effects, and lifestyle considerations for each drug class.   Cardiac Drugs II:  -Group instruction provided by verbal instruction and written materials to support subject.  Instructor reviews cardiac drug classes: angiotensin converting enzyme inhibitors (ACE-I), angiotensin II receptor blockers (ARBs), nitrates, and calcium channel blockers.  Instructor discusses reasons, side effects, and lifestyle considerations for each drug class.   CARDIAC REHAB PHASE II EXERCISE from 07/24/2017 in [facility] CARDIAC REHAB  Date  06/19/17  Instruction Review Code  2- meets goals/outcomes      Anatomy and Physiology of the Circulatory System:  Group verbal and written instruction and models provide basic cardiac anatomy and physiology, with the coronary electrical and arterial systems. Review of: AMI, Angina, Valve disease, Heart Failure, Peripheral Artery Disease, Cardiac Arrhythmia, Pacemakers, and the ICD.   Other Education:  -Group or individual verbal, written, or video instructions that  support the educational goals of the cardiac rehab program.   Knowledge Questionnaire Score:     Knowledge Questionnaire Score - 06/13/17 1631      Knowledge Questionnaire Score   Pre Score 21/24      Core Components/Risk Factors/Patient Goals at Admission:     Personal Goals and Risk Factors at Admission - 06/13/17 1640      Core Components/Risk Factors/Patient Goals on Admission   Stress Yes   Intervention Offer individual and/or small group education and counseling on adjustment to heart disease, stress management and health-related lifestyle change. Teach and support self-help strategies.;Refer participants experiencing significant psychosocial distress to appropriate mental health specialists for further evaluation and treatment. When possible, include family members and significant others in education/counseling sessions.   Expected Outcomes Short Term: Participant demonstrates changes in health-related behavior, relaxation and other stress management skills, ability to obtain effective social support, and compliance with psychotropic medications if prescribed.;Long Term: Emotional wellbeing is indicated by absence of clinically significant psychosocial distress or social isolation.      Core Components/Risk Factors/Patient Goals Review:    Core Components/Risk Factors/Patient Goals at Discharge (Final Review):    ITP Comments:     ITP Comments    Row Name 06/13/17 1456           ITP Comments Dr. Armanda Magic, Medical Director          Comments: Blakelyn is making expected progress toward personal goals after completing 14 sessions. Recommend continued exercise and life style modification education including  stress management and relaxation  techniques to decrease cardiac risk profile. Kayla Archer is doing well with exercise but remains concerned about the left neck area. Kayla Archer is going to [facility] next week to have her left neck area  evaluated and plans to  return to exercise next Friday.Kayla Lighter, RN,BSN 07/25/2017 12:29 PM

## 2017-07-26 ENCOUNTER — Encounter (HOSPITAL_COMMUNITY)
Admission: RE | Admit: 2017-07-26 | Discharge: 2017-07-26 | Disposition: A | Discharge status: Home / Self Care | Payer: BLUE CROSS/BLUE SHIELD | Source: Ambulatory Visit | Attending: Cardiology | Admitting: Cardiology

## 2017-07-26 DIAGNOSIS — Z952 Presence of prosthetic heart valve: Secondary | ICD-10-CM | POA: Diagnosis not present

## 2017-07-26 DIAGNOSIS — Z9889 Other specified postprocedural states: Secondary | ICD-10-CM

## 2017-07-29 ENCOUNTER — Encounter (HOSPITAL_COMMUNITY): Payer: BLUE CROSS/BLUE SHIELD

## 2017-07-29 DIAGNOSIS — I480 Paroxysmal atrial fibrillation: Secondary | ICD-10-CM | POA: Diagnosis not present

## 2017-07-29 DIAGNOSIS — I442 Atrioventricular block, complete: Secondary | ICD-10-CM | POA: Diagnosis not present

## 2017-07-29 DIAGNOSIS — Q211 Atrial septal defect: Secondary | ICD-10-CM | POA: Diagnosis not present

## 2017-07-29 DIAGNOSIS — Z950 Presence of cardiac pacemaker: Secondary | ICD-10-CM | POA: Diagnosis not present

## 2017-07-29 DIAGNOSIS — I4892 Unspecified atrial flutter: Secondary | ICD-10-CM | POA: Diagnosis not present

## 2017-07-29 DIAGNOSIS — E041 Nontoxic single thyroid nodule: Secondary | ICD-10-CM | POA: Diagnosis not present

## 2017-07-29 DIAGNOSIS — R9431 Abnormal electrocardiogram [ECG] [EKG]: Secondary | ICD-10-CM | POA: Diagnosis not present

## 2017-07-29 DIAGNOSIS — Q231 Congenital insufficiency of aortic valve: Secondary | ICD-10-CM | POA: Diagnosis not present

## 2017-07-29 DIAGNOSIS — I999 Unspecified disorder of circulatory system: Secondary | ICD-10-CM | POA: Diagnosis not present

## 2017-07-29 DIAGNOSIS — M7989 Other specified soft tissue disorders: Secondary | ICD-10-CM | POA: Diagnosis not present

## 2017-07-29 DIAGNOSIS — Z952 Presence of prosthetic heart valve: Secondary | ICD-10-CM | POA: Diagnosis not present

## 2017-07-29 DIAGNOSIS — R221 Localized swelling, mass and lump, neck: Secondary | ICD-10-CM | POA: Diagnosis not present

## 2017-07-30 NOTE — Progress Notes (Signed)
QUALITY OF LIFE SCORE REVIEW  Pt completed Quality of Life survey as a participant in Cardiac Rehab. Scores 21.0 or below are considered low. Pt score very low in several areas Overall 19.25, Health and Function 13.29, socioeconomic 26.79 , physiological and spiritual 17.42, family 27.60. Patient quality of life slightly altered by physical constraints which limits ability to perform as prior to recent cardiac illness. Kayla Archer has been concerned about her health since her AVR and pacer implantation in May at [facility] . Offered emotional support and reassurance.  Will continue to monitor and intervene as necessary.  Kayla Archer went to follow up at [facility] on Monday, 07/29/2017. Kayla Archer reports that she has felt better since she has been participating in phase 2 cardiac rehab.Will fax exercise flow sheets to Dr. Verl Dicker  office for review. Kayla Archer denies feeling depressed. I offered to set up an appointment for Kayla Archer to meet with the hospital chaplain. Kayla Archer declined at this time. Kayla Archer says she has good family support at home.Gladstone Lighter, RN,BSN 07/30/2017 2:57 PM

## 2017-07-31 ENCOUNTER — Encounter (HOSPITAL_COMMUNITY)
Admission: RE | Admit: 2017-07-31 | Discharge: 2017-07-31 | Disposition: A | Discharge status: Home / Self Care | Payer: BLUE CROSS/BLUE SHIELD | Source: Ambulatory Visit | Attending: Cardiology | Admitting: Cardiology

## 2017-08-02 ENCOUNTER — Encounter (HOSPITAL_COMMUNITY): Payer: BLUE CROSS/BLUE SHIELD

## 2017-08-05 ENCOUNTER — Encounter (HOSPITAL_COMMUNITY)
Admission: RE | Admit: 2017-08-05 | Discharge: 2017-08-05 | Disposition: A | Discharge status: Home / Self Care | Payer: BLUE CROSS/BLUE SHIELD | Source: Ambulatory Visit | Attending: Cardiology | Admitting: Cardiology

## 2017-08-05 DIAGNOSIS — Z952 Presence of prosthetic heart valve: Secondary | ICD-10-CM | POA: Diagnosis not present

## 2017-08-05 DIAGNOSIS — Z9889 Other specified postprocedural states: Secondary | ICD-10-CM

## 2017-08-05 NOTE — Progress Notes (Signed)
Reviewed home exercise guidelines with patient including endpoints, temperature precautions, target heart rate and rate of perceived exertion. Pt walks 1 mile daily and participates in yoga 3 days per week as her mode of home exercise. Pt voices understanding of instructions given. Artist Pais, MS, ACSM CEP

## 2017-08-07 ENCOUNTER — Encounter (HOSPITAL_COMMUNITY)
Admission: RE | Admit: 2017-08-07 | Discharge: 2017-08-07 | Disposition: A | Discharge status: Home / Self Care | Payer: BLUE CROSS/BLUE SHIELD | Source: Ambulatory Visit | Attending: Cardiology | Admitting: Cardiology

## 2017-08-07 DIAGNOSIS — Z9889 Other specified postprocedural states: Secondary | ICD-10-CM

## 2017-08-07 DIAGNOSIS — Z952 Presence of prosthetic heart valve: Secondary | ICD-10-CM | POA: Diagnosis not present

## 2017-08-09 ENCOUNTER — Encounter (HOSPITAL_COMMUNITY): Admission: RE | Admit: 2017-08-09 | Payer: BLUE CROSS/BLUE SHIELD | Source: Ambulatory Visit

## 2017-08-14 ENCOUNTER — Encounter (HOSPITAL_COMMUNITY)
Admission: RE | Admit: 2017-08-14 | Discharge: 2017-08-14 | Disposition: A | Discharge status: Home / Self Care | Payer: BLUE CROSS/BLUE SHIELD | Source: Ambulatory Visit | Attending: Cardiology | Admitting: Cardiology

## 2017-08-14 ENCOUNTER — Encounter (HOSPITAL_COMMUNITY): Payer: BLUE CROSS/BLUE SHIELD

## 2017-08-14 DIAGNOSIS — Z952 Presence of prosthetic heart valve: Secondary | ICD-10-CM | POA: Insufficient documentation

## 2017-08-14 DIAGNOSIS — Z9889 Other specified postprocedural states: Secondary | ICD-10-CM

## 2017-08-16 ENCOUNTER — Encounter (HOSPITAL_COMMUNITY): Payer: BLUE CROSS/BLUE SHIELD

## 2017-08-16 ENCOUNTER — Encounter (HOSPITAL_COMMUNITY)
Admission: RE | Admit: 2017-08-16 | Discharge: 2017-08-16 | Disposition: A | Discharge status: Home / Self Care | Payer: BLUE CROSS/BLUE SHIELD | Source: Ambulatory Visit | Attending: Cardiology | Admitting: Cardiology

## 2017-08-16 DIAGNOSIS — Z9889 Other specified postprocedural states: Secondary | ICD-10-CM

## 2017-08-16 DIAGNOSIS — Z952 Presence of prosthetic heart valve: Secondary | ICD-10-CM | POA: Diagnosis not present

## 2017-08-19 ENCOUNTER — Encounter (HOSPITAL_COMMUNITY): Payer: BLUE CROSS/BLUE SHIELD

## 2017-08-20 NOTE — Progress Notes (Signed)
Cardiac Individual Treatment Plan  Patient Details  Name: Kayla Archer MRN: [id number] Date of Birth: [birth year] Referring Provider:     CARDIAC REHAB PHASE II ORIENTATION from 06/13/2017 in [facility] CARDIAC REHAB  Referring Provider  Delrae Rend MD      Initial Encounter Date:    CARDIAC REHAB PHASE II ORIENTATION from 06/13/2017 in [facility] CARDIAC REHAB  Date  06/13/17  Referring Provider  Delrae Rend MD      Visit Diagnosis: S/P heart valve repair  Patient's Home Medications on Admission:  Current Outpatient Prescriptions:  .  ALPRAZolam (XANAX) 0.25 MG tablet, Take 1 tablet (0.25 mg total) by mouth at bedtime as needed for anxiety., Disp: 30 tablet, Rfl: 0 .  aspirin EC 81 MG tablet, Take 81 mg by mouth 2 (two) times daily., Disp: , Rfl:  .  ibuprofen (MOTRIN IB) 200 MG tablet, Take 600 mg by mouth every 6 (six) hours as needed for moderate pain., Disp: , Rfl:  .  metoprolol tartrate (LOPRESSOR) 25 MG tablet, Take 25 mg by mouth daily. , Disp: , Rfl:   Past Medical History: Past Medical History:  Diagnosis Date  . Cartilage tear    rt knee  . Heart murmur   . Medical history non-contributory    see valve rep-  . PONV (postoperative nausea and vomiting)    plus hypotension    Tobacco Use: History  Smoking Status  . Never Smoker  Smokeless Tobacco  . Never Used    Labs: Recent Review Flowsheet Data    There is no flowsheet data to display.      Capillary Blood Glucose: No results found for: GLUCAP   Exercise Target Goals:    Exercise Program Goal: Individual exercise prescription set with THRR, safety & activity barriers. Participant demonstrates ability to understand and report RPE using BORG scale, to self-measure pulse accurately, and to acknowledge the importance of the exercise prescription.  Exercise Prescription Goal: Starting with aerobic activity 30 plus minutes a day, 3 days per week for  initial exercise prescription. Provide home exercise prescription and guidelines that participant acknowledges understanding prior to discharge.  Activity Barriers & Risk Stratification:     Activity Barriers & Cardiac Risk Stratification - 06/13/17 1455      Activity Barriers & Cardiac Risk Stratification   Activity Barriers None   Cardiac Risk Stratification Moderate      6 Minute Walk:     6 Minute Walk    Row Name 06/13/17 1632         6 Minute Walk   Phase Initial     Distance 1220 feet     Walk Time 6 minutes     # of Rest Breaks 0     MPH 2.3     METS 4     RPE 12     VO2 Peak 14     Symptoms No     Resting HR 80 bpm     Resting BP 101/65     Max Ex. HR 125 bpm     Max Ex. BP 119/79     2 Minute Post BP 110/70        Oxygen Initial Assessment:   Oxygen Re-Evaluation:   Oxygen Discharge (Final Oxygen Re-Evaluation):   Initial Exercise Prescription:     Initial Exercise Prescription - 06/13/17 1600      Date of Initial Exercise RX and Referring Provider   Date 06/13/17  Referring Provider Delrae Rend MD     Treadmill   MPH 2.5   Grade 2   Minutes 10   METs 3     Bike   Level 0.7   Minutes 10   METs 2.8     NuStep   Level 2   SPM 85   Minutes 10   METs 2.7     Prescription Details   Frequency (times per week) 3   Duration Progress to 45 minutes of aerobic exercise without signs/symptoms of physical distress     Intensity   THRR 40-80% of Max Heartrate 67-134   Ratings of Perceived Exertion 11-13   Perceived Dyspnea 0-4     Progression   Progression Continue to progress workloads to maintain intensity without signs/symptoms of physical distress.     Resistance Training   Training Prescription Yes   Weight 2   Reps 10-15      Perform Capillary Blood Glucose checks as needed.  Exercise Prescription Changes:     Exercise Prescription Changes    Row Name 06/24/17 1100 07/19/17 1400 08/05/17 1600 08/16/17 1700        Response to Exercise   Blood Pressure (Admit) 118/82 102/68 113/71 100/76    Blood Pressure (Exercise) 118/78 108/60 112/70 144/76    Blood Pressure (Exit) 104/68 114/68 98/62 102/62    Heart Rate (Admit) 92 bpm 88 bpm 75 bpm 92 bpm    Heart Rate (Exercise) 114 bpm 116 bpm 114 bpm 138 bpm    Heart Rate (Exit) 82 bpm 87 bpm 75 bpm 91 bpm    Rating of Perceived Exertion (Exercise)  - Symptoms  -  - none none    Duration Progress to 45 minutes of aerobic exercise without signs/symptoms of physical distress Progress to 45 minutes of aerobic exercise without signs/symptoms of physical distress Progress to 45 minutes of aerobic exercise without signs/symptoms of physical distress Progress to 45 minutes of aerobic exercise without signs/symptoms of physical distress    Intensity THRR unchanged THRR unchanged THRR unchanged THRR unchanged      Progression   Progression Continue to progress workloads to maintain intensity without signs/symptoms of physical distress. Continue to progress workloads to maintain intensity without signs/symptoms of physical distress. Continue to progress workloads to maintain intensity without signs/symptoms of physical distress. Continue to progress workloads to maintain intensity without signs/symptoms of physical distress.    Average METs 3 3.8 4 4.1      Resistance Training   Training Prescription Yes Yes Yes Yes    Weight Reps 10-15 10-15 10-15 10-15      Interval Training   Interval Training  -  - No No      Treadmill   MPH 2.5 2.8 3 3     Grade Minutes METs 3 3.91 4.54 4.54      Bike   Level 0.7 1.2 -  -    Minutes 10 10 -  -    METs 2.8 4.83 -  -      Recumbant Bike   Level  -  - 3 3.5    Minutes  -  - 10 10    METs  -  - 3.3 3.3      NuStep   Level SPM 85 85 90 90  Minutes METs 2.7 2.8 4.1 3.7      Home Exercise Plan   Plans to continue exercise at  - Home  (comment) Home (comment)  Patient is doing yoga 3 days per week and walking daily. Home (comment)  Patient is doing yoga 3 days per week and walking daily.    Frequency  - Add 3 additional days to program exercise sessions. Add 3 additional days to program exercise sessions. Add 3 additional days to program exercise sessions.    Initial Home Exercises Provided  -  - 08/05/17 08/05/17       Exercise Comments:     Exercise Comments    Row Name 06/24/17 1142 07/19/17 1444 08/05/17 1649 08/16/17 1736     Exercise Comments Pt is off to a great start with exercse Reviewed goals with pt.  Reviewed home exercise guidelines with pt. Pt moved from upright stationary bike to recumbent bike. Reviewed METS and goals.       Exercise Goals and Review:     Exercise Goals    Row Name 06/13/17 1456             Exercise Goals   Increase Physical Activity Yes       Intervention Develop an individualized exercise prescription for aerobic and resistive training based on initial evaluation findings, risk stratification, comorbidities and participant's personal goals.;Provide advice, education, support and counseling about physical activity/exercise needs.       Expected Outcomes Achievement of increased cardiorespiratory fitness and enhanced flexibility, muscular endurance and strength shown through measurements of functional capacity and personal statement of participant.       Increase Strength and Stamina Yes       Intervention Provide advice, education, support and counseling about physical activity/exercise needs.;Develop an individualized exercise prescription for aerobic and resistive training based on initial evaluation findings, risk stratification, comorbidities and participant's personal goals.       Expected Outcomes Achievement of increased cardiorespiratory fitness and enhanced flexibility, muscular endurance and strength shown through measurements of functional capacity and personal  statement of participant.          Exercise Goals Re-Evaluation :     Exercise Goals Re-Evaluation    Row Name 07/19/17 1443 08/16/17 1737           Exercise Goal Re-Evaluation   Exercise Goals Review Increase Physical Activity;Increase Strenth and Stamina Increase Physical Activity;Increase Strength and Stamina      Comments Pt is doing well with exercise and tolerating workload increases well. Pt states she feels his fitness level is getting better.      Expected Outcomes Continue with exercise Rx and gradually increase workload in order to increase cardiorespiratory fitness Increase workloads as tolerated.          Discharge Exercise Prescription (Final Exercise Prescription Changes):     Exercise Prescription Changes - 08/16/17 1700      Response to Exercise   Blood Pressure (Admit) 100/76   Blood Pressure (Exercise) 144/76   Blood Pressure (Exit) 102/62   Heart Rate (Admit) 92 bpm   Heart Rate (Exercise) 138 bpm   Heart Rate (Exit) 91 bpm   Rating of Perceived Exertion (Exercise) 12   Symptoms none   Duration Progress to 45 minutes of aerobic exercise without signs/symptoms of physical distress   Intensity THRR unchanged     Progression   Progression Continue to progress workloads to maintain intensity without signs/symptoms of physical distress.  Average METs 4.1     Resistance Training   Training Prescription Yes   Weight 4   Reps 10-15     Interval Training   Interval Training No     Treadmill   MPH 3   Grade 3   Minutes 10   METs 4.54     Recumbant Bike   Level 3.5   Minutes 10   METs 3.3     NuStep   Level 5   SPM 90   Minutes 10   METs 3.7     Home Exercise Plan   Plans to continue exercise at Home (comment)  Patient is doing yoga 3 days per week and walking daily.   Frequency Add 3 additional days to program exercise sessions.   Initial Home Exercises Provided 08/05/17      Nutrition:  Target Goals: Understanding of nutrition  guidelines, daily intake of sodium 1500mg , cholesterol 200mg , calories 30% from fat and 7% or less from saturated fats, daily to have 5 or more servings of fruits and vegetables.  Biometrics:     Pre Biometrics - 06/13/17 1639      Pre Biometrics   Waist Circumference 32.5 inches   Hip Circumference 38 inches   Waist to Hip Ratio 0.86 %   Triceps Skinfold 36 mm   % Body Fat 36.3 %   Grip Strength 31 kg   Flexibility 15.5 in   Single Leg Stand 15.4 seconds       Nutrition Therapy Plan and Nutrition Goals:     Nutrition Therapy & Goals - 06/14/17 0922      Nutrition Therapy   Diet Therapeutic Lifestyle Changes     Personal Nutrition Goals   Nutrition Goal Continue working toward eating "cleaner" foods   Personal Goal #2 Pt to identify food quantities necessary to achieve weight loss of 5 lb at graduation from cardiac rehab.      Intervention Plan   Intervention Prescribe, educate and counsel regarding individualized specific dietary modifications aiming towards targeted core components such as weight, hypertension, lipid management, diabetes, heart failure and other comorbidities.   Expected Outcomes Short Term Goal: Understand basic principles of dietary content, such as calories, fat, sodium, cholesterol and nutrients.;Long Term Goal: Adherence to prescribed nutrition plan.      Nutrition Discharge: Nutrition Scores:     Nutrition Assessments - 06/14/17 0922      MEDFICTS Scores   Pre Score 37      Nutrition Goals Re-Evaluation:     Nutrition Goals Re-Evaluation    Row Name 07/15/17 1535             Goals   Nutrition Goal Continue working toward eating "cleaner" foods       Comment Pt continues working toward goal and is please with her progress. Per discussion, pt has started eating cashew yogurt. Added sugar in foods discussed.          Personal Goal #2 Re-Evaluation   Personal Goal #2 Pt to identify food quantities necessary to achieve weight loss  of 5 lb at graduation from cardiac rehab.           Nutrition Goals Re-Evaluation:     Nutrition Goals Re-Evaluation    Row Name 07/15/17 1535             Goals   Nutrition Goal Continue working toward eating "cleaner" foods       Comment Pt continues working toward goal and is please with  her progress. Per discussion, pt has started eating cashew yogurt. Added sugar in foods discussed.          Personal Goal #2 Re-Evaluation   Personal Goal #2 Pt to identify food quantities necessary to achieve weight loss of 5 lb at graduation from cardiac rehab.           Nutrition Goals Discharge (Final Nutrition Goals Re-Evaluation):     Nutrition Goals Re-Evaluation - 07/15/17 1535      Goals   Nutrition Goal Continue working toward eating "cleaner" foods   Comment Pt continues working toward goal and is please with her progress. Per discussion, pt has started eating cashew yogurt. Added sugar in foods discussed.      Personal Goal #2 Re-Evaluation   Personal Goal #2 Pt to identify food quantities necessary to achieve weight loss of 5 lb at graduation from cardiac rehab.       Psychosocial: Target Goals: Acknowledge presence or absence of significant depression and/or stress, maximize coping skills, provide positive support system. Participant is able to verbalize types and ability to use techniques and skills needed for reducing stress and depression.  Initial Review & Psychosocial Screening:     Initial Psych Review & Screening - 06/13/17 1705      Initial Review   Current issues with Current Stress Concerns;Current Anxiety/Panic   Source of Stress Concerns Chronic Illness;Unable to participate in former interests or hobbies     Family Dynamics   Good Support System? Yes     Barriers   Psychosocial barriers to participate in program The patient should benefit from training in stress management and relaxation.     Screening Interventions   Interventions To provide  support and resources with identified psychosocial needs  pt sees a counselor      Quality of Life Scores:     Quality of Life - 06/13/17 1640      Quality of Life Scores   Health/Function Pre (P)  13.29 %   Socioeconomic Pre (P)  26.79 %   Psych/Spiritual Pre (P)  17.42 %   Family Pre (P)  27.6 %      PHQ-9: Recent Review Flowsheet Data    Depression screen [facility] 2/9 06/17/2017   Decreased Interest 1   Down, Depressed, Hopeless 1   PHQ - 2 Score 2   Altered sleeping 2   Tired, decreased energy 0   Change in appetite 0   Feeling bad or failure about yourself  0   Trouble concentrating 0   Moving slowly or fidgety/restless 0   Suicidal thoughts 0   PHQ-9 Score 4   Difficult doing work/chores Not difficult at all     Interpretation of Total Score  Total Score Depression Severity:  1-4 = Minimal depression, 5-9 = Mild depression, 10-14 = Moderate depression, 15-19 = Moderately severe depression, 20-27 = Severe depression   Psychosocial Evaluation and Intervention:   Psychosocial Re-Evaluation:     Psychosocial Re-Evaluation    Row Name 06/25/17 1713 07/25/17 1223 08/20/17 1225         Psychosocial Re-Evaluation   Current issues with Current Stress Concerns Current Stress Concerns Current Stress Concerns     Comments Tyteanna reports feeling better since she has been partcipating in phase 2 cardiac rehab.  - Hadessah reports feeling better since she has been partcipating in phase 2 cardiac rehab.     Interventions Stress management education Stress management education;Encouraged to attend Cardiac Rehabilitation for the exercise Encouraged  to attend Cardiac Rehabilitation for the exercise;Stress management education     Continue Psychosocial Services   - Follow up required by staff Follow up required by staff       Initial Review   Source of Stress Concerns Chronic Illness;Unable to participate in former interests or hobbies Chronic Illness  Virdie feels better but  continues to be concerned about her health Family  Mtirina just found out her mother has cancer and is under hospice care        Psychosocial Discharge (Final Psychosocial Re-Evaluation):     Psychosocial Re-Evaluation - 08/20/17 1225      Psychosocial Re-Evaluation   Current issues with Current Stress Concerns   Comments Whittley reports feeling better since she has been partcipating in phase 2 cardiac rehab.   Interventions Encouraged to attend Cardiac Rehabilitation for the exercise;Stress management education   Continue Psychosocial Services  Follow up required by staff     Initial Review   Source of Stress Concerns Family  Mtirina just found out her mother has cancer and is under hospice care      Vocational Rehabilitation: Provide vocational rehab assistance to qualifying candidates.   Vocational Rehab Evaluation & Intervention:   Education: Education Goals: Education classes will be provided on a weekly basis, covering required topics. Participant will state understanding/return demonstration of topics presented.  Learning Barriers/Preferences:     Learning Barriers/Preferences - 06/13/17 1455      Learning Barriers/Preferences   Learning Barriers None   Learning Preferences Verbal Instruction;Skilled Demonstration;Audio      Education Topics: Count Your Pulse:  -Group instruction provided by verbal instruction, demonstration, patient participation and written materials to support subject.  Instructors address importance of being able to find your pulse and how to count your pulse when at home without a heart monitor.  Patients get hands on experience counting their pulse with staff help and individually.   Heart Attack, Angina, and Risk Factor Modification:  -Group instruction provided by verbal instruction, video, and written materials to support subject.  Instructors address signs and symptoms of angina and heart attacks.    Also discuss risk factors for heart  disease and how to make changes to improve heart health risk factors.   Functional Fitness:  -Group instruction provided by verbal instruction, demonstration, patient participation, and written materials to support subject.  Instructors address safety measures for doing things around the house.  Discuss how to get up and down off the floor, how to pick things up properly, how to safely get out of a chair without assistance, and balance training.   Meditation and Mindfulness:  -Group instruction provided by verbal instruction, patient participation, and written materials to support subject.  Instructor addresses importance of mindfulness and meditation practice to help reduce stress and improve awareness.  Instructor also leads participants through a meditation exercise.    CARDIAC REHAB PHASE II EXERCISE from 07/24/2017 in [facility] CARDIAC REHAB  Date  07/24/17  Instruction Review Code  2- meets goals/outcomes      Stretching for Flexibility and Mobility:  -Group instruction provided by verbal instruction, patient participation, and written materials to support subject.  Instructors lead participants through series of stretches that are designed to increase flexibility thus improving mobility.  These stretches are additional exercise for major muscle groups that are typically performed during regular warm up and cool down.   Hands Only CPR:  -Group verbal, video, and participation provides a basic overview of AHA guidelines for  community CPR. Role-play of emergencies allow participants the opportunity to practice calling for help and chest compression technique with discussion of AED use.   Hypertension: -Group verbal and written instruction that provides a basic overview of hypertension including the most recent diagnostic guidelines, risk factor reduction with self-care instructions and medication management.    Nutrition I class: Heart Healthy Eating:  -Group  instruction provided by PowerPoint slides, verbal discussion, and written materials to support subject matter. The instructor gives an explanation and review of the Therapeutic Lifestyle Changes diet recommendations, which includes a discussion on lipid goals, dietary fat, sodium, fiber, plant stanol/sterol esters, sugar, and the components of a well-balanced, healthy diet.   Nutrition II class: Lifestyle Skills:  -Group instruction provided by PowerPoint slides, verbal discussion, and written materials to support subject matter. The instructor gives an explanation and review of label reading, grocery shopping for heart health, heart healthy recipe modifications, and ways to make healthier choices when eating out.   Diabetes Question & Answer:  -Group instruction provided by PowerPoint slides, verbal discussion, and written materials to support subject matter. The instructor gives an explanation and review of diabetes co-morbidities, pre- and post-prandial blood glucose goals, pre-exercise blood glucose goals, signs, symptoms, and treatment of hypoglycemia and hyperglycemia, and foot care basics.   Diabetes Blitz:  -Group instruction provided by PowerPoint slides, verbal discussion, and written materials to support subject matter. The instructor gives an explanation and review of the physiology behind type 1 and type 2 diabetes, diabetes medications and rational behind using different medications, pre- and post-prandial blood glucose recommendations and Hemoglobin A1c goals, diabetes diet, and exercise including blood glucose guidelines for exercising safely.    Portion Distortion:  -Group instruction provided by PowerPoint slides, verbal discussion, written materials, and food models to support subject matter. The instructor gives an explanation of serving size versus portion size, changes in portions sizes over the last 20 years, and what consists of a serving from each food group.   Stress  Management:  -Group instruction provided by verbal instruction, video, and written materials to support subject matter.  Instructors review role of stress in heart disease and how to cope with stress positively.     Exercising on Your Own:  -Group instruction provided by verbal instruction, power point, and written materials to support subject.  Instructors discuss benefits of exercise, components of exercise, frequency and intensity of exercise, and end points for exercise.  Also discuss use of nitroglycerin and activating EMS.  Review options of places to exercise outside of rehab.  Review guidelines for sex with heart disease.   Cardiac Drugs I:  -Group instruction provided by verbal instruction and written materials to support subject.  Instructor reviews cardiac drug classes: antiplatelets, anticoagulants, beta blockers, and statins.  Instructor discusses reasons, side effects, and lifestyle considerations for each drug class.   Cardiac Drugs II:  -Group instruction provided by verbal instruction and written materials to support subject.  Instructor reviews cardiac drug classes: angiotensin converting enzyme inhibitors (ACE-I), angiotensin II receptor blockers (ARBs), nitrates, and calcium channel blockers.  Instructor discusses reasons, side effects, and lifestyle considerations for each drug class.   CARDIAC REHAB PHASE II EXERCISE from 07/24/2017 in [facility] CARDIAC REHAB  Date  06/19/17  Instruction Review Code  2- meets goals/outcomes      Anatomy and Physiology of the Circulatory System:  Group verbal and written instruction and models provide basic cardiac anatomy and physiology, with the coronary electrical and arterial  systems. Review of: AMI, Angina, Valve disease, Heart Failure, Peripheral Artery Disease, Cardiac Arrhythmia, Pacemakers, and the ICD.   Other Education:  -Group or individual verbal, written, or video instructions that support the educational  goals of the cardiac rehab program.   Knowledge Questionnaire Score:     Knowledge Questionnaire Score - 06/13/17 1631      Knowledge Questionnaire Score   Pre Score 21/24      Core Components/Risk Factors/Patient Goals at Admission:     Personal Goals and Risk Factors at Admission - 06/13/17 1640      Core Components/Risk Factors/Patient Goals on Admission   Stress Yes   Intervention Offer individual and/or small group education and counseling on adjustment to heart disease, stress management and health-related lifestyle change. Teach and support self-help strategies.;Refer participants experiencing significant psychosocial distress to appropriate mental health specialists for further evaluation and treatment. When possible, include family members and significant others in education/counseling sessions.   Expected Outcomes Short Term: Participant demonstrates changes in health-related behavior, relaxation and other stress management skills, ability to obtain effective social support, and compliance with psychotropic medications if prescribed.;Long Term: Emotional wellbeing is indicated by absence of clinically significant psychosocial distress or social isolation.      Core Components/Risk Factors/Patient Goals Review:      Goals and Risk Factor Review    Row Name 08/20/17 1222             Core Components/Risk Factors/Patient Goals Review   Personal Goals Review Stress       Review -  Arzella continues to have some stress. Cassandra just found out her mother has lung cancer       Expected Outcomes -  Patient will be able to manage her stressors upon completeion from cardiac rehab          Core Components/Risk Factors/Patient Goals at Discharge (Final Review):      Goals and Risk Factor Review - 08/20/17 1222      Core Components/Risk Factors/Patient Goals Review   Personal Goals Review Stress   Review --  Tarahji continues to have some stress. Brienne just found out  her mother has lung cancer   Expected Outcomes --  Patient will be able to manage her stressors upon completeion from cardiac rehab      ITP Comments:     ITP Comments    Row Name 06/13/17 1456           ITP Comments Dr. Armanda Magic, Medical Director          Comments: Khaleah is making expected progress toward personal goals after completing 17 sessions. Recommend continued exercise and life style modification education including  stress management and relaxation techniques to decrease cardiac risk profile. Sofi has been doing well with exercise at cardiac rehab and is also participating in yoga. Anastacia just found out her mother has terminal lung cancer who lives in [place].Will continue to provide emotional support as needed.Glad[place] Lighter, RN,BSN 08/20/2017 12:34 PM

## 2017-08-21 ENCOUNTER — Encounter (HOSPITAL_COMMUNITY)
Admission: RE | Admit: 2017-08-21 | Discharge: 2017-08-21 | Disposition: A | Discharge status: Home / Self Care | Payer: BLUE CROSS/BLUE SHIELD | Source: Ambulatory Visit | Attending: Cardiology | Admitting: Cardiology

## 2017-08-21 DIAGNOSIS — Z9889 Other specified postprocedural states: Secondary | ICD-10-CM

## 2017-08-23 ENCOUNTER — Encounter (HOSPITAL_COMMUNITY)
Admission: RE | Admit: 2017-08-23 | Discharge: 2017-08-23 | Disposition: A | Discharge status: Home / Self Care | Payer: BLUE CROSS/BLUE SHIELD | Source: Ambulatory Visit | Attending: Cardiology | Admitting: Cardiology

## 2017-08-23 DIAGNOSIS — Z952 Presence of prosthetic heart valve: Secondary | ICD-10-CM | POA: Diagnosis not present

## 2017-08-23 DIAGNOSIS — Z9889 Other specified postprocedural states: Secondary | ICD-10-CM

## 2017-08-26 ENCOUNTER — Encounter (HOSPITAL_COMMUNITY)
Admission: RE | Admit: 2017-08-26 | Discharge: 2017-08-26 | Disposition: A | Discharge status: Home / Self Care | Payer: BLUE CROSS/BLUE SHIELD | Source: Ambulatory Visit | Attending: Cardiology | Admitting: Cardiology

## 2017-08-26 DIAGNOSIS — Z9889 Other specified postprocedural states: Secondary | ICD-10-CM

## 2017-08-26 DIAGNOSIS — Z952 Presence of prosthetic heart valve: Secondary | ICD-10-CM | POA: Diagnosis not present

## 2017-08-28 ENCOUNTER — Encounter (HOSPITAL_COMMUNITY): Payer: BLUE CROSS/BLUE SHIELD

## 2017-08-30 ENCOUNTER — Encounter (HOSPITAL_COMMUNITY)
Admission: RE | Admit: 2017-08-30 | Discharge: 2017-08-30 | Disposition: A | Discharge status: Home / Self Care | Payer: BLUE CROSS/BLUE SHIELD | Source: Ambulatory Visit | Attending: Cardiology | Admitting: Cardiology

## 2017-08-30 DIAGNOSIS — Z952 Presence of prosthetic heart valve: Secondary | ICD-10-CM | POA: Diagnosis not present

## 2017-08-30 DIAGNOSIS — Z9889 Other specified postprocedural states: Secondary | ICD-10-CM

## 2017-09-02 ENCOUNTER — Encounter (HOSPITAL_COMMUNITY): Payer: BLUE CROSS/BLUE SHIELD

## 2017-09-04 ENCOUNTER — Encounter (HOSPITAL_COMMUNITY)
Admission: RE | Admit: 2017-09-04 | Discharge: 2017-09-04 | Disposition: A | Discharge status: Home / Self Care | Payer: BLUE CROSS/BLUE SHIELD | Source: Ambulatory Visit | Attending: Cardiology | Admitting: Cardiology

## 2017-09-04 DIAGNOSIS — Z9889 Other specified postprocedural states: Secondary | ICD-10-CM

## 2017-09-04 DIAGNOSIS — Z952 Presence of prosthetic heart valve: Secondary | ICD-10-CM | POA: Diagnosis not present

## 2017-09-06 ENCOUNTER — Encounter (HOSPITAL_COMMUNITY): Payer: BLUE CROSS/BLUE SHIELD

## 2017-09-09 ENCOUNTER — Encounter (HOSPITAL_COMMUNITY)
Admission: RE | Admit: 2017-09-09 | Discharge: 2017-09-09 | Disposition: A | Discharge status: Home / Self Care | Payer: BLUE CROSS/BLUE SHIELD | Source: Ambulatory Visit | Attending: Cardiology | Admitting: Cardiology

## 2017-09-09 VITALS — BP 120/62 | HR 80 | Wt 135.6 lb

## 2017-09-09 DIAGNOSIS — Z9889 Other specified postprocedural states: Secondary | ICD-10-CM

## 2017-09-09 DIAGNOSIS — Z952 Presence of prosthetic heart valve: Secondary | ICD-10-CM | POA: Diagnosis not present

## 2017-09-11 ENCOUNTER — Encounter (HOSPITAL_COMMUNITY): Payer: BLUE CROSS/BLUE SHIELD

## 2017-09-12 DIAGNOSIS — E041 Nontoxic single thyroid nodule: Secondary | ICD-10-CM | POA: Diagnosis not present

## 2017-09-13 ENCOUNTER — Encounter (HOSPITAL_COMMUNITY): Payer: BLUE CROSS/BLUE SHIELD

## 2017-09-16 ENCOUNTER — Encounter (HOSPITAL_COMMUNITY): Payer: BLUE CROSS/BLUE SHIELD

## 2017-09-17 NOTE — Progress Notes (Signed)
Cardiac Individual Treatment Plan  Patient Details  Name: Kayla Archer MRN: [id number] Date of Birth: [birth year] Referring Provider:     CARDIAC REHAB PHASE II ORIENTATION from 06/13/2017 in [facility] CARDIAC REHAB  Referring Provider  Delrae Rend MD      Initial Encounter Date:    CARDIAC REHAB PHASE II ORIENTATION from 06/13/2017 in [facility] CARDIAC REHAB  Date  06/13/17  Referring Provider  Delrae Rend MD      Visit Diagnosis: S/P heart valve repair  Patient's Home Medications on Admission:  Current Outpatient Prescriptions:  .  ALPRAZolam (XANAX) 0.25 MG tablet, Take 1 tablet (0.25 mg total) by mouth at bedtime as needed for anxiety., Disp: 30 tablet, Rfl: 0 .  aspirin EC 81 MG tablet, Take 81 mg by mouth 2 (two) times daily., Disp: , Rfl:  .  ibuprofen (MOTRIN IB) 200 MG tablet, Take 600 mg by mouth every 6 (six) hours as needed for moderate pain., Disp: , Rfl:  .  metoprolol tartrate (LOPRESSOR) 25 MG tablet, Take 25 mg by mouth daily. , Disp: , Rfl:   Past Medical History: Past Medical History:  Diagnosis Date  . Cartilage tear    rt knee  . Heart murmur   . Medical history non-contributory    see valve rep-  . PONV (postoperative nausea and vomiting)    plus hypotension    Tobacco Use: History  Smoking Status  . Never Smoker  Smokeless Tobacco  . Never Used    Labs: Recent Review Flowsheet Data    There is no flowsheet data to display.      Capillary Blood Glucose: No results found for: GLUCAP   Exercise Target Goals:    Exercise Program Goal: Individual exercise prescription set with THRR, safety & activity barriers. Participant demonstrates ability to understand and report RPE using BORG scale, to self-measure pulse accurately, and to acknowledge the importance of the exercise prescription.  Exercise Prescription Goal: Starting with aerobic activity 30 plus minutes a day, 3 days per week for  initial exercise prescription. Provide home exercise prescription and guidelines that participant acknowledges understanding prior to discharge.  Activity Barriers & Risk Stratification:     Activity Barriers & Cardiac Risk Stratification - 06/13/17 1455      Activity Barriers & Cardiac Risk Stratification   Activity Barriers None   Cardiac Risk Stratification Moderate      6 Minute Walk:     6 Minute Walk    Row Name 06/13/17 1632         6 Minute Walk   Phase Initial     Distance 1220 feet     Walk Time 6 minutes     # of Rest Breaks 0     MPH 2.3     METS 4     RPE 12     VO2 Peak 14     Symptoms No     Resting HR 80 bpm     Resting BP 101/65     Max Ex. HR 125 bpm     Max Ex. BP 119/79     2 Minute Post BP 110/70        Oxygen Initial Assessment:   Oxygen Re-Evaluation:   Oxygen Discharge (Final Oxygen Re-Evaluation):   Initial Exercise Prescription:     Initial Exercise Prescription - 06/13/17 1600      Date of Initial Exercise RX and Referring Provider   Date 06/13/17  Referring Provider Delrae Rend MD     Treadmill   MPH 2.5   Grade 2   Minutes 10   METs 3     Bike   Level 0.7   Minutes 10   METs 2.8     NuStep   Level 2   SPM 85   Minutes 10   METs 2.7     Prescription Details   Frequency (times per week) 3   Duration Progress to 45 minutes of aerobic exercise without signs/symptoms of physical distress     Intensity   THRR 40-80% of Max Heartrate 67-134   Ratings of Perceived Exertion 11-13   Perceived Dyspnea 0-4     Progression   Progression Continue to progress workloads to maintain intensity without signs/symptoms of physical distress.     Resistance Training   Training Prescription Yes   Weight 2   Reps 10-15      Perform Capillary Blood Glucose checks as needed.  Exercise Prescription Changes:     Exercise Prescription Changes    Row Name 06/24/17 1100 07/19/17 1400 08/05/17 1600 08/16/17 1700  08/27/17 1600     Response to Exercise   Blood Pressure (Admit) 118/82 102/68 113/71 100/76 108/70   Blood Pressure (Exercise) 118/78 108/60 112/70 144/76 122/64   Blood Pressure (Exit) 104/68 114/68 98/62 102/62 102/70   Heart Rate (Admit) 92 bpm 88 bpm 75 bpm 92 bpm 86 bpm   Heart Rate (Exercise) 114 bpm 116 bpm 114 bpm 138 bpm 125 bpm   Heart Rate (Exit) 82 bpm 87 bpm 75 bpm 91 bpm 89 bpm   Rating of Perceived Exertion (Exercise)  - 12 12 12 11    Symptoms  -  - none none none   Duration Progress to 45 minutes of aerobic exercise without signs/symptoms of physical distress Progress to 45 minutes of aerobic exercise without signs/symptoms of physical distress Progress to 45 minutes of aerobic exercise without signs/symptoms of physical distress Progress to 45 minutes of aerobic exercise without signs/symptoms of physical distress Progress to 45 minutes of aerobic exercise without signs/symptoms of physical distress   Intensity THRR unchanged THRR unchanged THRR unchanged THRR unchanged THRR unchanged     Progression   Progression Continue to progress workloads to maintain intensity without signs/symptoms of physical distress. Continue to progress workloads to maintain intensity without signs/symptoms of physical distress. Continue to progress workloads to maintain intensity without signs/symptoms of physical distress. Continue to progress workloads to maintain intensity without signs/symptoms of physical distress. Continue to progress workloads to maintain intensity without signs/symptoms of physical distress.   Average METs 3 3.8 4 4.1 4.3     Resistance Training   Training Prescription Yes Yes Yes Yes Yes   Weight 2 3 4 4 5    Reps 10-15 10-15 10-15 10-15 10-15     Interval Training   Interval Training  -  - No No No     Treadmill   MPH 2.5 2.8 3 3  3.5   Grade 2 2 3 3 3    Minutes 10 10 10 10 10    METs 3 3.91 4.54 4.54 5.13     Bike   Level 0.7 1.2 -  -  -   Minutes 10 10 -  -  -    METs 2.8 4.83 -  -  -     Recumbant Bike   Level  -  - 3 3.5 3.5   Minutes  -  - 10 10 10  METs  -  - 3.3 3.3  -     NuStep   Level SPM 85 85 90 90 90   Minutes METs 2.7 2.8 4.1 3.7 3.4     Home Exercise Plan   Plans to continue exercise at  - Home (comment) Home (comment)  Patient is doing yoga 3 days per week and walking daily. Home (comment)  Patient is doing yoga 3 days per week and walking daily. Home (comment)  Patient is doing yoga 3 days per week and walking daily.   Frequency  - Add 3 additional days to program exercise sessions. Add 3 additional days to program exercise sessions. Add 3 additional days to program exercise sessions. Add 3 additional days to program exercise sessions.   Initial Home Exercises Provided  -  - 08/05/17 08/05/17 08/05/17   Row Name 09/09/17 1700             Response to Exercise   Blood Pressure (Admit) 120/62       Blood Pressure (Exercise) 114/70       Blood Pressure (Exit) 100/76       Heart Rate (Admit) 80 bpm       Heart Rate (Exercise) 131 bpm       Heart Rate (Exit) 71 bpm       Rating of Perceived Exertion (Exercise) 14       Symptoms none       Duration Progress to 45 minutes of aerobic exercise without signs/symptoms of physical distress       Intensity THRR unchanged         Progression   Progression Continue to progress workloads to maintain intensity without signs/symptoms of physical distress.       Average METs 4.4         Resistance Training   Training Prescription Yes       Weight 5       Reps 10-15       Time 10 Minutes         Interval Training   Interval Training No         Treadmill   MPH -       Grade -       Minutes -       METs -         Recumbant Bike   Level 3.5       Minutes 10         NuStep   Level 5       SPM 90       Minutes 10       METs 3.9         Rower   Watts 23       Minutes 10       METs 4.84         Home Exercise Plan   Plans to continue  exercise at Home (comment)  Patient is doing yoga 3 days per week and walking daily.       Frequency Add 3 additional days to program exercise sessions.       Initial Home Exercises Provided 08/05/17          Exercise Comments:     Exercise Comments    Row Name 06/24/17 1142 07/19/17 1444 08/05/17 1649 08/16/17 1736 08/30/17 1645   Exercise Comments Pt is off to a great start with  exercse Reviewed goals with pt.  Reviewed home exercise guidelines with pt. Pt moved from upright stationary bike to recumbent bike. Reviewed METS and goals. Reviewed METs with patient.   Row Name 09/09/17 1526           Exercise Comments Patient is making steady progress with exercise, increasing workloads as tolerated.          Exercise Goals and Review:     Exercise Goals    Row Name 06/13/17 1456             Exercise Goals   Increase Physical Activity Yes       Intervention Develop an individualized exercise prescription for aerobic and resistive training based on initial evaluation findings, risk stratification, comorbidities and participant's personal goals.;Provide advice, education, support and counseling about physical activity/exercise needs.       Expected Outcomes Achievement of increased cardiorespiratory fitness and enhanced flexibility, muscular endurance and strength shown through measurements of functional capacity and personal statement of participant.       Increase Strength and Stamina Yes       Intervention Provide advice, education, support and counseling about physical activity/exercise needs.;Develop an individualized exercise prescription for aerobic and resistive training based on initial evaluation findings, risk stratification, comorbidities and participant's personal goals.       Expected Outcomes Achievement of increased cardiorespiratory fitness and enhanced flexibility, muscular endurance and strength shown through measurements of functional capacity and personal  statement of participant.          Exercise Goals Re-Evaluation :     Exercise Goals Re-Evaluation    Row Name 07/19/17 1443 08/16/17 1737 09/09/17 1525         Exercise Goal Re-Evaluation   Exercise Goals Review Increase Physical Activity;Increase Strenth and Stamina Increase Physical Activity;Increase Strength and Stamina Increase Physical Activity;Increase Strength and Stamina;Able to understand and use rate of perceived exertion (RPE) scale     Comments Pt is doing well with exercise and tolerating workload increases well. Pt states she feels his fitness level is getting better. Patient is making steady progress with exercise and is currently averaging 4.4 METs.     Expected Outcomes Continue with exercise Rx and gradually increase workload in order to increase cardiorespiratory fitness Increase workloads as tolerated. Continue increasing workloads as tolerated.         Discharge Exercise Prescription (Final Exercise Prescription Changes):     Exercise Prescription Changes - 09/09/17 1700      Response to Exercise   Blood Pressure (Admit) 120/62   Blood Pressure (Exercise) 114/70   Blood Pressure (Exit) 100/76   Heart Rate (Admit) 80 bpm   Heart Rate (Exercise) 131 bpm   Heart Rate (Exit) 71 bpm   Rating of Perceived Exertion (Exercise) 14   Symptoms none   Duration Progress to 45 minutes of aerobic exercise without signs/symptoms of physical distress   Intensity THRR unchanged     Progression   Progression Continue to progress workloads to maintain intensity without signs/symptoms of physical distress.   Average METs 4.4     Resistance Training   Training Prescription Yes   Weight 5   Reps 10-15   Time 10 Minutes     Interval Training   Interval Training No     Treadmill   MPH --   Grade --   Minutes --   METs --     Recumbant Bike   Level 3.5   Minutes 10  NuStep   Level 5   SPM 90   Minutes 10   METs 3.9     Rower   Watts 23   Minutes 10    METs 4.84     Home Exercise Plan   Plans to continue exercise at Home (comment)  Patient is doing yoga 3 days per week and walking daily.   Frequency Add 3 additional days to program exercise sessions.   Initial Home Exercises Provided 08/05/17      Nutrition:  Target Goals: Understanding of nutrition guidelines, daily intake of sodium 1500mg , cholesterol 200mg , calories 30% from fat and 7% or less from saturated fats, daily to have 5 or more servings of fruits and vegetables.  Biometrics:     Pre Biometrics - 06/13/17 1639      Pre Biometrics   Waist Circumference 32.5 inches   Hip Circumference 38 inches   Waist to Hip Ratio 0.86 %   Triceps Skinfold 36 mm   % Body Fat 36.3 %   Grip Strength 31 kg   Flexibility 15.5 in   Single Leg Stand 15.4 seconds       Nutrition Therapy Plan and Nutrition Goals:     Nutrition Therapy & Goals - 06/14/17 0922      Nutrition Therapy   Diet Therapeutic Lifestyle Changes     Personal Nutrition Goals   Nutrition Goal Continue working toward eating "cleaner" foods   Personal Goal #2 Pt to identify food quantities necessary to achieve weight loss of 5 lb at graduation from cardiac rehab.      Intervention Plan   Intervention Prescribe, educate and counsel regarding individualized specific dietary modifications aiming towards targeted core components such as weight, hypertension, lipid management, diabetes, heart failure and other comorbidities.   Expected Outcomes Short Term Goal: Understand basic principles of dietary content, such as calories, fat, sodium, cholesterol and nutrients.;Long Term Goal: Adherence to prescribed nutrition plan.      Nutrition Discharge: Nutrition Scores:     Nutrition Assessments - 06/14/17 0922      MEDFICTS Scores   Pre Score 37      Nutrition Goals Re-Evaluation:     Nutrition Goals Re-Evaluation    Row Name 07/15/17 1535             Goals   Nutrition Goal Continue working toward  eating "cleaner" foods       Comment Pt continues working toward goal and is please with her progress. Per discussion, pt has started eating cashew yogurt. Added sugar in foods discussed.          Personal Goal #2 Re-Evaluation   Personal Goal #2 Pt to identify food quantities necessary to achieve weight loss of 5 lb at graduation from cardiac rehab.           Nutrition Goals Re-Evaluation:     Nutrition Goals Re-Evaluation    Row Name 07/15/17 1535             Goals   Nutrition Goal Continue working toward eating "cleaner" foods       Comment Pt continues working toward goal and is please with her progress. Per discussion, pt has started eating cashew yogurt. Added sugar in foods discussed.          Personal Goal #2 Re-Evaluation   Personal Goal #2 Pt to identify food quantities necessary to achieve weight loss of 5 lb at graduation from cardiac rehab.  Nutrition Goals Discharge (Final Nutrition Goals Re-Evaluation):     Nutrition Goals Re-Evaluation - 07/15/17 1535      Goals   Nutrition Goal Continue working toward eating "cleaner" foods   Comment Pt continues working toward goal and is please with her progress. Per discussion, pt has started eating cashew yogurt. Added sugar in foods discussed.      Personal Goal #2 Re-Evaluation   Personal Goal #2 Pt to identify food quantities necessary to achieve weight loss of 5 lb at graduation from cardiac rehab.       Psychosocial: Target Goals: Acknowledge presence or absence of significant depression and/or stress, maximize coping skills, provide positive support system. Participant is able to verbalize types and ability to use techniques and skills needed for reducing stress and depression.  Initial Review & Psychosocial Screening:     Initial Psych Review & Screening - 06/13/17 1705      Initial Review   Current issues with Current Stress Concerns;Current Anxiety/Panic   Source of Stress Concerns Chronic  Illness;Unable to participate in former interests or hobbies     Kayla [employer]   Good Support System? Yes     Barriers   Psychosocial barriers to participate in program The patient should benefit from training in stress management and relaxation.     Screening Interventions   Interventions To provide support and resources with identified psychosocial needs  pt sees a counselor      Quality of Life Scores:     Quality of Life - 06/13/17 1640      Quality of Life Scores   Health/Function Pre (P)  13.29 %   Socioeconomic Pre (P)  26.79 %   Psych/Spiritual Pre (P)  17.42 %   Kayla Archer Pre (P)  27.6 %      PHQ-9: Recent Review Flowsheet Data    Depression screen [facility] 2/9 06/17/2017   Decreased Interest 1   Down, Depressed, Hopeless 1   PHQ - 2 Score 2   Altered sleeping 2   Tired, decreased energy 0   Change in appetite 0   Feeling bad or failure about yourself  0   Trouble concentrating 0   Moving slowly or fidgety/restless 0   Suicidal thoughts 0   PHQ-9 Score 4   Difficult doing work/chores Not difficult at all     Interpretation of Total Score  Total Score Depression Severity:  1-4 = Minimal depression, 5-9 = Mild depression, 10-14 = Moderate depression, 15-19 = Moderately severe depression, 20-27 = Severe depression   Psychosocial Evaluation and Intervention:   Psychosocial Re-Evaluation:     Psychosocial Re-Evaluation    Row Name 06/25/17 1713 07/25/17 1223 08/20/17 1225 09/17/17 1808       Psychosocial Re-Evaluation   Current issues with Current Stress Concerns Current Stress Concerns Current Stress Concerns Current Stress Concerns    Comments Kayla Archer reports feeling better since she has been partcipating in phase 2 cardiac rehab.  - Kayla Archer reports feeling better since she has been partcipating in phase 2 cardiac rehab. Kayla Archer reports feeling better since she has been partcipating in phase 2 cardiac rehab.    Interventions Stress management education  Stress management education;Encouraged to attend Cardiac Rehabilitation for the exercise Encouraged to attend Cardiac Rehabilitation for the exercise;Stress management education Encouraged to attend Cardiac Rehabilitation for the exercise;Stress management education    Continue Psychosocial Services   - Follow up required by staff Follow up required by staff No Follow up required  Initial Review   Source of Stress Concerns Chronic Illness;Unable to participate in former interests or hobbies Chronic Illness  Kayla Archer feels better but continues to be concerned about her health Kayla Archer  Kayla Archer just found out her mother has cancer and is under hospice care Kayla Archer       Psychosocial Discharge (Final Psychosocial Re-Evaluation):     Psychosocial Re-Evaluation - 09/17/17 1808      Psychosocial Re-Evaluation   Current issues with Current Stress Concerns   Comments Kayla Archer reports feeling better since she has been partcipating in phase 2 cardiac rehab.   Interventions Encouraged to attend Cardiac Rehabilitation for the exercise;Stress management education   Continue Psychosocial Services  No Follow up required     Initial Review   Source of Stress Concerns Kayla Archer      Vocational Rehabilitation: Provide vocational rehab assistance to qualifying candidates.   Vocational Rehab Evaluation & Intervention:   Education: Education Goals: Education classes will be provided on a weekly basis, covering required topics. Participant will state understanding/return demonstration of topics presented.  Learning Barriers/Preferences:     Learning Barriers/Preferences - 06/13/17 1455      Learning Barriers/Preferences   Learning Barriers None   Learning Preferences Verbal Instruction;Skilled Demonstration;Audio      Education Topics: Count Your Pulse:  -Group instruction provided by verbal instruction, demonstration, patient participation and written materials to support subject.  Instructors  address importance of being able to find your pulse and how to count your pulse when at home without a heart monitor.  Patients get hands on experience counting their pulse with staff help and individually.   Heart Attack, Angina, and Risk Factor Modification:  -Group instruction provided by verbal instruction, video, and written materials to support subject.  Instructors address signs and symptoms of angina and heart attacks.    Also discuss risk factors for heart disease and how to make changes to improve heart health risk factors.   Functional Fitness:  -Group instruction provided by verbal instruction, demonstration, patient participation, and written materials to support subject.  Instructors address safety measures for doing things around the house.  Discuss how to get up and down off the floor, how to pick things up properly, how to safely get out of a chair without assistance, and balance training.   Meditation and Mindfulness:  -Group instruction provided by verbal instruction, patient participation, and written materials to support subject.  Instructor addresses importance of mindfulness and meditation practice to help reduce stress and improve awareness.  Instructor also leads participants through a meditation exercise.    CARDIAC REHAB PHASE II EXERCISE from 07/24/2017 in [facility] CARDIAC REHAB  Date  07/24/17  Instruction Review Code  2- meets goals/outcomes      Stretching for Flexibility and Mobility:  -Group instruction provided by verbal instruction, patient participation, and written materials to support subject.  Instructors lead participants through series of stretches that are designed to increase flexibility thus improving mobility.  These stretches are additional exercise for major muscle groups that are typically performed during regular warm up and cool down.   Hands Only CPR:  -Group verbal, video, and participation provides a basic overview of AHA  guidelines for community CPR. Role-play of emergencies allow participants the opportunity to practice calling for help and chest compression technique with discussion of AED use.   Hypertension: -Group verbal and written instruction that provides a basic overview of hypertension including the most recent diagnostic guidelines, risk factor reduction with self-care instructions  and medication management.    Nutrition I class: Heart Healthy Eating:  -Group instruction provided by PowerPoint slides, verbal discussion, and written materials to support subject matter. The instructor gives an explanation and review of the Therapeutic Lifestyle Changes diet recommendations, which includes a discussion on lipid goals, dietary fat, sodium, fiber, plant stanol/sterol esters, sugar, and the components of a well-balanced, healthy diet.   Nutrition II class: Lifestyle Skills:  -Group instruction provided by PowerPoint slides, verbal discussion, and written materials to support subject matter. The instructor gives an explanation and review of label reading, grocery shopping for heart health, heart healthy recipe modifications, and ways to make healthier choices when eating out.   Diabetes Question & Answer:  -Group instruction provided by PowerPoint slides, verbal discussion, and written materials to support subject matter. The instructor gives an explanation and review of diabetes co-morbidities, pre- and post-prandial blood glucose goals, pre-exercise blood glucose goals, signs, symptoms, and treatment of hypoglycemia and hyperglycemia, and foot care basics.   Diabetes Blitz:  -Group instruction provided by PowerPoint slides, verbal discussion, and written materials to support subject matter. The instructor gives an explanation and review of the physiology behind type 1 and type 2 diabetes, diabetes medications and rational behind using different medications, pre- and post-prandial blood glucose  recommendations and Hemoglobin A1c goals, diabetes diet, and exercise including blood glucose guidelines for exercising safely.    Portion Distortion:  -Group instruction provided by PowerPoint slides, verbal discussion, written materials, and food models to support subject matter. The instructor gives an explanation of serving size versus portion size, changes in portions sizes over the last 20 years, and what consists of a serving from each food group.   Stress Management:  -Group instruction provided by verbal instruction, video, and written materials to support subject matter.  Instructors review role of stress in heart disease and how to cope with stress positively.     Exercising on Your Own:  -Group instruction provided by verbal instruction, power point, and written materials to support subject.  Instructors discuss benefits of exercise, components of exercise, frequency and intensity of exercise, and end points for exercise.  Also discuss use of nitroglycerin and activating EMS.  Review options of places to exercise outside of rehab.  Review guidelines for sex with heart disease.   Cardiac Drugs I:  -Group instruction provided by verbal instruction and written materials to support subject.  Instructor reviews cardiac drug classes: antiplatelets, anticoagulants, beta blockers, and statins.  Instructor discusses reasons, side effects, and lifestyle considerations for each drug class.   Cardiac Drugs II:  -Group instruction provided by verbal instruction and written materials to support subject.  Instructor reviews cardiac drug classes: angiotensin converting enzyme inhibitors (ACE-I), angiotensin II receptor blockers (ARBs), nitrates, and calcium channel blockers.  Instructor discusses reasons, side effects, and lifestyle considerations for each drug class.   CARDIAC REHAB PHASE II EXERCISE from 07/24/2017 in [facility] CARDIAC REHAB  Date  06/19/17  Instruction Review  Code  2- meets goals/outcomes      Anatomy and Physiology of the Circulatory System:  Group verbal and written instruction and models provide basic cardiac anatomy and physiology, with the coronary electrical and arterial systems. Review of: AMI, Angina, Valve disease, Heart Failure, Peripheral Artery Disease, Cardiac Arrhythmia, Pacemakers, and the ICD.   Other Education:  -Group or individual verbal, written, or video instructions that support the educational goals of the cardiac rehab program.   Knowledge Questionnaire Score:  Knowledge Questionnaire Score - 06/13/17 1631      Knowledge Questionnaire Score   Pre Score 21/24      Core Components/Risk Factors/Patient Goals at Admission:     Personal Goals and Risk Factors at Admission - 06/13/17 1640      Core Components/Risk Factors/Patient Goals on Admission   Stress Yes   Intervention Offer individual and/or small group education and counseling on adjustment to heart disease, stress management and health-related lifestyle change. Teach and support self-help strategies.;Refer participants experiencing significant psychosocial distress to appropriate mental health specialists for further evaluation and treatment. When possible, include Kayla Archer members and significant others in education/counseling sessions.   Expected Outcomes Short Term: Participant demonstrates changes in health-related behavior, relaxation and other stress management skills, ability to obtain effective social support, and compliance with psychotropic medications if prescribed.;Long Term: Emotional wellbeing is indicated by absence of clinically significant psychosocial distress or social isolation.      Core Components/Risk Factors/Patient Goals Review:      Goals and Risk Factor Review    Row Name 08/20/17 1222 09/17/17 1806           Core Components/Risk Factors/Patient Goals Review   Personal Goals Review Stress Stress      Review -  Kayla Archer  continues to have some stress. Kayla Archer just found out her mother has lung cancer Kayla Archer is doing well with exercise. Kayla Archer's mother is under hospice care in [place]      Expected Outcomes -  Patient will be able to manage her stressors upon completeion from cardiac rehab Kayla Archer will continue to come to exercise. Will offer emotional support as needed         Core Components/Risk Factors/Patient Goals at Discharge (Final Review):      Goals and Risk Factor Review - 09/17/17 1806      Core Components/Risk Factors/Patient Goals Review   Personal Goals Review Stress   Review Kayla Archer is doing well with exercise. Kayla Archer's mother is under hospice care in [place]   Expected Outcomes Kayla Archer will continue to come to exercise. Will offer emotional support as needed      ITP Comments:     ITP Comments    Row Name 06/13/17 1456           ITP Comments Dr. Armanda Magic, Medical Director          Comments: Araeya is making expected progress toward personal goals after completing 24 sessions. Recommend continued exercise and life style modification education including  stress management and relaxation techniques to decrease cardiac risk profile.Gladstone Lighter, RN,BSN 09/17/2017 6:10 PM

## 2017-09-18 ENCOUNTER — Encounter (HOSPITAL_COMMUNITY)
Admission: RE | Admit: 2017-09-18 | Discharge: 2017-09-18 | Disposition: A | Discharge status: Home / Self Care | Payer: BLUE CROSS/BLUE SHIELD | Source: Ambulatory Visit | Attending: Cardiology | Admitting: Cardiology

## 2017-09-18 DIAGNOSIS — Z9889 Other specified postprocedural states: Secondary | ICD-10-CM

## 2017-09-26 DIAGNOSIS — M25561 Pain in right knee: Secondary | ICD-10-CM | POA: Diagnosis not present

## 2017-09-26 DIAGNOSIS — Q741 Congenital malformation of knee: Secondary | ICD-10-CM | POA: Diagnosis not present

## 2017-09-26 DIAGNOSIS — G8929 Other chronic pain: Secondary | ICD-10-CM | POA: Diagnosis not present

## 2017-10-01 ENCOUNTER — Other Ambulatory Visit (HOSPITAL_COMMUNITY): Payer: Self-pay | Admitting: Orthopedic Surgery

## 2017-10-01 DIAGNOSIS — M25561 Pain in right knee: Secondary | ICD-10-CM

## 2017-10-08 DIAGNOSIS — I4892 Unspecified atrial flutter: Secondary | ICD-10-CM | POA: Diagnosis not present

## 2017-10-08 DIAGNOSIS — I351 Nonrheumatic aortic (valve) insufficiency: Secondary | ICD-10-CM | POA: Diagnosis not present

## 2017-10-08 DIAGNOSIS — I350 Nonrheumatic aortic (valve) stenosis: Secondary | ICD-10-CM | POA: Diagnosis not present

## 2017-10-08 DIAGNOSIS — M791 Myalgia, unspecified site: Secondary | ICD-10-CM | POA: Diagnosis not present

## 2017-10-08 DIAGNOSIS — I442 Atrioventricular block, complete: Secondary | ICD-10-CM | POA: Diagnosis not present

## 2017-10-08 DIAGNOSIS — J029 Acute pharyngitis, unspecified: Secondary | ICD-10-CM | POA: Diagnosis not present

## 2017-10-10 ENCOUNTER — Telehealth (HOSPITAL_COMMUNITY): Payer: Self-pay | Admitting: *Deleted

## 2017-10-11 ENCOUNTER — Encounter (HOSPITAL_COMMUNITY): Payer: Self-pay | Admitting: *Deleted

## 2017-10-11 DIAGNOSIS — Z9889 Other specified postprocedural states: Secondary | ICD-10-CM

## 2017-10-11 NOTE — Progress Notes (Signed)
Discharge Progress Report  Patient Details  Name: Kayla Archer MRN: [id number] Date of Birth: 12-07-1966 Referring Provider:     CARDIAC REHAB PHASE II ORIENTATION from 06/13/2017 in [place]  Referring Provider  Kela Millin MD       Number of Visits: 24   Reason for Discharge:  Early Exit:  Back to work  Smoking History:  Social History   Tobacco Use  Smoking Status Never Smoker  Smokeless Tobacco Never Used    Diagnosis:  S/P heart valve repair  ADL UCSD:   Initial Exercise Prescription:   Discharge Exercise Prescription (Final Exercise Prescription Changes): Exercise Prescription Changes - 09/09/17 1700      Response to Exercise   Blood Pressure (Admit)  120/62    Blood Pressure (Exercise)  114/70    Blood Pressure (Exit)  100/76    Heart Rate (Admit)  80 bpm    Heart Rate (Exercise)  131 bpm    Heart Rate (Exit)  71 bpm    Rating of Perceived Exertion (Exercise)  14    Symptoms  none    Duration  Progress to 45 minutes of aerobic exercise without signs/symptoms of physical distress    Intensity  THRR unchanged      Progression   Progression  Continue to progress workloads to maintain intensity without signs/symptoms of physical distress.    Average METs  4.4      Resistance Training   Training Prescription  Yes    Weight  5    Reps  10-15    Time  10 Minutes      Interval Training   Interval Training  No      Treadmill   MPH  --    Grade  --    Minutes  --    METs  --      Recumbant Bike   Level  3.5    Minutes  10      NuStep   Level  5    SPM  90    Minutes  10    METs  3.9      Rower   Watts  23    Minutes  10    METs  4.84      Home Exercise Plan   Plans to continue exercise at  Home (comment) Patient is doing yoga 3 days per week and walking daily.    Frequency  Add 3 additional days to program exercise sessions.    Initial Home Exercises Provided  08/05/17       Functional  Capacity:   Psychological, QOL, Others - Outcomes: PHQ 2/9: Depression screen PHQ 2/9 06/17/2017  Decreased Interest 1  Down, Depressed, Hopeless 1  PHQ - 2 Score 2  Altered sleeping 2  Tired, decreased energy 0  Change in appetite 0  Feeling bad or failure about yourself  0  Trouble concentrating 0  Moving slowly or fidgety/restless 0  Suicidal thoughts 0  PHQ-9 Score 4  Difficult doing work/chores Not difficult at all    Quality of Life:   Personal Goals: Goals established at orientation with interventions provided to work toward goal.    Personal Goals Discharge: Goals and Risk Factor Review    Row Name 08/20/17 1222 09/17/17 1806 10/11/17 1234         Core Components/Risk Factors/Patient Goals Review   Personal Goals Review  Stress  Stress  Stress     Review  -  Shadara continues to have some stress. Mica just found out her mother has lung cancer  Oris Drone is doing well with exercise. Trystin's mother is under hospice care in [place] did well with exercise, Kambree's mother recently passed away.     Expected Outcomes  - Patient will be able to manage her stressors upon completeion from cardiac rehab  Lianni will continue to come to exercise. Will offer emotional support as needed  Denim will continue to exercise on her own and attend yoga classes        Exercise Goals and Review:   Nutrition & Weight - Outcomes:    Nutrition:   Nutrition Discharge:   Education Questionnaire Score:   Pt graduated from cardiac rehab program today with completion of 24 exercise sessions in Phase II. Pt maintained fair attendance and progressed nicely during his participation in rehab as evidenced by increased MET level. .  Pt has made significant lifestyle changes and should be commended for his success. Pt feels he has achieved his goals during cardiac rehab.  Kellyanne's last day of exercise was on 09/09/2017. Mtrina's mother recently passed away. Nikeisha was  absent from cardiac rehab to attend to family matters and work obligations. Zeanna plans to continue exercise on her own and participate in Yoga.Barnet Pall, RN,BSN 11/08/2017 9:12 AM

## 2017-10-14 ENCOUNTER — Ambulatory Visit (HOSPITAL_COMMUNITY)
Admission: RE | Admit: 2017-10-14 | Discharge: 2017-10-14 | Disposition: A | Discharge status: Home / Self Care | Payer: BLUE CROSS/BLUE SHIELD | Source: Ambulatory Visit | Attending: Orthopedic Surgery | Admitting: Orthopedic Surgery

## 2017-10-14 DIAGNOSIS — M25561 Pain in right knee: Secondary | ICD-10-CM | POA: Diagnosis not present

## 2017-10-14 DIAGNOSIS — Z950 Presence of cardiac pacemaker: Secondary | ICD-10-CM | POA: Diagnosis not present

## 2017-10-14 DIAGNOSIS — Z45018 Encounter for adjustment and management of other part of cardiac pacemaker: Secondary | ICD-10-CM | POA: Diagnosis not present

## 2017-10-14 DIAGNOSIS — M7041 Prepatellar bursitis, right knee: Secondary | ICD-10-CM | POA: Diagnosis not present

## 2017-10-14 NOTE — Progress Notes (Signed)
Programmed pacer back to pre MRI settings by [facility]reda[facility]andon from [employer]. Patient tolerated procedure well.

## 2017-10-14 NOTE — Progress Notes (Signed)
Pacemaker was programmed to pace at 80 for MRI by [facility] [employer]. Monitored in paced rhythm at 80.

## 2017-10-16 DIAGNOSIS — I480 Paroxysmal atrial fibrillation: Secondary | ICD-10-CM | POA: Diagnosis not present

## 2017-10-16 DIAGNOSIS — Z954 Presence of other heart-valve replacement: Secondary | ICD-10-CM | POA: Diagnosis not present

## 2017-10-16 DIAGNOSIS — Z8679 Personal history of other diseases of the circulatory system: Secondary | ICD-10-CM | POA: Diagnosis not present

## 2017-10-16 DIAGNOSIS — Z9889 Other specified postprocedural states: Secondary | ICD-10-CM | POA: Diagnosis not present

## 2017-10-25 ENCOUNTER — Encounter: Payer: Self-pay | Admitting: Family Medicine

## 2017-10-25 ENCOUNTER — Ambulatory Visit (INDEPENDENT_AMBULATORY_CARE_PROVIDER_SITE_OTHER): Payer: BLUE CROSS/BLUE SHIELD | Admitting: Family Medicine

## 2017-10-25 VITALS — BP 130/80 | HR 82 | Ht 63.0 in | Wt 139.0 lb

## 2017-10-25 DIAGNOSIS — M542 Cervicalgia: Secondary | ICD-10-CM

## 2017-10-25 DIAGNOSIS — M549 Dorsalgia, unspecified: Secondary | ICD-10-CM

## 2017-10-25 DIAGNOSIS — M999 Biomechanical lesion, unspecified: Secondary | ICD-10-CM | POA: Insufficient documentation

## 2017-10-25 DIAGNOSIS — M5416 Radiculopathy, lumbar region: Secondary | ICD-10-CM | POA: Diagnosis not present

## 2017-10-25 MED ORDER — METHYLPREDNISOLONE ACETATE 80 MG/ML IJ SUSP
80.0000 mg | Freq: Once | INTRAMUSCULAR | Status: AC
  Administered 2017-10-25: 80 mg via INTRAMUSCULAR

## 2017-10-25 MED ORDER — KETOROLAC TROMETHAMINE 60 MG/2ML IM SOLN
60.0000 mg | Freq: Once | INTRAMUSCULAR | Status: AC
  Administered 2017-10-25: 60 mg via INTRAMUSCULAR

## 2017-10-25 NOTE — Assessment & Plan Note (Signed)
Likely some compensation as well as some discomfort from time to time more posture and ergonomics.  Responds well to osteopathic manipulation.  We discussed icing regimen, home exercises, discussed proper lifting mechanics.  Follow-up again once again after the epidural for the lumbar spine.

## 2017-10-25 NOTE — Assessment & Plan Note (Signed)
Worsening symptoms with left lumbar radiculopathy.  Patient continues to have a positive straight leg test.  Likely no weakness has been noted.  I do feel that patient could respond well to an L5 nerve root injection.  Patient will be scheduled with this now secondary to patient not being on any blood thinners.  Patient does have a pacemaker.  Hopefully patient does very well and will follow up with me in 2-3 weeks later.  Patient will likely start home exercises, osteopathic manipulation, and other modalities at that time.

## 2017-10-25 NOTE — Assessment & Plan Note (Signed)
Decision today to treat with OMT was based on Physical Exam  After verbal consent patient was treated with HVLA, ME, FPR techniques in cervical, thoracic, rib areas  Patient tolerated the procedure well with improvement in symptoms  Patient given exercises, stretches and lifestyle modifications  See medications in patient instructions if given  Patient will follow up in 4 weeks depending on intervention for the lumbar spine

## 2017-10-25 NOTE — Progress Notes (Signed)
Kayla Archer D.O. [facility] [address] [place], [place] [address] Phone: [phone] Subjective:    I'm seeing this patient by the request  of:    CC: Back pain follow-up  BJY:NWGNFAOZHYHPI:Subjective  Kayla Archer is a 51 y.o. female coming in with complaint of back pain.  Patient was seen previously and had more of a sacroiliac dysfunction as well as some lumbar radiculopathy.  Had been responding fairly well to osteopathic manipulation.  Patient did have a heart valve replacement done and had been in rehabilitation for multiple months.  Back pain.  Seems to be giving her abdominal radicular symptoms down the left leg still.  Still in the L5 distribution.  Patient states that she has been cleared now for possible injections in the back.  Patient is wondering if that will be possible.   Patient has responded before to neck and upper back pain to manipulation.  Patient is having some increasing tightness again.  Feels that some of it is secondary to stress relation patient is also.     Past Medical History:  Diagnosis Date  . Cartilage tear    rt knee  . Heart murmur   . Medical history non-contributory    see valve rep-  . PONV (postoperative nausea and vomiting)    plus hypotension   Past Surgical History:  Procedure Laterality Date  . ARTHROSCOPY KNEE Right 12/28/2013   [facility]ucey[facility] Steve[facility]  . 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 INSERT / REPLACE / REMOVE PACEMAKER     at [facility] 04/2017 for complete heart block  . LABIAL REDUCTION Bilateral 10/04/2016   Performed by Marcelle OverlieGrewal, Michelle, [facility]  . TRANSESOPHAGEAL ECHOCARDIOGRAM (TEE) N/A 02/08/2017   Performed by Thurmon Fairroitoru, Mihai, MD [facility]  . VALVE REPLACEMENT  04/2017 [facility]   Social History   Socioeconomic History  . Marital status: Married    Spouse name: None  . Number of children: None  . Years of education: None  . Highest  education level: None  Social Needs  . Financial resource strain: None  . Food insecurity - worry: None  . Food insecurity - inability: None  . Transportation needs - medical: None  . Transportation needs - non-medical: None  Occupational History  . None  Tobacco Use  . Smoking status: Never Smoker  . Smokeless tobacco: Never Used  Substance and Sexual Activity  . Alcohol use: Yes    Alcohol/week: 1.8 oz    Types: 3 Glasses of wine per week  . Drug use: No  . Sexual activity: None  Other Topics Concern  . None  Social History Narrative  . None   No Known Allergies Family History  Problem Relation Age of Onset  . Hypertension Mother   . Diabetes type II Mother   . Other Mother        NONHODGKINS LYMPHOMA  . Stroke Father   . Heart attack Father   . Other Sister        PRIMARY PULMONARY HTN  . Healthy Brother   . Hypertension Sister   . Diabetes type II Sister   . Hypertension Sister   . Diabetes type II Sister      Past medical history, social, surgical and family history all reviewed in electronic medical record.  No pertanent information unless stated regarding to the chief complaint.   Review of Systems:Review  of systems updated and as accurate as of 10/25/17  No headache, visual changes, nausea, vomiting, diarrhea, constipation, dizziness, abdominal pain, skin rash, fevers, chills, night sweats, weight loss, swollen lymph nodes, body aches, joint swelling,  chest pain, shortness of breath, mood changes.  Positive muscle aches  Objective  Blood pressure 130/80, pulse 82, height 5\' 3"  (1.6 m), weight 139 lb (63 kg), SpO2 97 %. Systems examined below as of 10/25/17   General: No apparent distress alert and oriented x3 mood and affect normal, dressed appropriately.  HEENT: Pupils equal, extraocular movements intact  Respiratory: Patient's speak in full sentences and does not appear short of breath  Cardiovascular: No lower extremity edema, non tender, no erythema    Skin: Warm dry intact with no signs of infection or rash on extremities or on axial skeleton.  Vitiligo noted Abdomen: Soft nontender  Neuro: Cranial nerves II through XII are intact, neurovascularly intact in all extremities with 2+ DTRs and 2+ pulses.  Lymph: No lymphadenopathy of posterior or anterior cervical chain or axillae bilaterally.  Gait normal with good balance and coordination.  MSK:  Non tender with full range of motion and good stability and symmetric strength and tone of shoulders, elbows, wrist, hip, knee and ankles bilaterally.  Neck: Inspection mild lossoflordosis. No palpable stepoffs. Negative Spurling's maneuver. Full neck range of motion Grip strength and sensation normal in bilateral hands Strength good C4 to T1 distribution No sensory change to C4 to T1 Negative Hoffman sign bilaterally Reflexes normal  Back Exam:  Inspection: Unremarkable  Motion: Flexion 45 deg, Extension 25 deg, Side Bending to 35 deg bilaterally,  Rotation to 45 deg bilaterally  SLR laying: Positive left XSLR laying: Negative  Palpable tenderness: Tender to palpation in the paraspinal musculature lumbar spine left greater than right. FABER: Positive left. Sensory change: Gross sensation intact to all lumbar and sacral dermatomes.  Reflexes: 2+ at both patellar tendons, 2+ at achilles tendons, Babinski's downgoing.  Strength at foot  Plantar-flexion: 5/5 Dorsi-flexion: 5/5 Eversion: 5/5 Inversion: 5/5  Leg strength  Quad: 5/5 Hamstring: 5/5 Hip flexor: 5/5 Hip abductors: 5/5  Gait unremarkable.    Osteopathic findings C2 flexed rotated and side bent right C4 flexed rotated and side bent left C6 flexed rotated and side bent left T3 extended rotated and side bent right inhaled third rib T6 extended rotated and side bent left    Impression and Recommendations:     This case required medical decision making of moderate complexity.      Note: This dictation was prepared  with Dragon dictation along with smaller phrase technology. Any transcriptional errors that result from this process are unintentional.

## 2017-10-25 NOTE — Patient Instructions (Signed)
Good to see you  We will do a L5 nerve root injection and see how it goes.  2 injections today  Keep up with the Yoga  Ice 20 minutes 2 times daily. Usually after activity and before bed. Once you know when the injection is then see me again 2 weeks AFTER Have a great [place] day!

## 2017-11-05 ENCOUNTER — Telehealth: Payer: Self-pay | Admitting: Family Medicine

## 2017-11-05 NOTE — Telephone Encounter (Signed)
Copied from CRM [phone]#[id number]. Topic: Referral - Status >> Nov 05, 2017 12:50 PM Rudi CocoLathan, Taelyn Nemes M, NT wrote: CRM for notification. See Telephone encounter for:   11/05/17. Pt. Called to notify Antoine PrimasZachary smith that she has heard anything from [facility] about getting the epidural. Please call pt. To give her an update [phone]

## 2017-11-05 NOTE — Telephone Encounter (Signed)
Tried to contact patient but mailbox is full and cannot leave message.  Referral for epidural has been put in, [facility] at [phone] to schedule her appointment.

## 2017-11-08 NOTE — Addendum Note (Signed)
Encounter addended by: Artist Paisichards, Kalii Chesmore M on: 11/08/2017 8:04 AM  Actions taken: Vitals modified, Order Reconciliation Section accessed

## 2017-11-15 ENCOUNTER — Ambulatory Visit
Admission: RE | Admit: 2017-11-15 | Discharge: 2017-11-15 | Disposition: A | Discharge status: Home / Self Care | Payer: BLUE CROSS/BLUE SHIELD | Source: Ambulatory Visit | Attending: Family Medicine | Admitting: Family Medicine

## 2017-11-15 DIAGNOSIS — M5126 Other intervertebral disc displacement, lumbar region: Secondary | ICD-10-CM | POA: Diagnosis not present

## 2017-11-15 DIAGNOSIS — M549 Dorsalgia, unspecified: Secondary | ICD-10-CM

## 2017-11-15 MED ORDER — IOPAMIDOL (ISOVUE-M 200) INJECTION 41%
1.0000 mL | Freq: Once | INTRAMUSCULAR | Status: AC
  Administered 2017-11-15: 1 mL via EPIDURAL

## 2017-11-15 MED ORDER — METHYLPREDNISOLONE ACETATE 40 MG/ML INJ SUSP (RADIOLOG
120.0000 mg | Freq: Once | INTRAMUSCULAR | Status: AC
  Administered 2017-11-15: 120 mg via EPIDURAL

## 2017-11-15 NOTE — Discharge Instructions (Signed)

## 2017-11-18 ENCOUNTER — Other Ambulatory Visit: Payer: BLUE CROSS/BLUE SHIELD

## 2017-12-27 DIAGNOSIS — Z45018 Encounter for adjustment and management of other part of cardiac pacemaker: Secondary | ICD-10-CM | POA: Diagnosis not present

## 2017-12-27 DIAGNOSIS — Z8679 Personal history of other diseases of the circulatory system: Secondary | ICD-10-CM | POA: Diagnosis not present

## 2017-12-27 DIAGNOSIS — Z950 Presence of cardiac pacemaker: Secondary | ICD-10-CM | POA: Diagnosis not present

## 2018-02-09 DIAGNOSIS — Z45018 Encounter for adjustment and management of other part of cardiac pacemaker: Secondary | ICD-10-CM | POA: Diagnosis not present

## 2018-02-09 DIAGNOSIS — Z950 Presence of cardiac pacemaker: Secondary | ICD-10-CM | POA: Diagnosis not present

## 2018-02-09 DIAGNOSIS — Z8679 Personal history of other diseases of the circulatory system: Secondary | ICD-10-CM | POA: Diagnosis not present

## 2018-02-28 IMAGING — DX DG LUMBAR SPINE COMPLETE 4+V
5 series · 5 of 5 positions shown · non-contrast
Comparison: None.

CLINICAL DATA: Lower back pain after playing tennis 3 weeks ago.

EXAM:
LUMBAR SPINE - COMPLETE 4+ VIEW

[l-spine ap]
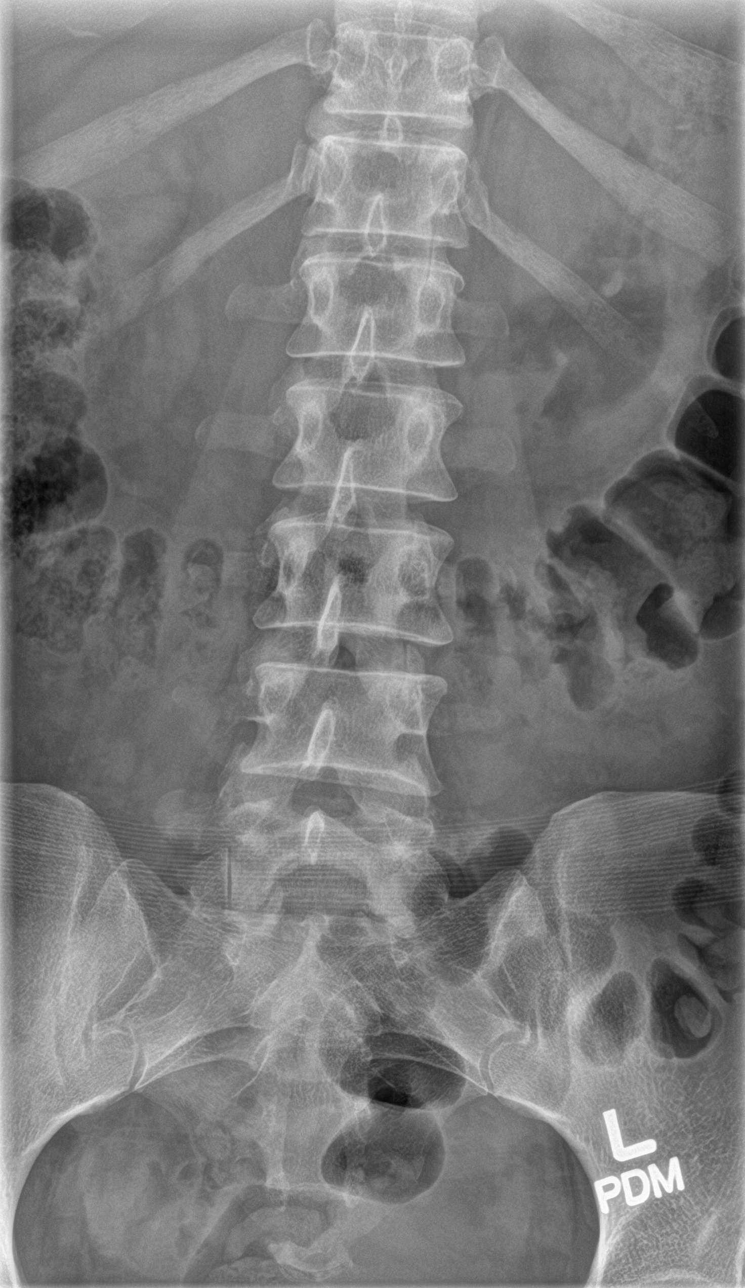

[l-spine obl (1 of 2)]
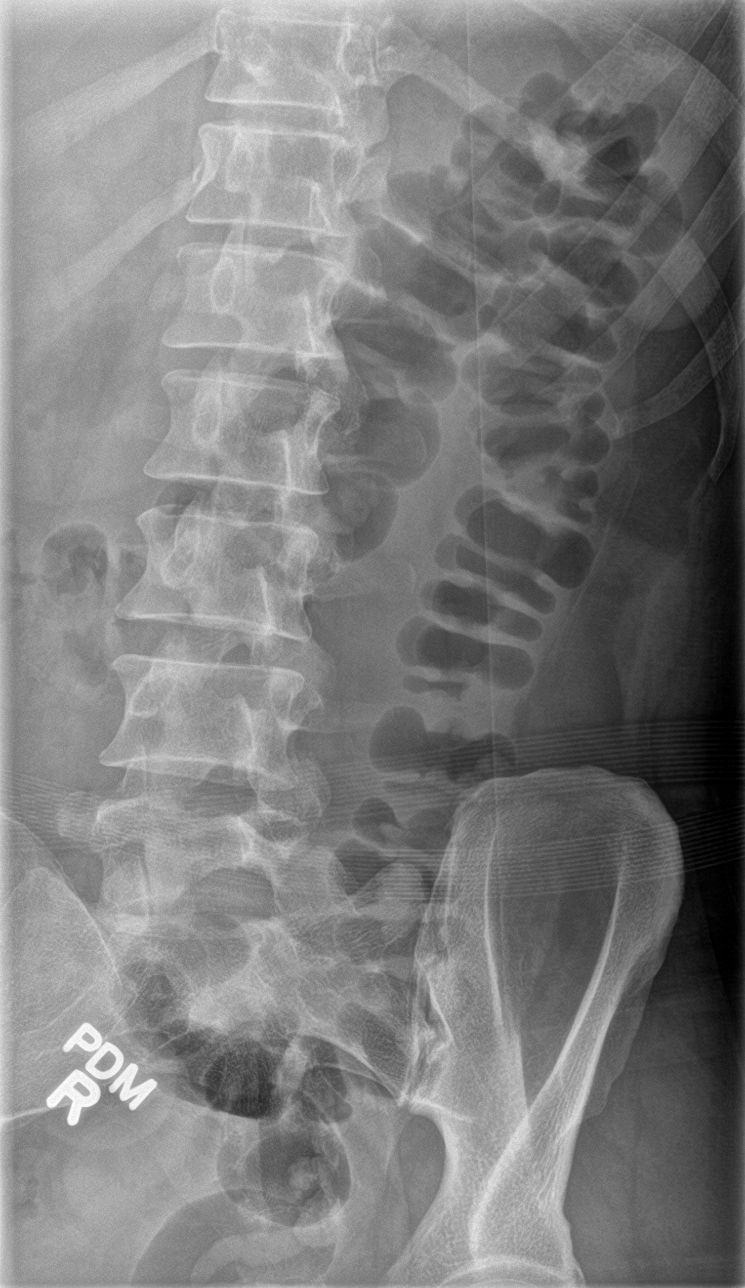

[l-spine obl (2 of 2)]
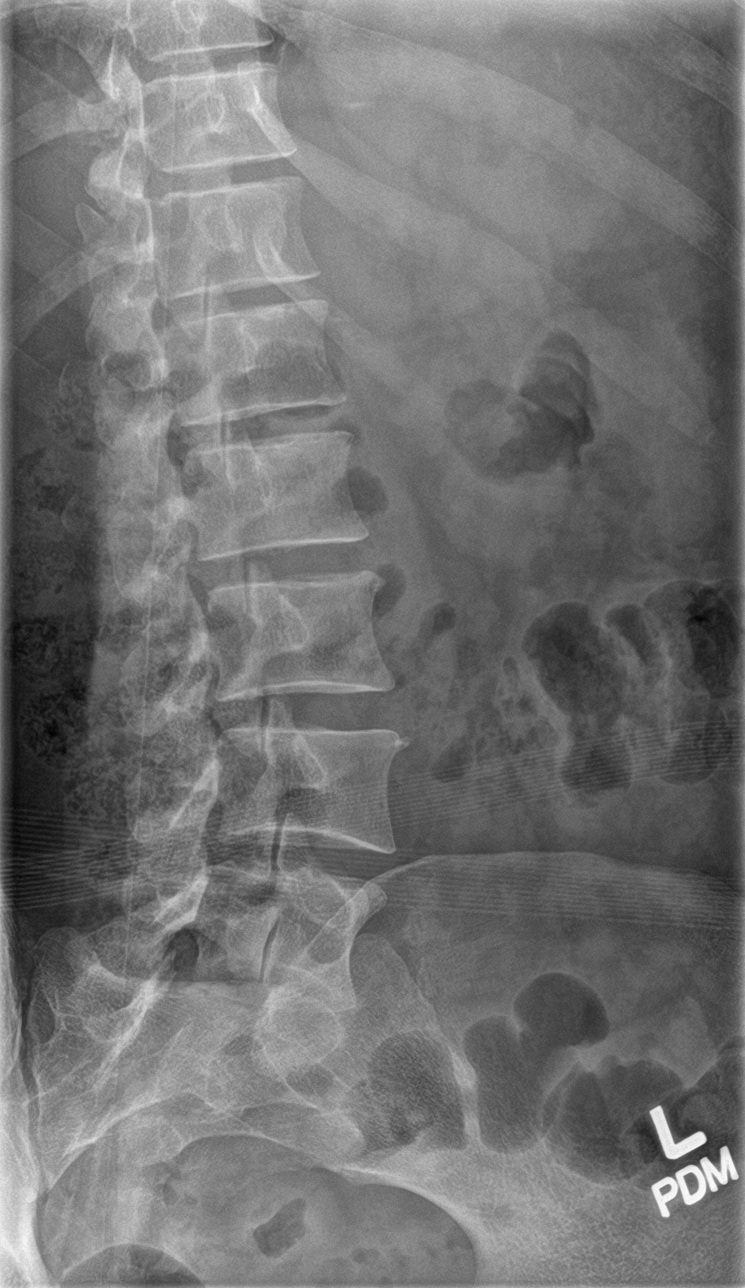

[l-spine lat]
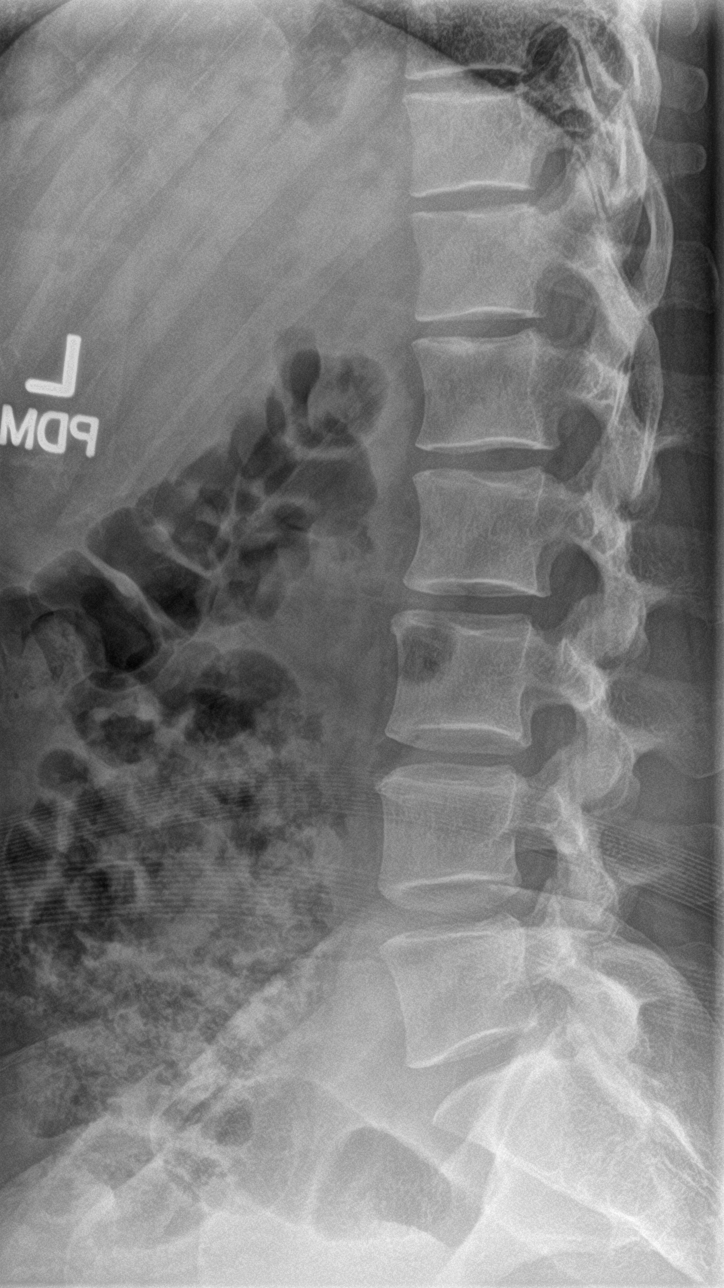

[l-spine spot]
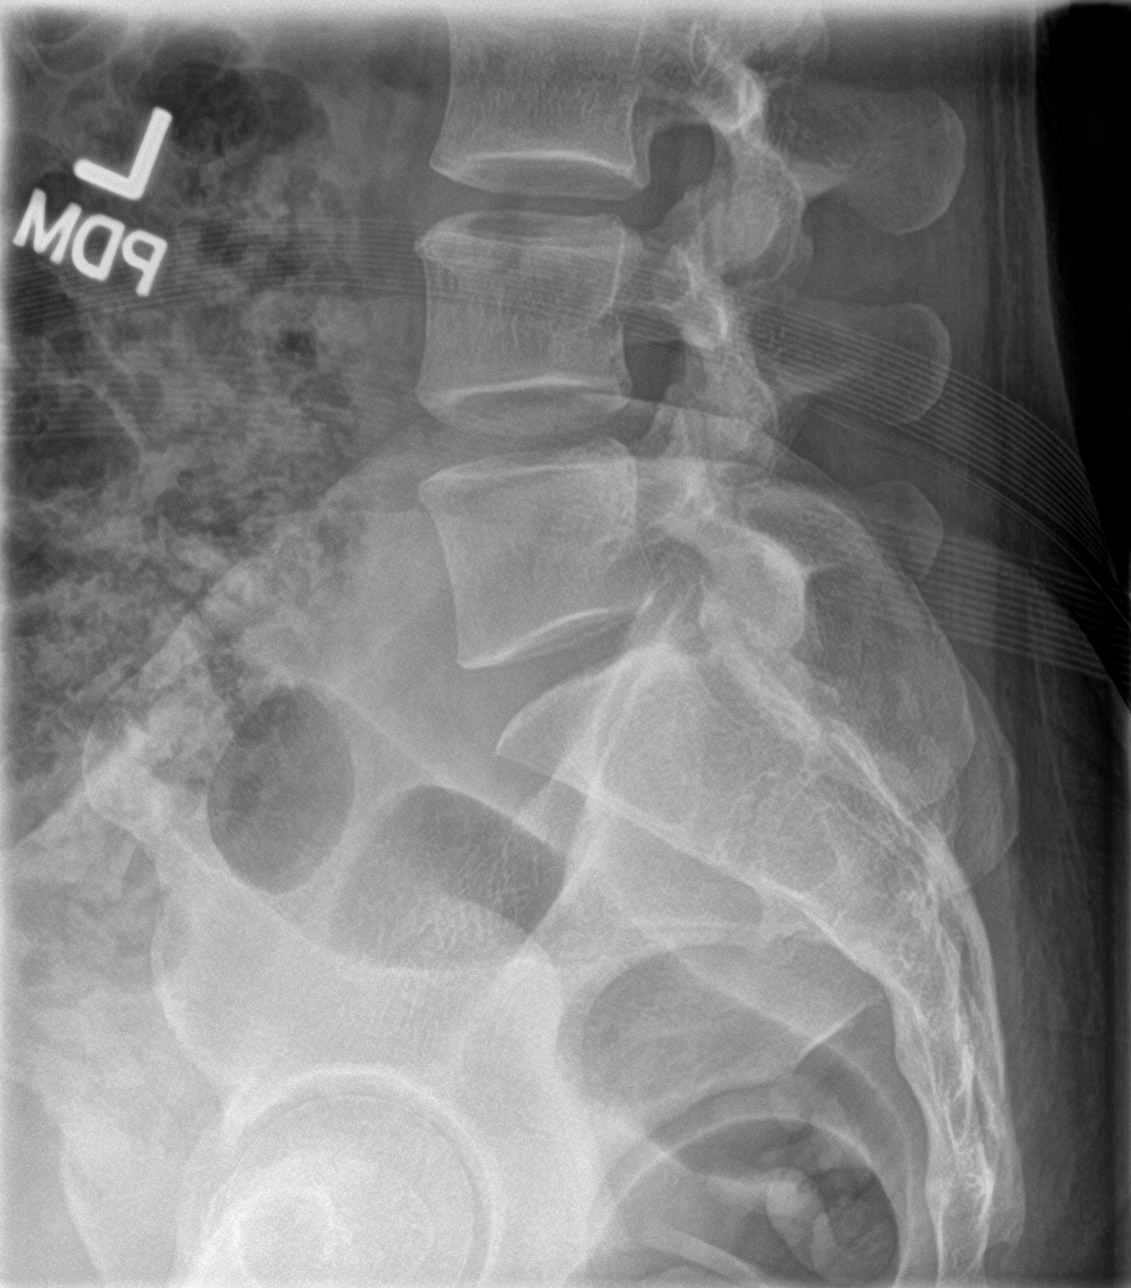

[5 of 5 positions shown; findings below may reference images not displayed]

FINDINGS: There is no evidence of lumbar spine fracture. Alignment is normal.
Intervertebral disc spaces are maintained.
IMPRESSION: Normal lumbar spine.

## 2018-04-21 DIAGNOSIS — Z954 Presence of other heart-valve replacement: Secondary | ICD-10-CM | POA: Diagnosis not present

## 2018-04-21 DIAGNOSIS — Z950 Presence of cardiac pacemaker: Secondary | ICD-10-CM | POA: Diagnosis not present

## 2018-04-21 DIAGNOSIS — I442 Atrioventricular block, complete: Secondary | ICD-10-CM | POA: Diagnosis not present

## 2018-04-21 DIAGNOSIS — Z9889 Other specified postprocedural states: Secondary | ICD-10-CM | POA: Diagnosis not present

## 2018-05-11 DIAGNOSIS — Z45018 Encounter for adjustment and management of other part of cardiac pacemaker: Secondary | ICD-10-CM | POA: Diagnosis not present

## 2018-05-11 DIAGNOSIS — Z950 Presence of cardiac pacemaker: Secondary | ICD-10-CM | POA: Diagnosis not present

## 2018-05-13 ENCOUNTER — Encounter: Payer: Self-pay | Admitting: Family Medicine

## 2018-05-13 NOTE — Progress Notes (Signed)
Kayla Archer, Kayla Archer Phone: [phone] Subjective:     CC: Knee pain.  Found to have a peri-meniscal cyst previously  BJY:NWGNFAOZHYHPI:Subjective  Kayla Archer is a 52 y.o. female coming in with complaint of knee pain.  Has had bone.  Meniscal cyst previously.  Has been doing well but will be going out of the country of walking.  Noticing increasing discomfort and pain recently.  Patient states that she is having pain on the medial aspect of knee that is bothered with incline and stair walking. Pain is felt with elliptical but only after use.     Past Medical History:  Diagnosis Date  . Cartilage tear    rt knee  . Heart murmur   . Medical history non-contributory    see valve rep-  . PONV (postoperative nausea and vomiting)    plus hypotension   Past Surgical History:  Procedure Laterality Date  . 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 INSERT / REPLACE / REMOVE PACEMAKER     at [facility] 04/2017 for complete heart block  . KNEE ARTHROSCOPY Right 12/28/2013   Procedure: ARTHROSCOPY KNEE;  Surgeon: Dannielle HuhSteve Lucey, MD;  [facility];  Service: Orthopedics;  Laterality: Right;  . LABIOPLASTY Bilateral 10/04/2016   Procedure: LABIAL REDUCTION;  Surgeon: Marcelle OverlieMichelle Grewal, MD;  Location: [facility] ORS;  Service: Gynecology;  Laterality: Bilateral;  . TEE WITHOUT CARDIOVERSION N/A 02/08/2017   Procedure: TRANSESOPHAGEAL ECHOCARDIOGRAM (TEE);  Surgeon: Thurmon FairMihai Croitoru, MD;  [facility];  Service: Cardiovascular;  Laterality: N/A;  . VALVE REPLACEMENT  04/2017 [facility]   Social History   Socioeconomic History  . Marital status: Married    Spouse name: Not on file  . Number of children: Not on file  . Years of education: Not on file  . Highest education level: Not on file  Occupational History  . Not on file  Social Needs  . Financial resource strain: Not on file  . Food insecurity:      Worry: Not on file    Inability: Not on file  . Transportation needs:    Medical: Not on file    Non-medical: Not on file  Tobacco Use  . Smoking status: Never Smoker  . Smokeless tobacco: Never Used  Substance and Sexual Activity  . Alcohol use: Yes    Alcohol/week: 1.8 oz    Types: 3 Glasses of wine per week  . Drug use: No  . Sexual activity: Not on file  Lifestyle  . Physical activity:    Days per week: Not on file    Minutes per session: Not on file  . Stress: Not on file  Relationships  . Social connections:    Talks on phone: Not on file    Gets together: Not on file    Attends religious service: Not on file    Active member of club or organization: Not on file    Attends meetings of clubs or organizations: Not on file    Relationship status: Not on file  Other Topics Concern  . Not on file  Social History Narrative  . Not on file   No Known Allergies Family History  Problem Relation Age of Onset  . Hypertension Mother   . Diabetes type II Mother   . Other Mother        NONHODGKINS LYMPHOMA  . Stroke Father   .  Heart attack Father   . Other Sister        PRIMARY PULMONARY HTN  . Healthy Brother   . Hypertension Sister   . Diabetes type II Sister   . Hypertension Sister   . Diabetes type II Sister      Past medical history, social, surgical and family history all reviewed in electronic medical record.  No pertanent information unless stated regarding to the chief complaint.   Review of Systems:Review of systems updated and as accurate as of 05/13/18  No headache, visual changes, nausea, vomiting, diarrhea, constipation, dizziness, abdominal pain, skin rash, fevers, chills, night sweats, weight loss, swollen lymph nodes, body aches, joint swelling, muscle aches, chest pain, shortness of breath, mood changes.   Objective  There were no vitals taken for this visit. Systems examined below as of 05/13/18   General: No apparent distress alert and  oriented x3 mood and affect normal, dressed appropriately.  HEENT: Pupils equal, extraocular movements intact  Respiratory: Patient's speak in full sentences and does not appear short of breath  Cardiovascular: No lower extremity edema, non tender, no erythema  Skin: Warm dry intact with no signs of infection or rash on extremities or on axial skeleton.  Abdomen: Soft nontender  Neuro: Cranial nerves II through XII are intact, neurovascularly intact in all extremities with 2+ DTRs and 2+ pulses.  Lymph: No lymphadenopathy of posterior or anterior cervical chain or axillae bilaterally.  Gait normal with good balance and coordination.  MSK:  Non tender with full range of motion and good stability and symmetric strength and tone of shoulders, elbows, wrist, hip, and ankles bilaterally.  Knee exam shows the patient has tenderness over the medial joint line.  Patient does have arthritic changes with some valgus deformity.  Mild instability noted.  Near full range of motion.  Contralateral knee fairly unremarkable  After informed written and verbal consent, patient was seated on exam table. Right knee was prepped with alcohol swab and utilizing anterolateral approach, patient's right knee space was injected with 4:1  marcaine 0.5%: Kenalog 40mg /dL. Patient tolerated the procedure well without immediate complications.    Impression and Recommendations:     This case required medical decision making of moderate complexity.      Note: This dictation was prepared with Dragon dictation along with smaller phrase technology. Any transcriptional errors that result from this process are unintentional.

## 2018-05-14 ENCOUNTER — Ambulatory Visit: Payer: Self-pay

## 2018-05-14 ENCOUNTER — Ambulatory Visit (INDEPENDENT_AMBULATORY_CARE_PROVIDER_SITE_OTHER): Payer: BLUE CROSS/BLUE SHIELD | Admitting: Family Medicine

## 2018-05-14 ENCOUNTER — Encounter: Payer: Self-pay | Admitting: Family Medicine

## 2018-05-14 VITALS — BP 110/76 | HR 77 | Ht 63.0 in | Wt 134.0 lb

## 2018-05-14 DIAGNOSIS — G8929 Other chronic pain: Secondary | ICD-10-CM

## 2018-05-14 DIAGNOSIS — M1711 Unilateral primary osteoarthritis, right knee: Secondary | ICD-10-CM | POA: Diagnosis not present

## 2018-05-14 DIAGNOSIS — M25561 Pain in right knee: Principal | ICD-10-CM

## 2018-05-14 NOTE — Patient Instructions (Signed)
Good to see you  pennsaid pinkie amount topically 2 times daily as needed.  Ice 20 minutes 2 times daily. Usually after activity and before bed. Have a great trip! See me again in 3-4 weeks IF not better

## 2018-05-14 NOTE — Assessment & Plan Note (Signed)
Patient given injection today.  Tolerated the procedure well.  Discussed icing regimen and home exercises.  Discussed which activities to do which was to avoid.  Patient is to increase activity as tolerated.  Follow-up again 4 weeks

## 2018-07-22 ENCOUNTER — Other Ambulatory Visit: Payer: Self-pay | Admitting: Physician Assistant

## 2018-07-22 DIAGNOSIS — Z1231 Encounter for screening mammogram for malignant neoplasm of breast: Secondary | ICD-10-CM

## 2018-07-29 ENCOUNTER — Other Ambulatory Visit: Payer: Self-pay | Admitting: Cardiology

## 2018-07-29 DIAGNOSIS — Z8679 Personal history of other diseases of the circulatory system: Secondary | ICD-10-CM

## 2018-07-29 DIAGNOSIS — Z9889 Other specified postprocedural states: Principal | ICD-10-CM

## 2018-08-05 ENCOUNTER — Ambulatory Visit
Admission: RE | Admit: 2018-08-05 | Discharge: 2018-08-05 | Disposition: A | Discharge status: Home / Self Care | Payer: BLUE CROSS/BLUE SHIELD | Source: Ambulatory Visit | Attending: Cardiology | Admitting: Cardiology

## 2018-08-05 DIAGNOSIS — I712 Thoracic aortic aneurysm, without rupture: Secondary | ICD-10-CM | POA: Diagnosis not present

## 2018-08-05 DIAGNOSIS — Z9889 Other specified postprocedural states: Principal | ICD-10-CM

## 2018-08-05 DIAGNOSIS — Z8679 Personal history of other diseases of the circulatory system: Secondary | ICD-10-CM

## 2018-08-05 MED ORDER — IOPAMIDOL (ISOVUE-370) INJECTION 76%
75.0000 mL | Freq: Once | INTRAVENOUS | Status: AC | PRN
  Administered 2018-08-05: 75 mL via INTRAVENOUS

## 2018-08-15 ENCOUNTER — Ambulatory Visit: Payer: BLUE CROSS/BLUE SHIELD

## 2018-08-21 DIAGNOSIS — Z953 Presence of xenogenic heart valve: Secondary | ICD-10-CM | POA: Diagnosis not present

## 2018-08-21 DIAGNOSIS — Z950 Presence of cardiac pacemaker: Secondary | ICD-10-CM | POA: Diagnosis not present

## 2018-08-21 DIAGNOSIS — Z1159 Encounter for screening for other viral diseases: Secondary | ICD-10-CM | POA: Diagnosis not present

## 2018-08-21 DIAGNOSIS — Z0000 Encounter for general adult medical examination without abnormal findings: Secondary | ICD-10-CM | POA: Diagnosis not present

## 2018-09-22 DIAGNOSIS — Z1212 Encounter for screening for malignant neoplasm of rectum: Secondary | ICD-10-CM | POA: Diagnosis not present

## 2018-09-22 DIAGNOSIS — Z1211 Encounter for screening for malignant neoplasm of colon: Secondary | ICD-10-CM | POA: Diagnosis not present

## 2018-09-24 DIAGNOSIS — Z1231 Encounter for screening mammogram for malignant neoplasm of breast: Secondary | ICD-10-CM | POA: Diagnosis not present

## 2018-10-03 DIAGNOSIS — Z45018 Encounter for adjustment and management of other part of cardiac pacemaker: Secondary | ICD-10-CM | POA: Diagnosis not present

## 2018-10-03 DIAGNOSIS — Z8679 Personal history of other diseases of the circulatory system: Secondary | ICD-10-CM | POA: Diagnosis not present

## 2018-10-03 DIAGNOSIS — Z950 Presence of cardiac pacemaker: Secondary | ICD-10-CM | POA: Diagnosis not present

## 2018-10-23 DIAGNOSIS — R9389 Abnormal findings on diagnostic imaging of other specified body structures: Secondary | ICD-10-CM | POA: Diagnosis not present

## 2018-10-23 DIAGNOSIS — Z953 Presence of xenogenic heart valve: Secondary | ICD-10-CM | POA: Diagnosis not present

## 2018-10-23 DIAGNOSIS — Z1159 Encounter for screening for other viral diseases: Secondary | ICD-10-CM | POA: Diagnosis not present

## 2018-10-23 DIAGNOSIS — Z950 Presence of cardiac pacemaker: Secondary | ICD-10-CM | POA: Diagnosis not present

## 2018-11-02 NOTE — Progress Notes (Signed)
Tawana ScaleZach Ansley Stanwood D.O. [facility] [address] [place], [place] [address] Phone: [phone] Subjective:   Bruce Donath, Valerie Wolf, am serving as a scribe for Dr. Antoine PrimasZachary Collins Dimaria.  CC: Right knee pain, low back pain  BJY:[id number]:Subjective  Shemia A Terrilee CroakKnight is a 52 y.o. female coming in with complaint of right knee pain. She would like an injection. Had one in May which did alleviate her pain. Patient is having pain behind her knee after sitting for a while.   Is having sciatic nerve pain on the left side. Is wondering if she should have another epidural. Has done yoga recenlty which has helped pain.  Patient states that it is still intermittent.  Patient states that most of the skin also be related to her stress of her job.  Patient has quit her job at this moment and thinks that this will be helpful.  Is going to take some months off before she figures out what next to do.       Past Medical History:  Diagnosis Date  . Cartilage tear    rt knee  . Heart murmur   . Medical history non-contributory    see valve rep-  . PONV (postoperative nausea and vomiting)    plus hypotension   Past Surgical History:  Procedure Laterality Date  . 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 INSERT / REPLACE / REMOVE PACEMAKER     at [facility] 04/2017 for complete heart block  . KNEE ARTHROSCOPY Right 12/28/2013   Procedure: ARTHROSCOPY KNEE;  Surgeon: Dannielle HuhSteve Lucey, MD;  [facility];  Service: Orthopedics;  Laterality: Right;  . LABIOPLASTY Bilateral 10/04/2016   Procedure: LABIAL REDUCTION;  Surgeon: Marcelle OverlieMichelle Grewal, MD;  Location: [facility] ORS;  Service: Gynecology;  Laterality: Bilateral;  . TEE WITHOUT CARDIOVERSION N/A 02/08/2017   Procedure: TRANSESOPHAGEAL ECHOCARDIOGRAM (TEE);  Surgeon: Thurmon FairMihai Croitoru, MD;  [facility];  Service: Cardiovascular;  Laterality: N/A;  . VALVE REPLACEMENT  04/2017 [facility]   Social History   Socioeconomic History  .  Marital status: Married    Spouse name: Not on file  . Number of children: Not on file  . Years of education: Not on file  . Highest education level: Not on file  Occupational History  . Not on file  Social Needs  . Financial resource strain: Not on file  . Food insecurity:    Worry: Not on file    Inability: Not on file  . Transportation needs:    Medical: Not on file    Non-medical: Not on file  Tobacco Use  . Smoking status: Never Smoker  . Smokeless tobacco: Never Used  Substance and Sexual Activity  . Alcohol use: Yes    Alcohol/week: 3.0 standard drinks    Types: 3 Glasses of wine per week  . Drug use: No  . Sexual activity: Not on file  Lifestyle  . Physical activity:    Days per week: Not on file    Minutes per session: Not on file  . Stress: Not on file  Relationships  . Social connections:    Talks on phone: Not on file    Gets together: Not on file    Attends religious service: Not on file    Active member of club or organization: Not on file    Attends meetings of clubs or organizations: Not on file    Relationship status: Not on file  Other Topics Concern  .  Not on file  Social History Narrative  . Not on file   No Known Allergies Family History  Problem Relation Age of Onset  . Hypertension Mother   . Diabetes type II Mother   . Other Mother        NONHODGKINS LYMPHOMA  . Stroke Father   . Heart attack Father   . Other Sister        PRIMARY PULMONARY HTN  . Healthy Brother   . Hypertension Sister   . Diabetes type II Sister   . Hypertension Sister   . Diabetes type II Sister        Current Outpatient Medications (Analgesics):  .  aspirin EC 81 MG tablet, Take 81 mg by mouth 2 (two) times daily.   Current Outpatient Medications (Other):  Marland Kitchen  LORazepam (ATIVAN PO), Take 0.25 mg by mouth.    Past medical history, social, surgical and family history all reviewed in electronic medical record.  No pertanent information unless stated  regarding to the chief complaint.   Review of Systems:  No headache, visual changes, nausea, vomiting, diarrhea, constipation, dizziness, abdominal pain, skin rash, fevers, chills, night sweats, weight loss, swollen lymph nodes, body aches, joint swelling, , chest pain, shortness of breath, mood changes.  Positive muscle aches  Objective  Blood pressure 112/84, pulse 66, height 5\' 3"  (1.6 m), weight 139 lb (63 kg), SpO2 97 %.     General: No apparent distress alert and oriented x3 mood and affect normal, dressed appropriately.  HEENT: Pupils equal, extraocular movements intact  Respiratory: Patient's speak in full sentences and does not appear short of breath  Cardiovascular: No lower extremity edema, non tender, no erythema  Skin: Warm dry intact with no signs of infection or rash on extremities or on axial skeleton.  Vitiligo of the skin noted Abdomen: Soft nontender  Neuro: Cranial nerves II through XII are intact, neurovascularly intact in all extremities with 2+ DTRs and 2+ pulses.  Lymph: No lymphadenopathy of posterior or anterior cervical chain or axillae bilaterally.  Gait mild antalgic MSK:  Non tender with full range of motion and good stability and symmetric strength and tone of shoulders, elbows, wrist, hip, and ankles bilaterally.  Right knee exam shows the patient does have some tenderness over the medial joint line.  Some tightness of the hamstring.  Mild positive McMurray's.  Patient does have mild crepitus of the kneecap.  No significant instability though noted.  Contralateral knee unremarkable  Back exam shows some mild loss of lordosis.  Significant tightness of the hamstrings left greater than right.  No significant radicular symptoms.  Pain over the left sacroiliac joint.  Mild positive Pearlean Brownie on the left side.  Neurovascularly intact with 4+ out of 5 strength symmetric bilaterally  After informed written and verbal consent, patient was seated on exam table. Right knee  was prepped with alcohol swab and utilizing anterolateral approach, patient's right knee space was injected with 4:1  marcaine 0.5%: Kenalog 40mg /dL. Patient tolerated the procedure well without immediate complications.  Osteopathic findings  C2 flexed rotated and side bent right T3 extended rotated and side bent right inhaled third rib T9 extended rotated and side bent left L2 flexed rotated and side bent right Sacrum left on left    Impression and Recommendations:     This case required medical decision making of moderate complexity. The above documentation has been reviewed and is accurate and complete Judi Saa, DO  Note: This dictation was prepared with Dragon dictation along with smaller phrase technology. Any transcriptional errors that result from this process are unintentional.

## 2018-11-04 ENCOUNTER — Encounter

## 2018-11-04 ENCOUNTER — Encounter: Payer: Self-pay | Admitting: Family Medicine

## 2018-11-04 ENCOUNTER — Ambulatory Visit (INDEPENDENT_AMBULATORY_CARE_PROVIDER_SITE_OTHER): Payer: BLUE CROSS/BLUE SHIELD | Admitting: Family Medicine

## 2018-11-04 VITALS — BP 112/84 | HR 66 | Ht 63.0 in | Wt 139.0 lb

## 2018-11-04 DIAGNOSIS — M5416 Radiculopathy, lumbar region: Secondary | ICD-10-CM | POA: Diagnosis not present

## 2018-11-04 DIAGNOSIS — M999 Biomechanical lesion, unspecified: Secondary | ICD-10-CM | POA: Diagnosis not present

## 2018-11-04 DIAGNOSIS — M1711 Unilateral primary osteoarthritis, right knee: Secondary | ICD-10-CM

## 2018-11-04 NOTE — Assessment & Plan Note (Signed)
No significant radicular symptoms today.  Does respond well to manipulation.  Patient will consider the possibility of an epidural.  Patient has responded to them in the past.  Patient does not think she wants to order it at this point but will consider it and call if she changes her mind.  Follow-up with me again in 6 weeks.

## 2018-11-04 NOTE — Assessment & Plan Note (Signed)
Repeat injection given today.  Tolerated the procedure well.  Discussed icing regimen and home exercise.  Discussed which activities to do which wants to avoid.  Patient will increase activity slowly over the course the next several days.  Follow-up with me again in 4 to 8 weeks.

## 2018-11-04 NOTE — Patient Instructions (Addendum)
Great to see you! Happy [place] day! Exercises 3 times a week.

## 2018-11-04 NOTE — Assessment & Plan Note (Signed)
Decision today to treat with OMT was based on Physical Exam  After verbal consent patient was treated with HVLA, ME, FPR techniques in cervical, thoracic, rib, lumbar and sacral areas  Patient tolerated the procedure well with improvement in symptoms  Patient given exercises, stretches and lifestyle modifications  See medications in patient instructions if given  Patient will follow up in 4-6 weeks 

## 2018-11-13 DIAGNOSIS — I442 Atrioventricular block, complete: Secondary | ICD-10-CM | POA: Diagnosis not present

## 2018-11-13 DIAGNOSIS — Z45018 Encounter for adjustment and management of other part of cardiac pacemaker: Secondary | ICD-10-CM | POA: Diagnosis not present

## 2018-11-13 DIAGNOSIS — Z950 Presence of cardiac pacemaker: Secondary | ICD-10-CM | POA: Diagnosis not present

## 2019-02-02 ENCOUNTER — Encounter: Payer: Self-pay | Admitting: Family Medicine

## 2019-02-03 NOTE — Progress Notes (Signed)
[facility] [address] [place], [place] [address] Phone: [phone] Subjective:   Bruce Donath, am serving as a scribe for Dr. Antoine Primas.   CC: Neck and back pain follow-up  TGP:[id number]  Kayla Archer is a 53 y.o. female coming in with complaint of neck and back pain. Last seen on 10/2018 for OMT. Patient states that she has been having sciatic nerve pain on the right lower back and neck spasms. Pain moves from right to left. Had cupping over bilateral traps last night.  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  Some radicular symptoms still noted.  Last time seen 3 months ago for manipulation.  Feels like she was doing better there.  Recently has had some increasing anxiety.  Is doing a renovation of the house and is to restart work in March.  May be contributing to some.  Also has been feeling somewhat under the weather.  No significant chest pain.     Past Medical History:  Diagnosis Date  . Cartilage tear    rt knee  . Heart murmur   . Medical history non-contributory    see valve rep-  . PONV (postoperative nausea and vomiting)    plus hypotension   Past Surgical History:  Procedure Laterality Date  . 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 INSERT / REPLACE / REMOVE PACEMAKER     at [facility] 04/2017 for complete heart block  . KNEE ARTHROSCOPY Right 12/28/2013   Procedure: ARTHROSCOPY KNEE;  Surgeon: Dannielle Huh, MD;  Location: [facility] OR;  Service: Orthopedics;  Laterality: Right;  . LABIOPLASTY Bilateral 10/04/2016   Procedure: LABIAL REDUCTION;  Surgeon: Marcelle Overlie, MD;  Location: [facility] ORS;  Service: Gynecology;  Laterality: Bilateral;  . TEE WITHOUT CARDIOVERSION N/A 02/08/2017   Procedure: TRANSESOPHAGEAL ECHOCARDIOGRAM (TEE);  Surgeon: Thurmon Fair, MD;  Location: [facility] ENDOSCOPY;  Service: Cardiovascular;  Laterality: N/A;  . VALVE  REPLACEMENT  04/2017 [facility]   Social History   Socioeconomic History  . Marital status: Married    Spouse name: Not on file  . Number of children: Not on file  . Years of education: Not on file  . Highest education level: Not on file  Occupational History  . Not on file  Social Needs  . Financial resource strain: Not on file  . Food insecurity:    Worry: Not on file    Inability: Not on file  . Transportation needs:    Medical: Not on file    Non-medical: Not on file  Tobacco Use  . Smoking status: Never Smoker  . Smokeless tobacco: Never Used  Substance and Sexual Activity  . Alcohol use: Yes    Alcohol/week: 3.0 standard drinks    Types: 3 Glasses of wine per week  . Drug use: No  . Sexual activity: Not on file  Lifestyle  . Physical activity:    Days per week: Not on file    Minutes per session: Not on file  . Stress: Not on file  Relationships  . Social connections:    Talks on phone: Not on file    Gets together: Not on file    Attends religious service: Not on file    Active member of club or organization: Not on file    Attends meetings of clubs or organizations: Not on file    Relationship  status: Not on file  Other Topics Concern  . Not on file  Social History Narrative  . Not on file   No Known Allergies Family History  Problem Relation Age of Onset  . Hypertension Mother   . Diabetes type II Mother   . Other Mother        NONHODGKINS LYMPHOMA  . Stroke Father   . Heart attack Father   . Other Sister        PRIMARY PULMONARY HTN  . Healthy Brother   . Hypertension Sister   . Diabetes type II Sister   . Hypertension Sister   . Diabetes type II Sister     Current Outpatient Medications (Endocrine & Metabolic):  .  predniSONE (DELTASONE) 50 MG tablet, Take 1 tablet (50 mg total) by mouth daily.    Current Outpatient Medications (Analgesics):  .  aspirin EC 81 MG tablet, Take 81 mg by mouth 2 (two) times daily.   Current  Outpatient Medications (Other):  Marland Kitchen  LORazepam (ATIVAN PO), Take 0.25 mg by mouth.    Past medical history, social, surgical and family history all reviewed in electronic medical record.  No pertanent information unless stated regarding to the chief complaint.   Review of Systems:  No headache, visual changes, nausea, vomiting, diarrhea, constipation, dizziness, abdominal pain, skin rash, fevers, chills, night sweats, weight loss, swollen lymph nodes, body aches, joint swelling,  chest pain, shortness of breath, mood changes.  Positive muscle aches  Objective  Blood pressure 128/88, pulse 83, height 5\' 3"  (1.6 m), weight 137 lb (62.1 kg), SpO2 97 %.    General: No apparent distress alert and oriented x3 mood and affect normal, dressed appropriately.  HEENT: Pupils equal, extraocular movements intact  Respiratory: Patient's speak in full sentences and does not appear short of breath  Cardiovascular: No lower extremity edema, non tender, no erythema  Skin: Warm dry intact with no signs of infection or rash on extremities or on axial skeleton.  Abdomen: Soft nontender  Neuro: Cranial nerves II through XII are intact, neurovascularly intact in all extremities with 2+ DTRs and 2+ pulses.  Lymph: No lymphadenopathy of posterior or anterior cervical chain or axillae bilaterally.  Gait normal with good balance and coordination.  MSK:  Non tender with full range of motion and good stability and symmetric strength and tone of shoulders, elbows, wrist, hip, knee and ankles bilaterally.   Neck: Inspection loss of lordosis. No palpable stepoffs. Negative Spurling's maneuver. Loss ofSidebending bilaterally Grip strength and sensation normal in bilateral hands Strength good C4 to T1 distribution No sensory change to C4 to T1 Negative Hoffman sign bilaterally Reflexes normal Tightness bilaterally have the trapezius  Back Exam:  Inspection: Loss of lordosis Motion: Flexion 35 deg, Extension 25  deg, Side Bending to 35 deg bilaterally, Rotation to 35 deg bilaterally  SLR laying: Positive left XSLR laying: Negative  Palpable tenderness: Tender to palpation the paraspinal musculature. FABER: Tightness bilaterally. Sensory change: Gross sensation intact to all lumbar and sacral dermatomes.  Reflexes: 2+ at both patellar tendons, 2+ at achilles tendons, Babinski's downgoing.  Strength at foot  Plantar-flexion: 5/5 Dorsi-flexion: 5/5 Eversion: 5/5 Inversion: 5/5  Leg strength  Quad: 5/5 Hamstring: 5/5 Hip flexor: 5/5 Hip abductors: 5/5   Osteopathic findings C2 flexed rotated and side bent right C4 flexed rotated and side bent left T3 extended rotated and side bent right inhaled third rib T9 extended rotated and side bent left L4 flexed rotated and side bent  left  Sacrum right on right     Impression and Recommendations:     This case required medical decision making of moderate complexity. The above documentation has been reviewed and is accurate and complete Judi Saa, DO       Note: This dictation was prepared with Dragon dictation along with smaller phrase technology. Any transcriptional errors that result from this process are unintentional.

## 2019-02-04 ENCOUNTER — Encounter: Payer: Self-pay | Admitting: Family Medicine

## 2019-02-04 ENCOUNTER — Ambulatory Visit: Payer: BLUE CROSS/BLUE SHIELD | Admitting: Family Medicine

## 2019-02-04 VITALS — BP 128/88 | HR 83 | Ht 63.0 in | Wt 137.0 lb

## 2019-02-04 DIAGNOSIS — M5416 Radiculopathy, lumbar region: Secondary | ICD-10-CM | POA: Diagnosis not present

## 2019-02-04 DIAGNOSIS — M999 Biomechanical lesion, unspecified: Secondary | ICD-10-CM

## 2019-02-04 MED ORDER — PREDNISONE 50 MG PO TABS: 50.0000 mg | ORAL_TABLET | Freq: Every day | ORAL | 0 refills | Status: DC

## 2019-02-04 NOTE — Assessment & Plan Note (Signed)
Discussed the radicular symptoms. Patient did respond well to manipulation.  Discussed icing regimen and home exercise will hold that he has been on changes of the prednisone given if any worsening pain.  Some mild tightness noted as well.  Follow-up again in 2 weeks

## 2019-02-04 NOTE — Patient Instructions (Signed)
Good to see you  You should do great  Prednisone daily for 5 days  See me again in 2 weeks

## 2019-02-04 NOTE — Assessment & Plan Note (Signed)
Decision today to treat with OMT was based on Physical Exam  After verbal consent patient was treated with HVLA, ME, FPR techniques in cervical, thoracic, rib, lumbar and sacral areas  Patient tolerated the procedure well with improvement in symptoms  Patient given exercises, stretches and lifestyle modifications  See medications in patient instructions if given  Patient will follow up in 4-6 weeks 

## 2019-02-12 DIAGNOSIS — R5383 Other fatigue: Secondary | ICD-10-CM | POA: Diagnosis not present

## 2019-02-12 DIAGNOSIS — Z950 Presence of cardiac pacemaker: Secondary | ICD-10-CM | POA: Diagnosis not present

## 2019-02-12 DIAGNOSIS — R9389 Abnormal findings on diagnostic imaging of other specified body structures: Secondary | ICD-10-CM | POA: Diagnosis not present

## 2019-02-12 DIAGNOSIS — Z45018 Encounter for adjustment and management of other part of cardiac pacemaker: Secondary | ICD-10-CM | POA: Diagnosis not present

## 2019-02-12 DIAGNOSIS — J029 Acute pharyngitis, unspecified: Secondary | ICD-10-CM | POA: Diagnosis not present

## 2019-02-12 DIAGNOSIS — I442 Atrioventricular block, complete: Secondary | ICD-10-CM | POA: Diagnosis not present

## 2019-02-12 DIAGNOSIS — Z0184 Encounter for antibody response examination: Secondary | ICD-10-CM | POA: Diagnosis not present

## 2019-02-12 DIAGNOSIS — Z952 Presence of prosthetic heart valve: Secondary | ICD-10-CM | POA: Diagnosis not present

## 2019-02-14 ENCOUNTER — Encounter: Payer: Self-pay | Admitting: Cardiology

## 2019-02-14 ENCOUNTER — Telehealth: Payer: Self-pay | Admitting: Cardiology

## 2019-02-14 DIAGNOSIS — E559 Vitamin D deficiency, unspecified: Secondary | ICD-10-CM | POA: Insufficient documentation

## 2019-02-14 DIAGNOSIS — Z9889 Other specified postprocedural states: Secondary | ICD-10-CM

## 2019-02-14 DIAGNOSIS — B258 Other cytomegaloviral diseases: Secondary | ICD-10-CM | POA: Insufficient documentation

## 2019-02-14 DIAGNOSIS — Z8679 Personal history of other diseases of the circulatory system: Secondary | ICD-10-CM | POA: Insufficient documentation

## 2019-02-16 NOTE — Progress Notes (Signed)
Kayla Archer [address] [place], [place] [address] Phone: [phone] Subjective:   Kayla Archer, am serving as a scribe for Dr. Antoine Primas. I  CC: neck pain, back pain, knee pain  WCH:[id number]  Kayla Archer is a 53 y.o. female coming in with complaint of back pain. Pain was worse after she left but the prednisone did help her pain to decrease. Is having left cervical spine achiness. 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  Patient states that overall low back seems to be a little better.  Has responded to manipulation in the past.  No significant radicular symptoms at the moment.  Patient having more right knee pain.  Found to have degenerative arthritis of the knee.  Has attempted different viscosupplementation's in the past with varying degrees of success.  Patient wants to avoid any type of surgical intervention.  Wants to be more active and wants to run a 5K in the near future.  Scheduled for pain night      Past Medical History:  Diagnosis Date  . Cartilage tear    rt knee  . Heart murmur   . Medical history non-contributory    see valve rep-  . PONV (postoperative nausea and vomiting)    plus hypotension   Past Surgical History:  Procedure Laterality Date  . 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 INSERT / REPLACE / REMOVE PACEMAKER     at [facility] 04/2017 for complete heart block  . KNEE ARTHROSCOPY Right 12/28/2013   Procedure: ARTHROSCOPY KNEE;  Surgeon: Dannielle Huh, MD;  Location: [facility] OR;  Service: Orthopedics;  Laterality: Right;  . LABIOPLASTY Bilateral 10/04/2016   Procedure: LABIAL REDUCTION;  Surgeon: Marcelle Overlie, MD;  Location: [facility] ORS;  Service: Gynecology;  Laterality: Bilateral;  . TEE WITHOUT CARDIOVERSION N/A 02/08/2017   Procedure: TRANSESOPHAGEAL ECHOCARDIOGRAM (TEE);  Surgeon: Thurmon Fair, MD;  Location: [facility] ENDOSCOPY;  Service: Cardiovascular;  Laterality: N/A;  . VALVE  REPLACEMENT  04/2017 [facility]   Social History   Socioeconomic History  . Marital status: Married    Spouse name: Not on file  . Number of children: Not on file  . Years of education: Not on file  . Highest education level: Not on file  Occupational History  . Not on file  Social Needs  . Financial resource strain: Not on file  . Food insecurity:    Worry: Not on file    Inability: Not on file  . Transportation needs:    Medical: Not on file    Non-medical: Not on file  Tobacco Use  . Smoking status: Never Smoker  . Smokeless tobacco: Never Used  Substance and Sexual Activity  . Alcohol use: Yes    Alcohol/week: 3.0 standard drinks    Types: 3 Glasses of wine per week  . Drug use: No  . Sexual activity: Not on file  Lifestyle  . Physical activity:    Days per week: Not on file    Minutes per session: Not on file  . Stress: Not on file  Relationships  . Social connections:    Talks on phone: Not on file    Gets together: Not on file    Attends religious service: Not on file    Active member of club or organization: Not on file    Attends meetings of clubs or organizations: Not on file    Relationship status: Not on file  Other Topics Concern  . Not on file  Social History Narrative  . Not on file   No Known Allergies Family History  Problem Relation Age of Onset  . Hypertension Mother   . Diabetes type II Mother   . Other Mother        NONHODGKINS LYMPHOMA  . Stroke Father   . Heart attack Father   . Other Sister        PRIMARY PULMONARY HTN  . Healthy Brother   . Hypertension Sister   . Diabetes type II Sister   . Hypertension Sister   . Diabetes type II Sister     Current Outpatient Medications (Endocrine & Metabolic):  .  predniSONE (DELTASONE) 50 MG tablet, Take 1 tablet (50 mg total) by mouth daily.    Current Outpatient Medications (Analgesics):  .  aspirin EC 81 MG tablet, Take 81 mg by mouth 2 (two) times daily.   Current  Outpatient Medications (Other):  Marland Kitchen  LORazepam (ATIVAN PO), Take 0.25 mg by mouth.    Past medical history, social, surgical and family history all reviewed in electronic medical record.  No pertanent information unless stated regarding to the chief complaint.   Review of Systems:  No headache, visual changes, nausea, vomiting, diarrhea, constipation, dizziness, abdominal pain, skin rash, fevers, chills, night sweats, weight loss, swollen lymph nodes, body aches, joint swelling, muscle aches, chest pain, shortness of breath, mood changes.   Objective  There were no vitals taken for this visit. Systems examined below as of    General: No apparent distress alert and oriented x3 mood and affect normal, dressed appropriately.  HEENT: Pupils equal, extraocular movements intact  Respiratory: Patient's speak in full sentences and does not appear short of breath  Cardiovascular: No lower extremity edema, non tender, no erythema  Skin: Warm dry intact with no signs of infection or rash on extremities or on axial skeleton.  Vitiligo noted Abdomen: Soft nontender  Neuro: Cranial nerves II through XII are intact, neurovascularly intact in all extremities with 2+ DTRs and 2+ pulses.  Lymph: No lymphadenopathy of posterior or anterior cervical chain or axillae bilaterally.  Gait mild antalgic MSK:  Non tender with full range of motion and good stability and symmetric strength and tone of shoulders, elbows, wrist, hip, and ankles bilaterally.  Back exam has some mild loss of lordosis with some tightness in the thoracolumbar lumbosacral areas bilaterally.  Mild positive Pearlean Brownie on the right.  Negative straight leg test.  Neurovascular intact distally.    After informed written and verbal consent, patient was seated on exam table. Right knee was prepped with alcohol swab and utilizing anterolateral approach, patient's right knee space was injected with 4:1  marcaine 0.5%: Kenalog 40mg /dL. Patient tolerated  the procedure well without immediate complications.  Osteopathic findings C2 flexed rotated and side bent right C7 flexed rotated and side bent left T3 extended rotated and side bent right inhaled third rib T5 extended rotated and side bent left L2 flexed rotated and side bent right Sacrum right on right     Impression and Recommendations:     This case required medical decision making of moderate complexity. The above documentation has been reviewed and is accurate and complete Judi Saa, DO       Note: This dictation was prepared with Dragon dictation along with smaller phrase technology. Any transcriptional errors that result from this process are unintentional.

## 2019-02-17 ENCOUNTER — Ambulatory Visit: Payer: BLUE CROSS/BLUE SHIELD | Admitting: Family Medicine

## 2019-02-17 VITALS — BP 122/88 | HR 60 | Ht 63.0 in | Wt 137.0 lb

## 2019-02-17 DIAGNOSIS — M5416 Radiculopathy, lumbar region: Secondary | ICD-10-CM

## 2019-02-17 DIAGNOSIS — M1711 Unilateral primary osteoarthritis, right knee: Secondary | ICD-10-CM | POA: Diagnosis not present

## 2019-02-17 DIAGNOSIS — M999 Biomechanical lesion, unspecified: Secondary | ICD-10-CM | POA: Diagnosis not present

## 2019-02-17 NOTE — Assessment & Plan Note (Signed)
Stable  Discussed HEP  Discussed which activities to do and which ones to avoid.  RTC in 4-6 weeks

## 2019-02-17 NOTE — Assessment & Plan Note (Signed)
Decision today to treat with OMT was based on Physical Exam  After verbal consent patient was treated with HVLA, ME, FPR techniques in cervical, thoracic, rib lumbar and sacral areas  Patient tolerated the procedure well with improvement in symptoms  Patient given exercises, stretches and lifestyle modifications  See medications in patient instructions if given  Patient will follow up in 4-6 weeks 

## 2019-02-17 NOTE — Assessment & Plan Note (Signed)
injected again today  Discussed HEP  Discussed which activities Will get approval with [facility]  If worsening then consider PRP

## 2019-02-17 NOTE — Patient Instructions (Signed)
Good to see you  You should do well  See me again in 4 weeks

## 2019-03-13 ENCOUNTER — Ambulatory Visit: Payer: BLUE CROSS/BLUE SHIELD | Admitting: Family Medicine

## 2019-03-31 ENCOUNTER — Ambulatory Visit: Payer: BLUE CROSS/BLUE SHIELD | Admitting: Family Medicine

## 2019-04-13 ENCOUNTER — Ambulatory Visit: Payer: Self-pay | Admitting: Cardiology

## 2019-04-16 DIAGNOSIS — G4700 Insomnia, unspecified: Secondary | ICD-10-CM | POA: Diagnosis not present

## 2019-04-16 DIAGNOSIS — S61233A Puncture wound without foreign body of left middle finger without damage to nail, initial encounter: Secondary | ICD-10-CM | POA: Diagnosis not present

## 2019-04-16 DIAGNOSIS — I442 Atrioventricular block, complete: Secondary | ICD-10-CM | POA: Diagnosis not present

## 2019-04-16 DIAGNOSIS — T82857S Stenosis of cardiac prosthetic devices, implants and grafts, sequela: Secondary | ICD-10-CM | POA: Diagnosis not present

## 2019-04-16 DIAGNOSIS — Z23 Encounter for immunization: Secondary | ICD-10-CM | POA: Diagnosis not present

## 2019-04-24 DIAGNOSIS — Z03818 Encounter for observation for suspected exposure to other biological agents ruled out: Secondary | ICD-10-CM | POA: Diagnosis not present

## 2019-05-19 ENCOUNTER — Encounter: Payer: Self-pay | Admitting: Family Medicine

## 2019-05-19 ENCOUNTER — Ambulatory Visit: Payer: BC Managed Care – PPO | Admitting: Family Medicine

## 2019-05-19 ENCOUNTER — Other Ambulatory Visit: Payer: Self-pay

## 2019-05-19 VITALS — BP 118/84 | HR 70 | Ht 63.0 in | Wt 136.0 lb

## 2019-05-19 DIAGNOSIS — M1711 Unilateral primary osteoarthritis, right knee: Secondary | ICD-10-CM | POA: Diagnosis not present

## 2019-05-19 DIAGNOSIS — M5416 Radiculopathy, lumbar region: Secondary | ICD-10-CM

## 2019-05-19 DIAGNOSIS — M999 Biomechanical lesion, unspecified: Secondary | ICD-10-CM | POA: Diagnosis not present

## 2019-05-19 NOTE — Progress Notes (Signed)
Tawana ScaleZach Liliya Fullenwider D.O. [facility] [address] [place], [place] [address] Phone: [phone] Subjective:   Bruce Donath, Valerie Wolf, am serving as a scribe for Dr. Antoine PrimasZachary Delrae Hagey.  I'm seeing this patient by the request  of:    CC: Right knee pain, back pain follow-up  XLK:[id number]:Subjective  Kayla Archer is a 53 y.o. female coming in with complaint of right knee pain. States that she is having a lot of pain recently. Was able to walk 5 miles 10 days ago and is now in constant pain. Is having a hard time sleeping. Pain surrounding entire knee.  Patient has had an MRI showing the patient did have some arthritic changes.  Failed all conservative therapy including steroid injections.  Patient is also here for sciatic nerve pain and OMT.  Patient continues to have back pain fairly regularly.  Describes the pain as a dull, throbbing aching pain with intermittent radicular symptoms.  Has responded well to manipulation in the past    Past Medical History:  Diagnosis Date  . Cartilage tear    rt knee  . Heart murmur   . Medical history non-contributory    see valve rep-  . PONV (postoperative nausea and vomiting)    plus hypotension   Past Surgical History:  Procedure Laterality Date  . 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 INSERT / REPLACE / REMOVE PACEMAKER     at [facility] 04/2017 for complete heart block  . KNEE ARTHROSCOPY Right 12/28/2013   Procedure: ARTHROSCOPY KNEE;  Surgeon: Dannielle HuhSteve Lucey, MD;  [facility];  Service: Orthopedics;  Laterality: Right;  . LABIOPLASTY Bilateral 10/04/2016   Procedure: LABIAL REDUCTION;  Surgeon: Marcelle OverlieMichelle Grewal, MD;  Location: [facility];  Service: Gynecology;  Laterality: Bilateral;  . TEE WITHOUT CARDIOVERSION N/A 02/08/2017   Procedure: TRANSESOPHAGEAL ECHOCARDIOGRAM (TEE);  Surgeon: Thurmon FairMihai Croitoru, MD;  [facility];  Service: Cardiovascular;  Laterality: N/A;  . VALVE REPLACEMENT  04/2017 [facility]    Social History   Socioeconomic History  . Marital status: Married    Spouse name: Not on file  . Number of children: Not on file  . Years of education: Not on file  . Highest education level: Not on file  Occupational History  . Not on file  Social Needs  . Financial resource strain: Not on file  . Food insecurity:    Worry: Not on file    Inability: Not on file  . Transportation needs:    Medical: Not on file    Non-medical: Not on file  Tobacco Use  . Smoking status: Never Smoker  . Smokeless tobacco: Never Used  Substance and Sexual Activity  . Alcohol use: Yes    Alcohol/week: 3.0 standard drinks    Types: 3 Glasses of wine per week  . Drug use: No  . Sexual activity: Not on file  Lifestyle  . Physical activity:    Days per week: Not on file    Minutes per session: Not on file  . Stress: Not on file  Relationships  . Social connections:    Talks on phone: Not on file    Gets together: Not on file    Attends religious service: Not on file    Active member of club or organization: Not on file    Attends meetings of clubs or organizations: Not on file    Relationship status: Not on file  Other Topics Concern  .  Not on file  Social History Narrative  . Not on file   No Known Allergies Family History  Problem Relation Age of Onset  . Hypertension Mother   . Diabetes type II Mother   . Other Mother        NONHODGKINS LYMPHOMA  . Stroke Father   . Heart attack Father   . Other Sister        PRIMARY PULMONARY HTN  . Healthy Brother   . Hypertension Sister   . Diabetes type II Sister   . Hypertension Sister   . Diabetes type II Sister        Current Outpatient Medications (Analgesics):  .  aspirin EC 81 MG tablet, Take 81 mg by mouth 2 (two) times daily.   Current Outpatient Medications (Other):  Marland Kitchen  LORazepam (ATIVAN PO), Take 0.25 mg by mouth.    Past medical history, social, surgical and family history all reviewed in electronic medical  record.  No pertanent information unless stated regarding to the chief complaint.   Review of Systems:  No headache, visual changes, nausea, vomiting, diarrhea, constipation, dizziness, abdominal pain, skin rash, fevers, chills, night sweats, weight loss, swollen lymph nodes, body aches,  chest pain, shortness of breath, mood changes.  Positive muscle aches positive joint swelling  Objective  Blood pressure 118/84, pulse 70, height 5\' 3"  (1.6 m), weight 136 lb (61.7 kg), SpO2 97 %.    General: No apparent distress alert and oriented x3 mood and affect normal, dressed appropriately.  HEENT: Pupils equal, extraocular movements intact  Respiratory: Patient's speak in full sentences and does not appear short of breath  Cardiovascular: No lower extremity edema, non tender, no erythema  Skin: Warm dry intact with no signs of infection or rash on extremities or on axial skeleton.  Abdomen: Soft nontender  Neuro: Cranial nerves II through XII are intact, neurovascularly intact in all extremities with 2+ DTRs and 2+ pulses.  Lymph: No lymphadenopathy of posterior or anterior cervical chain or axillae bilaterally.  Gait normal with good balance and coordination.  MSK:  tender with full range of motion and good stability and symmetric strength and tone of shoulders, elbows, wrist, hip, and ankles bilaterally.  Knee Exam shows some crepitus noted.  Patient does have some lateral translocation of the kneecap.  Positive grind test.  Tender to palpation over the medial joint line full range of motion  Back exam shows the patient does have loss of lordosis.  Positive tightness of the hamstring on the right.  Mild radicular symptoms with straight leg test.  Tender to palpation over the right sacroiliac joint   Osteopathic findings C2 flexed rotated and side bent right C6 flexed rotated and side bent left T5 extended rotated and side bent right inhaled rib T9 extended rotated and side bent left L1 flexed  rotated and side bent right Sacrum right on right   After informed written and verbal consent, patient was seated on exam table. Right knee was prepped with alcohol swab and utilizing anterolateral approach, patient's right knee space was injected with 1 g/mL of Monovisc (sodium hyaluronate) in a prefilled syringe was injected easily into the knee through a 22-gauge needle..Patient tolerated the procedure well without immediate complications.     Impression and Recommendations:     This case required medical decision making of moderate complexity. The above documentation has been reviewed and is accurate and complete Lyndal Pulley, DO       Note: This dictation was  prepared with Dragon dictation along with smaller phrase technology. Any transcriptional errors that result from this process are unintentional.

## 2019-05-19 NOTE — Assessment & Plan Note (Signed)
Mild positive straight leg test.  Discussed posture and ergonomics.  Patient will continue with manipulation.  Follow-up again in 4 to 8 weeks

## 2019-05-19 NOTE — Assessment & Plan Note (Signed)
Viscosupplementation given today.  Tolerated the procedure well.  Discussed bracing.  Patient is young and hopefully has many years before replacement is necessary.  Patient will continue with conservative therapy otherwise.  Follow-up with me again in 4 to 8 weeks

## 2019-05-19 NOTE — Patient Instructions (Signed)
Good to see you  Sorry for the delay  Stay active Will take 6 weeks for the knee to feel better See me again in 6 weeks Be safe

## 2019-05-19 NOTE — Assessment & Plan Note (Signed)
Decision today to treat with OMT was based on Physical Exam  After verbal consent patient was treated with HVLA, ME, FPR techniques in cervical, thoracic, rib lumbar and sacral areas  Patient tolerated the procedure well with improvement in symptoms  Patient given exercises, stretches and lifestyle modifications  See medications in patient instructions if given  Patient will follow up in 4-8 weeks 

## 2019-06-02 ENCOUNTER — Telehealth: Payer: Self-pay

## 2019-06-04 ENCOUNTER — Ambulatory Visit: Payer: BLUE CROSS/BLUE SHIELD | Admitting: Cardiology

## 2019-06-09 ENCOUNTER — Encounter: Payer: Self-pay | Admitting: Family Medicine

## 2019-06-10 DIAGNOSIS — Z6824 Body mass index (BMI) 24.0-24.9, adult: Secondary | ICD-10-CM | POA: Diagnosis not present

## 2019-06-10 DIAGNOSIS — Z01419 Encounter for gynecological examination (general) (routine) without abnormal findings: Secondary | ICD-10-CM | POA: Diagnosis not present

## 2019-06-15 ENCOUNTER — Encounter: Payer: Self-pay | Admitting: Family Medicine

## 2019-06-16 ENCOUNTER — Ambulatory Visit: Payer: BC Managed Care – PPO | Admitting: Family Medicine

## 2019-06-16 ENCOUNTER — Ambulatory Visit: Payer: Self-pay

## 2019-06-16 ENCOUNTER — Other Ambulatory Visit: Payer: Self-pay

## 2019-06-16 ENCOUNTER — Encounter: Payer: Self-pay | Admitting: Family Medicine

## 2019-06-16 VITALS — Ht 63.0 in

## 2019-06-16 DIAGNOSIS — G5702 Lesion of sciatic nerve, left lower limb: Secondary | ICD-10-CM | POA: Insufficient documentation

## 2019-06-16 DIAGNOSIS — M999 Biomechanical lesion, unspecified: Secondary | ICD-10-CM | POA: Diagnosis not present

## 2019-06-16 DIAGNOSIS — M1711 Unilateral primary osteoarthritis, right knee: Secondary | ICD-10-CM | POA: Diagnosis not present

## 2019-06-16 DIAGNOSIS — M25552 Pain in left hip: Secondary | ICD-10-CM

## 2019-06-16 NOTE — Patient Instructions (Signed)
Dr. Berenice Primas will call you Injection for piriformis See me in 6-8 weeks for OMT

## 2019-06-16 NOTE — Assessment & Plan Note (Signed)
Patient given injection today.  Tolerated the procedure well.  Discussed icing regimen and home exercise.  Discussed which activities to doing which wants to avoid.  Patient does have some left lumbar radiculopathy symptoms previously but no positive straight leg and only seem to be more of a positive Corky Sox test today.  Patient has had to have nerve root injections previously and we will continue to monitor.  Follow-up again 6 weeks

## 2019-06-16 NOTE — Assessment & Plan Note (Signed)
Decision today to treat with OMT was based on Physical Exam  After verbal consent patient was treated with HVLA, ME, FPR techniques in cervical, thoracic, rib,  lumbar and sacral areas  Patient tolerated the procedure well with improvement in symptoms  Patient given exercises, stretches and lifestyle modifications  See medications in patient instructions if given  Patient will follow up in 4-8 weeks 

## 2019-06-16 NOTE — Assessment & Plan Note (Signed)
Patient does have degenerative changes of the right knee.  I do believe that patient does have a posterior medial meniscal tear.  Patient does have moderate to severe osteoarthritic changes on the medial compartment.  We were discussing the possibility of a PRP injection with her not improving with the viscosupplementation.  Has a peri-meniscal cyst and then sign as well.  I do not believe that this intervention will be helpful and likely will need some type of surgical intervention at this time.  Patient will be referred today for further evaluation.  Discussed with patient about different things such as getting a Doppler to rule out clot but with patient's pain completely gone in the mornings and seems to be worse throughout the day this is more likely.  Patient will follow-up with me again if she has more questions.

## 2019-06-16 NOTE — Progress Notes (Signed)
Tawana ScaleZach Brittiany Wiehe D.O. [facility] [address] [place], [place] [address] Phone: [phone] Subjective:    I'm seeing this patient by the request  of:    CC: Knee pain, back pain,  FAO:[id number]:Subjective  Kayla Archer is a 53 y.o. female coming in with complaint of left hip pain.  Patient has been seen previously and had moderate piriformis syndrome.  Had responded well to an injection greater than 2 years ago.  Feels like it is very similar.  Has had an L5 nerve root injection as well in November 2018 but does not feel it is coming from the back.  Associated more localized into the buttocks area.  Patient has known degenerative disc disease as well as sacroiliac dysfunction of the lower back and has responded fairly well to osteopathic manipulation.  Feels like she needs more manipulation at this moment.  States that she is always sore.  No fevers, chills, any abnormal weight loss  Right knee pain has shown the patient does have a degenerative meniscal tear with a para meniscal cyst.  Patient also has moderate to severe medial compartment arthritis that was also confirmed on MRI.  Patient states viscosupplementation did not make any significant improvement.  If anything she feels like she is worsening.  Was to do a PRP injection but is very hesitant on doing that at the moment.     Past Medical History:  Diagnosis Date  . Cartilage tear    rt knee  . Heart murmur   . Medical history non-contributory    see valve rep-  . PONV (postoperative nausea and vomiting)    plus hypotension   Past Surgical History:  Procedure Laterality Date  . 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 INSERT / REPLACE / REMOVE PACEMAKER     at [facility] 04/2017 for complete heart block  . KNEE ARTHROSCOPY Right 12/28/2013   Procedure: ARTHROSCOPY KNEE;  Surgeon: Dannielle HuhSteve Lucey, MD;  [facility];  Service: Orthopedics;  Laterality: Right;  . LABIOPLASTY Bilateral  10/04/2016   Procedure: LABIAL REDUCTION;  Surgeon: Marcelle OverlieMichelle Grewal, MD;  Location: [facility];  Service: Gynecology;  Laterality: Bilateral;  . TEE WITHOUT CARDIOVERSION N/A 02/08/2017   Procedure: TRANSESOPHAGEAL ECHOCARDIOGRAM (TEE);  Surgeon: Thurmon FairMihai Croitoru, MD;  [facility];  Service: Cardiovascular;  Laterality: N/A;  . VALVE REPLACEMENT  04/2017 [facility]   Social History   Socioeconomic History  . Marital status: Married    Spouse name: Not on file  . Number of children: Not on file  . Years of education: Not on file  . Highest education level: Not on file  Occupational History  . Not on file  Social Needs  . Financial resource strain: Not on file  . Food insecurity    Worry: Not on file    Inability: Not on file  . Transportation needs    Medical: Not on file    Non-medical: Not on file  Tobacco Use  . Smoking status: Never Smoker  . Smokeless tobacco: Never Used  Substance and Sexual Activity  . Alcohol use: Yes    Alcohol/week: 3.0 standard drinks    Types: 3 Glasses of wine per week  . Drug use: No  . Sexual activity: Not on file  Lifestyle  . Physical activity    Days per week: Not on file    Minutes per session: Not on file  . Stress: Not on file  Relationships  . Social Musicianconnections    Talks on phone: Not on file    Gets together: Not on file    Attends religious service: Not on file    Active member of club or organization: Not on file    Attends meetings of clubs or organizations: Not on file    Relationship status: Not on file  Other Topics Concern  . Not on file  Social History Narrative  . Not on file   No Known Allergies Family History  Problem Relation Age of Onset  . Hypertension Mother   . Diabetes type II Mother   . Other Mother        NONHODGKINS LYMPHOMA  . Stroke Father   . Heart attack Father   . Other Sister        PRIMARY PULMONARY HTN  . Healthy Brother   . Hypertension Sister   . Diabetes type II Sister   .  Hypertension Sister   . Diabetes type II Sister        Current Outpatient Medications (Analgesics):  .  aspirin EC 81 MG tablet, Take 81 mg by mouth 2 (two) times daily.   Current Outpatient Medications (Other):  Marland Kitchen.  LORazepam (ATIVAN PO), Take 0.25 mg by mouth.    Past medical history, social, surgical and family history all reviewed in electronic medical record.  No pertanent information unless stated regarding to the chief complaint.   Review of Systems:  No headache, visual changes, nausea, vomiting, diarrhea, constipation, dizziness, abdominal pain, skin rash, fevers, chills, night sweats, weight loss, swollen lymph nodes, body aches, joint swelling, chest pain, shortness of breath, mood changes.  Positive muscle aches  Objective  Height 5\' 3"  (1.6 m).     General: No apparent distress alert and oriented x3 mood and affect normal, dressed appropriately.  HEENT: Pupils equal, extraocular movements intact  Respiratory: Patient's speak in full sentences and does not appear short of breath  Cardiovascular: No lower extremity edema, non tender, no erythema  Skin: Warm dry intact with no signs of infection or rash on extremities or on axial skeleton.  Abdomen: Soft nontender  Neuro: Cranial nerves II through XII are intact, neurovascularly intact in all extremities with 2+ DTRs and 2+ pulses.  Lymph: No lymphadenopathy of posterior or anterior cervical chain or axillae bilaterally.  Gait antalgic MSK:  tender with limited range of motion and good stability and symmetric strength and tone of shoulders, elbows, wrist, hip, and ankles bilaterally.   Right knee exam shows trace effusion noted.  Positive crepitus.  Positive patellar grind.  Severe tenderness of the medial joint line.  Severely tender over the medial joint line McMurray's as well.  Negative Thompson.  No masses appreciated in the popliteal fossa.  Nontender in that area.  Back exam shows mild loss of lordosis.  Patient  does have a tender to palpation paraspinal musculature lumbar spine left greater than right.  Severe tenderness over the left piriformis.  Positive Pearlean BrownieFaber.  Negative straight leg test.  Neurovascularly intact distally.   Osteopathic findings C4 flexed rotated and side bent left C6 flexed rotated and side bent left T3 extended rotated and side bent right inhaled third rib T9 extended rotated and side bent left L4 flexed rotated and side bent right Sacrum left on left   Procedure: Real-time Ultrasound Guided Injection of piriformis tendon sheath left side Device: GE Logiq Q7 Ultrasound guided injection is preferred based studies that show increased duration, increased effect, greater  accuracy, decreased procedural pain, increased response rate, and decreased cost with ultrasound guided versus blind injection.  Verbal informed consent obtained.  Time-out conducted.  Noted no overlying erythema, induration, or other signs of local infection.  Skin prepped in a sterile fashion.  Local anesthesia: Topical Ethyl chloride.  With sterile technique and under real time ultrasound guidance: With a 21-gauge 2 inch needle patient was injected with 0.5 cc of 0.5% Marcaine and 0.5 cc of Kenalog 40 mg/mL within the tendon sheath. Completed without difficulty  Pain immediately resolved suggesting accurate placement of the medication.  Advised to call if fevers/chills, erythema, induration, drainage, or persistent bleeding.  Images permanently stored and available for review in the ultrasound unit.  Impression: Technically successful ultrasound guided injection.    Impression and Recommendations:     This case required medical decision making of moderate complexity. The above documentation has been reviewed and is accurate and complete Lyndal Pulley, DO       Note: This dictation was prepared with Dragon dictation along with smaller phrase technology. Any transcriptional errors that result from  this process are unintentional.

## 2019-06-16 NOTE — Progress Notes (Deleted)
[facility] Kayla Archer, [place] [address] Phone: [phone] Subjective:    I'm seeing this patient by the request  of:    CC:   [id number]  Kayla Archer is a 53 y.o. female coming in with complaint of ***  Onset-  Location Duration-  Character- Aggravating factors- Reliving factors-  Therapies tried-  Severity-     Past Medical History:  Diagnosis Date  . Cartilage tear    rt knee  . Heart murmur   . Medical history non-contributory    see valve rep-  . PONV (postoperative nausea and vomiting)    plus hypotension   Past Surgical History:  Procedure Laterality Date  . 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 INSERT / REPLACE / REMOVE PACEMAKER     at [facility] 04/2017 for complete heart block  . KNEE ARTHROSCOPY Right 12/28/2013   Procedure: ARTHROSCOPY KNEE;  Surgeon: Vickey Huger, MD;  Location: [facility];  Service: Orthopedics;  Laterality: Right;  . LABIOPLASTY Bilateral 10/04/2016   Procedure: LABIAL REDUCTION;  Surgeon: Dian Queen, MD;  Location: [facility];  Service: Gynecology;  Laterality: Bilateral;  . TEE WITHOUT CARDIOVERSION N/A 02/08/2017   Procedure: TRANSESOPHAGEAL ECHOCARDIOGRAM (TEE);  Surgeon: Sanda Klein, MD;  Location: [facility];  Service: Cardiovascular;  Laterality: N/A;  . VALVE REPLACEMENT  04/2017 [facility]   Social History   Socioeconomic History  . Marital status: Married    Spouse name: Not on file  . Number of children: Not on file  . Years of education: Not on file  . Highest education level: Not on file  Occupational History  . Not on file  Social Needs  . Financial resource strain: Not on file  . Food insecurity    Worry: Not on file    Inability: Not on file  . Transportation needs    Medical: Not on file    Non-medical: Not on file  Tobacco Use  . Smoking status: Never Smoker  . Smokeless tobacco: Never Used  Substance and  Sexual Activity  . Alcohol use: Yes    Alcohol/week: 3.0 standard drinks    Types: 3 Glasses of wine per week  . Drug use: No  . Sexual activity: Not on file  Lifestyle  . Physical activity    Days per week: Not on file    Minutes per session: Not on file  . Stress: Not on file  Relationships  . Social Herbalist on phone: Not on file    Gets together: Not on file    Attends religious service: Not on file    Active member of club or organization: Not on file    Attends meetings of clubs or organizations: Not on file    Relationship status: Not on file  Other Topics Concern  . Not on file  Social History Narrative  . Not on file   No Known Allergies Family History  Problem Relation Age of Onset  . Hypertension Mother   . Diabetes type II Mother   . Other Mother        NONHODGKINS LYMPHOMA  . Stroke Father   . Heart attack Father   . Other Sister        PRIMARY PULMONARY HTN  . Healthy Brother   . Hypertension Sister   . Diabetes type II Sister   . Hypertension Sister   . Diabetes type  II Sister        Current Outpatient Medications (Analgesics):  .  aspirin EC 81 MG tablet, Take 81 mg by mouth 2 (two) times daily.   Current Outpatient Medications (Other):  Marland Kitchen.  LORazepam (ATIVAN PO), Take 0.25 mg by mouth.    Past medical history, social, surgical and family history all reviewed in electronic medical record.  No pertanent information unless stated regarding to the chief complaint.   Review of Systems:  No headache, visual changes, nausea, vomiting, diarrhea, constipation, dizziness, abdominal pain, skin rash, fevers, chills, night sweats, weight loss, swollen lymph nodes, body aches, joint swelling, muscle aches, chest pain, shortness of breath, mood changes.   Objective  There were no vitals taken for this visit. Systems examined below as of    General: No apparent distress alert and oriented x3 mood and affect normal, dressed appropriately.   HEENT: Pupils equal, extraocular movements intact  Respiratory: Patient's speak in full sentences and does not appear short of breath  Cardiovascular: No lower extremity edema, non tender, no erythema  Skin: Warm dry intact with no signs of infection or rash on extremities or on axial skeleton.  Abdomen: Soft nontender  Neuro: Cranial nerves II through XII are intact, neurovascularly intact in all extremities with 2+ DTRs and 2+ pulses.  Lymph: No lymphadenopathy of posterior or anterior cervical chain or axillae bilaterally.  Gait normal with good balance and coordination.  MSK:  Non tender with full range of motion and good stability and symmetric strength and tone of shoulders, elbows, wrist, hip, knee and ankles bilaterally.     Impression and Recommendations:     This case required medical decision making of moderate complexity. The above documentation has been reviewed and is accurate and complete Judi SaaZachary M Lansing Sigmon, DO       Note: This dictation was prepared with Dragon dictation along with smaller phrase technology. Any transcriptional errors that result from this process are unintentional.

## 2019-06-18 ENCOUNTER — Encounter: Payer: Self-pay | Admitting: Cardiology

## 2019-06-18 ENCOUNTER — Ambulatory Visit (INDEPENDENT_AMBULATORY_CARE_PROVIDER_SITE_OTHER): Payer: BC Managed Care – PPO | Admitting: Cardiology

## 2019-06-18 ENCOUNTER — Other Ambulatory Visit: Payer: Self-pay

## 2019-06-18 VITALS — BP 133/74 | HR 65 | Ht 64.0 in | Wt 138.0 lb

## 2019-06-18 DIAGNOSIS — Z8679 Personal history of other diseases of the circulatory system: Secondary | ICD-10-CM

## 2019-06-18 DIAGNOSIS — Z45018 Encounter for adjustment and management of other part of cardiac pacemaker: Secondary | ICD-10-CM

## 2019-06-18 DIAGNOSIS — I442 Atrioventricular block, complete: Secondary | ICD-10-CM

## 2019-06-18 DIAGNOSIS — Z9889 Other specified postprocedural states: Secondary | ICD-10-CM

## 2019-06-18 DIAGNOSIS — Z950 Presence of cardiac pacemaker: Secondary | ICD-10-CM | POA: Diagnosis not present

## 2019-06-18 DIAGNOSIS — Z298 Encounter for other specified prophylactic measures: Secondary | ICD-10-CM

## 2019-06-18 DIAGNOSIS — M25561 Pain in right knee: Secondary | ICD-10-CM

## 2019-06-18 DIAGNOSIS — Z954 Presence of other heart-valve replacement: Secondary | ICD-10-CM | POA: Diagnosis not present

## 2019-06-18 DIAGNOSIS — S83241D Other tear of medial meniscus, current injury, right knee, subsequent encounter: Secondary | ICD-10-CM

## 2019-06-18 MED ORDER — DICLOFENAC SODIUM 50 MG PO TBEC: 50.0000 mg | DELAYED_RELEASE_TABLET | Freq: Two times a day (BID) | ORAL | 1 refills | Status: DC | PRN

## 2019-06-18 MED ORDER — DICLOFENAC SODIUM 1 % TD GEL: 4.0000 g | Freq: Four times a day (QID) | TRANSDERMAL | 2 refills | Status: AC

## 2019-06-18 NOTE — Progress Notes (Signed)
Primary Physician/Referring:  Selinda Orion  Patient ID: Kayla Archer, female    DOB: 07/30/1966, 53 y.o.   MRN: [id number]  Chief Complaint  Patient presents with  . Chest Pain  . Follow-up    pacer check    HPI: Kayla Archer  is a 53 y.o. female   h/o bicuspid aortic valve and ascending aortic aneurysm, she also has had ascending aortic aneurysm repair which was very complex done in [facility], the vascular structures and RV very close to the retrosternal space following repair in 2004.   Underwent redo bioprosthetic valve replacement due to degenerative aortic stenosis along with closure of PFO on 04/24/2018. Postoperatively, developed complete heart block leading to placement of a MRI compatible Medtronic dual-chamber pacemaker on 02/28/2017 with . She did have post op A. Flutter and also A. Fib during the first 3 months post op and no recurrence since and she is on ASA only.   She now presents here for 1 year office visit and follow-up, except for right knee pain remains asymptomatic and continues to exercise regularly until recently due to knee pain.  Past Medical History:  Diagnosis Date  . Cartilage tear    rt knee  . Heart murmur   . Medical history non-contributory    see valve rep-  . Pacemaker Medtronic 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 04/30/2017 06/24/2017   Scheduled Remote Pacemaker transmission 3.2.20: DDD PM. Health trends do not demonstrate significant abnormality. No A. Fib. There was 1 high  . PONV (postoperative nausea and vomiting)    plus hypotension    Past Surgical History:  Procedure Laterality Date  . 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 INSERT / REPLACE / REMOVE PACEMAKER     at [facility] 04/2017 for complete heart block  . KNEE ARTHROSCOPY Right 12/28/2013   Procedure: ARTHROSCOPY KNEE;  Surgeon: Vickey Huger, MD;  Location: [facility];  Service: Orthopedics;  Laterality: Right;  .  LABIOPLASTY Bilateral 10/04/2016   Procedure: LABIAL REDUCTION;  Surgeon: Dian Queen, MD;  Location: [place];  Service: Gynecology;  Laterality: Bilateral;  . TEE WITHOUT CARDIOVERSION N/A 02/08/2017   Procedure: TRANSESOPHAGEAL ECHOCARDIOGRAM (TEE);  Surgeon: Sanda Klein, MD;  Location: [place];  Service: Cardiovascular;  Laterality: N/A;  . VALVE REPLACEMENT  04/2017 [facility]    Social History   Socioeconomic History  . Marital status: Married    Spouse name: Not on file  . Number of children: 2  . Years of education: Not on file  . Highest education level: Not on file  Occupational History  . Not on file  Social Needs  . Financial resource strain: Not on file  . Food insecurity    Worry: Not on file    Inability: Not on file  . Transportation needs    Medical: Not on file    Non-medical: Not on file  Tobacco Use  . Smoking status: Never Smoker  . Smokeless tobacco: Never Used  Substance and Sexual Activity  . Alcohol use: Yes    Alcohol/week: 3.0 standard drinks    Types: 3 Glasses of wine per week    Comment: occa  . Drug use: No  . Sexual activity: Not on file  Lifestyle  . Physical activity    Days per week: Not on file    Minutes per session: Not on file  . Stress: Not on file  Relationships  .  Social Herbalist on phone: Not on file    Gets together: Not on file    Attends religious service: Not on file    Active member of club or organization: Not on file    Attends meetings of clubs or organizations: Not on file    Relationship status: Not on file  . Intimate partner violence    Fear of current or ex partner: Not on file    Emotionally abused: Not on file    Physically abused: Not on file    Forced sexual activity: Not on file  Other Topics Concern  . Not on file  Social History Narrative  . Not on file    Review of Systems  Constitution: Negative for chills, decreased appetite, malaise/fatigue and weight gain.   Cardiovascular: Negative for dyspnea on exertion, leg swelling and syncope.  Endocrine: Negative for cold intolerance.  Hematologic/Lymphatic: Bruises/bleeds easily (on asa).  Musculoskeletal: Positive for arthritis (right knee). Negative for joint swelling.  Gastrointestinal: Negative for abdominal pain, anorexia, change in bowel habit, hematochezia and melena.  Neurological: Negative for headaches and light-headedness.  Psychiatric/Behavioral: Negative for depression and substance abuse.  All other systems reviewed and are negative.   Objective  Blood pressure 133/74, pulse 65, height 5' 4" (1.626 m), weight 138 lb (62.6 kg), SpO2 97 %. Body mass index is 23.69 kg/m.    Physical Exam  Constitutional: She is oriented to person, place, and time. She appears well-developed and well-nourished. No distress.  HENT:  Head: Atraumatic.  Eyes: Conjunctivae are normal.  Neck: Neck supple. No JVD present. No thyromegaly present.  Cardiovascular: Normal rate, regular rhythm, normal heart sounds and intact distal pulses. Exam reveals no gallop.  No murmur heard. Pulmonary/Chest: Effort normal and breath sounds normal.  Pacemaker/ICD site noted  in the left infraclavicular fossa.    Abdominal: Soft. Bowel sounds are normal.  Musculoskeletal: Normal range of motion.  Neurological: She is alert and oriented to person, place, and time.  Skin: Skin is warm and dry.  Vitiligo present  Psychiatric: She has a normal mood and affect.   Radiology: No results found.  Laboratory examination:    Labs 02/12/2019: Hb 14.2/HCT 42.3, platelets 248.  BUN 15, creatinine 0.74, EGFR greater than 60 mL, CMP normal.  Serum glucose 98 mg.  TSH normal.  Total cholesterol 173, triglycerides 82, HDL 55, LDL 102.  Non-HDL cholesterol 118.  CMP Latest Ref Rng & Units 02/01/2017 09/07/2016 10/01/2015  Glucose 65 - 99 mg/dL 94 103(H) 129(H)  BUN 6 - 24 mg/dL _0 Creatinine 0.57 - 1.00 mg/dL 0.69 0.66 0.71   Sodium 134 - 144 mmol/L 141 139 137  Potassium 3.5 - 5.2 mmol/L 4.2 4.1 4.0  Chloride 96 - 106 mmol/L 102 106 106  CO2 18 - 29 mmol/L _1 Calcium 8.7 - 10.2 mg/dL 9.4 9.5 9.3  Total Protein 6.5 - 8.1 g/dL - - 6.6  Total Bilirubin 0.3 - 1.2 mg/dL - - 0.2(L)  Alkaline Phos 38 - 126 U/L - - 74  AST 15 - 41 U/L - - 23  ALT 14 - 54 U/L - - 24   CBC Latest Ref Rng & Units 02/01/2017 09/07/2016 10/01/2015  WBC 3.4 - 10.8 x10E3/uL 9.5 8.5 8.6  Hemoglobin 11.1 - 15.9 g/dL 13.5 13.3 13.3  Hematocrit 34.0 - 46.6 % 40.2 41.0 39.4  Platelets 150 - 379 x10E3/uL 250 231 229   Lipid Panel  No  results found for: CHOL, TRIG, HDL, CHOLHDL, VLDL, LDLCALC, LDLDIRECT HEMOGLOBIN A1C No results found for: HGBA1C, MPG TSH No results for input(s): TSH in the last 8760 hours.  Medications   Current Outpatient Medications  Medication Instructions  . diclofenac (VOLTAREN) 50 mg, Oral, 2 times daily PRN  . diclofenac sodium (VOLTAREN) 4 g, Topical, 4 times daily  . LORazepam (ATIVAN PO) 0.25 mg, Oral  . Melatonin 2.5 mg, Oral, Daily PRN  . Vitamin D, Ergocalciferol, (DRISDOL) 1.25 MG (50000 UT) CAPS capsule Weekly    Cardiac Studies:   AV replacement 04/24/2017: Open Aortic valve replacement with CE bioprosthesis size # 23 and PFO repair  - Dr. Vito Backers at [facility]. H/O AV replacement in 2004.   Aortic aneurysm repair and AVR 08/11/2003 with #23 [place]- [employer] bovine pericardial heterograft and ascending aorta graft repair by Dr Nolon Lennert in [facility]  CTA Chest 08/05/18: - Status post aortic valve replacement and surgical repair of ascending thoracic aortic aneurysm. There is no evidence of thoracic aortic dissection or aneurysm currently. - Fatty infiltration of the liver.  Echocardiogram 07/04/2017: Left ventricle cavity is normal in size. Normal global wall motion. Doppler evidence of grade II (pseudonormal) diastolic dysfunction. Diastolic dysfunction findings suggests  elevated LA/LV end diastolic pressure. Calculated EF 67%. Bioprosthetic aortic valve with trace central regurgitation. Normal aortic valve leaflet mobility. Borderline prolapse of the mitral valve leaflets. No significant myxomatous degeneration noted. Mild mitral regurgitation. Mild tricuspid regurgitation. No evidence of pulmonary hypertension. Pacemaker leads are probably present in the RV apex.  Mild pulmonic regurgitation. The aortic root is normal in size. The aortic root repair shadow is noted.  Assessment   1. Encounter for care of pacemaker  2. Heart block AV complete ([place])  3. Pacemaker Medtronic 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 04/30/2017  4. H/O aortic valve replacement with tissue graft - CT ANGIO CHEST AORTA W/CM &/OR WO/CM; Future  5. H/O thoracic aortic aneurysm repair - CT ANGIO CHEST AORTA W/CM &/OR WO/CM; Future  6. SBE (subacute bacterial endocarditis) prophylaxis candidate  7. Acute medial meniscus tear of right knee, subsequent encounter  - diclofenac sodium (VOLTAREN) 1 % GEL; Apply 4 g topically 4 (four) times daily.  Dispense: 400 g; Refill: 2 - diclofenac (VOLTAREN) 50 MG EC tablet; Take 1 tablet (50 mg total) by mouth 2 (two) times daily as needed for moderate pain.  Dispense: 30 tablet; Refill: 1  Scheduled In office pacemaker check 06/18/2019:  Presenting AS-VP. Underlying CHB with no V-escape at 50/min. Last 4 beat VT in 11/2018. No A. Fib. Normal threshold and impedance. AP 32% VP 100%. Longevity > 5 years.  Recommendations:   Patient is on a annual visit and follow-up, of permanent pacemaker and also prosthetic aortic valve.  She has severe right knee pain presently and knee is swollen and also inflamed.  Although she has prosthetic aortic valve and aortic aneurysm repair, short-term diclofenac sodium tablets for a week or so should not matter.  I will also prescribe her ointment as well. No DVT clinically.   I reviewed her pacemaker data, she  has not had any A. fib, she did have postop A. fib and was started on aspirin at that time as she converted back to sinus spontaneously.  Hence advised her to discontinue aspirin as she finds it a nuisance because she continues to have significant ecchymosis.  It has been a 2 years since her aneurysm repair, she had CT angiogram last year  but will need repeat CT angiogram for stability and if aortic root junction with the prosthetic graft is stable, we could certainly opt to do CT angiograms every other year or once every 2 to 3 years.  She is aware that she needs endocarditis prophylaxis.  I reviewed her labs from her PCP, labs within normal limits.  I will see her back in a year, she will continue remote transmission of her pacemaker.  She is pacemaker dependent.  Adrian Prows, MD, [facility] 06/18/2019, 10:21 PM [facility] [place] Pager: [phone] Office: [phone] If no answer Cell [phone]

## 2019-06-21 NOTE — Telephone Encounter (Signed)
Patient aware of normal pacemaker funcion

## 2019-06-25 DIAGNOSIS — M25562 Pain in left knee: Secondary | ICD-10-CM | POA: Diagnosis not present

## 2019-06-25 DIAGNOSIS — M25561 Pain in right knee: Secondary | ICD-10-CM | POA: Diagnosis not present

## 2019-07-13 DIAGNOSIS — M1711 Unilateral primary osteoarthritis, right knee: Secondary | ICD-10-CM | POA: Diagnosis not present

## 2019-08-03 NOTE — Progress Notes (Deleted)
[name] [address] [place], [place] [address] Phone: [phone] Subjective:    I'm seeing this patient by the request  of:    CC:   [id number]  Kayla Archer is a 52 y.o. female coming in with complaint of ***  Onset-  Location Duration-  Character- Aggravating factors- Reliving factors-  Therapies tried-  Severity-     Past Medical History:  Diagnosis Date  . Cartilage tear    rt knee  . Heart murmur   . Medical history non-contributory    see valve rep-  . Pacemaker Medtronic 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 04/30/2017 06/24/2017  . PONV (postoperative nausea and vomiting)    plus hypotension   Past Surgical History:  Procedure Laterality Date  . 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 INSERT / REPLACE / REMOVE PACEMAKER     at [facility] 04/2017 for complete heart block  . KNEE ARTHROSCOPY Right 12/28/2013   Procedure: ARTHROSCOPY KNEE;  Surgeon: Vickey Huger, MD;  Location: [place];  Service: Orthopedics;  Laterality: Right;  . LABIOPLASTY Bilateral 10/04/2016   Procedure: LABIAL REDUCTION;  Surgeon: Dian Queen, MD;  Location: [facility];  Service: Gynecology;  Laterality: Bilateral;  . TEE WITHOUT CARDIOVERSION N/A 02/08/2017   Procedure: TRANSESOPHAGEAL ECHOCARDIOGRAM (TEE);  Surgeon: Sanda Klein, MD;  Location: [place];  Service: Cardiovascular;  Laterality: N/A;  . VALVE REPLACEMENT  04/2017 [facility]   Social History   Socioeconomic History  . Marital status: Married    Spouse name: Not on file  . Number of children: 2  . Years of education: Not on file  . Highest education level: Not on file  Occupational History  . Not on file  Social Needs  . Financial resource strain: Not on file  . Food insecurity    Worry: Not on file    Inability: Not on file  . Transportation needs    Medical: Not on file    Non-medical: Not on file  Tobacco Use  . Smoking  status: Never Smoker  . Smokeless tobacco: Never Used  Substance and Sexual Activity  . Alcohol use: Yes    Alcohol/week: 3.0 standard drinks    Types: 3 Glasses of wine per week    Comment: occa  . Drug use: No  . Sexual activity: Not on file  Lifestyle  . Physical activity    Days per week: Not on file    Minutes per session: Not on file  . Stress: Not on file  Relationships  . Social Herbalist on phone: Not on file    Gets together: Not on file    Attends religious service: Not on file    Active member of club or organization: Not on file    Attends meetings of clubs or organizations: Not on file    Relationship status: Not on file  Other Topics Concern  . Not on file  Social History Narrative  . Not on file   No Known Allergies Family History  Problem Relation Age of Onset  . Hypertension Mother   . Diabetes type II Mother   . Other Mother        NONHODGKINS LYMPHOMA  . Stroke Father   . Heart attack Father   . Other Sister        PRIMARY PULMONARY HTN  . Healthy Brother   . Hypertension Sister   .  Diabetes type II Sister   . Other Sister   . Hypertension Sister   . Diabetes type II Sister        Current Outpatient Medications (Analgesics):  .  diclofenac (VOLTAREN) 50 MG EC tablet, Take 1 tablet (50 mg total) by mouth 2 (two) times daily as needed for moderate pain.   Current Outpatient Medications (Other):  .  diclofenac sodium (VOLTAREN) 1 % GEL, Apply 4 g topically 4 (four) times daily. Marland Kitchen.  LORazepam (ATIVAN PO), Take 0.25 mg by mouth. .  Melatonin 2.5 MG CAPS, Take 2.5 mg by mouth daily as needed. .  Vitamin D, Ergocalciferol, (DRISDOL) 1.25 MG (50000 UT) CAPS capsule, once a week.    Past medical history, social, surgical and family history all reviewed in electronic medical record.  No pertanent information unless stated regarding to the chief complaint.   Review of Systems:  No headache, visual changes, nausea, vomiting, diarrhea,  constipation, dizziness, abdominal pain, skin rash, fevers, chills, night sweats, weight loss, swollen lymph nodes, body aches, joint swelling, muscle aches, chest pain, shortness of breath, mood changes.   Objective  There were no vitals taken for this visit. Systems examined below as of    General: No apparent distress alert and oriented x3 mood and affect normal, dressed appropriately.  HEENT: Pupils equal, extraocular movements intact  Respiratory: Patient's speak in full sentences and does not appear short of breath  Cardiovascular: No lower extremity edema, non tender, no erythema  Skin: Warm dry intact with no signs of infection or rash on extremities or on axial skeleton.  Abdomen: Soft nontender  Neuro: Cranial nerves II through XII are intact, neurovascularly intact in all extremities with 2+ DTRs and 2+ pulses.  Lymph: No lymphadenopathy of posterior or anterior cervical chain or axillae bilaterally.  Gait normal with good balance and coordination.  MSK:  Non tender with full range of motion and good stability and symmetric strength and tone of shoulders, elbows, wrist, hip, knee and ankles bilaterally.     Impression and Recommendations:     This case required medical decision making of moderate complexity. The above documentation has been reviewed and is accurate and complete Kayla SaaZachary M Sakai Heinle, DO       Note: This dictation was prepared with Dragon dictation along with smaller phrase technology. Any transcriptional errors that result from this process are unintentional.

## 2019-08-04 ENCOUNTER — Ambulatory Visit: Payer: BC Managed Care – PPO | Admitting: Family Medicine

## 2019-08-04 DIAGNOSIS — Z0289 Encounter for other administrative examinations: Secondary | ICD-10-CM

## 2019-08-07 DIAGNOSIS — M1711 Unilateral primary osteoarthritis, right knee: Secondary | ICD-10-CM | POA: Diagnosis not present

## 2019-08-12 ENCOUNTER — Encounter: Payer: Self-pay | Admitting: Family Medicine

## 2019-09-08 DIAGNOSIS — F4323 Adjustment disorder with mixed anxiety and depressed mood: Secondary | ICD-10-CM | POA: Diagnosis not present

## 2019-09-09 DIAGNOSIS — Z20828 Contact with and (suspected) exposure to other viral communicable diseases: Secondary | ICD-10-CM | POA: Diagnosis not present

## 2019-09-15 DIAGNOSIS — F4323 Adjustment disorder with mixed anxiety and depressed mood: Secondary | ICD-10-CM | POA: Diagnosis not present

## 2019-09-22 DIAGNOSIS — F4323 Adjustment disorder with mixed anxiety and depressed mood: Secondary | ICD-10-CM | POA: Diagnosis not present

## 2019-09-23 DIAGNOSIS — Z950 Presence of cardiac pacemaker: Secondary | ICD-10-CM | POA: Diagnosis not present

## 2019-09-23 DIAGNOSIS — I442 Atrioventricular block, complete: Secondary | ICD-10-CM | POA: Diagnosis not present

## 2019-09-23 DIAGNOSIS — Z45018 Encounter for adjustment and management of other part of cardiac pacemaker: Secondary | ICD-10-CM | POA: Diagnosis not present

## 2019-09-27 ENCOUNTER — Encounter: Payer: Self-pay | Admitting: Cardiology

## 2019-09-27 DIAGNOSIS — Z45018 Encounter for adjustment and management of other part of cardiac pacemaker: Secondary | ICD-10-CM

## 2019-09-27 HISTORY — DX: Encounter for adjustment and management of other part of cardiac pacemaker: Z45.018

## 2019-09-28 DIAGNOSIS — F4323 Adjustment disorder with mixed anxiety and depressed mood: Secondary | ICD-10-CM | POA: Diagnosis not present

## 2019-09-29 DIAGNOSIS — Z1231 Encounter for screening mammogram for malignant neoplasm of breast: Secondary | ICD-10-CM | POA: Diagnosis not present

## 2019-10-06 DIAGNOSIS — F4323 Adjustment disorder with mixed anxiety and depressed mood: Secondary | ICD-10-CM | POA: Diagnosis not present

## 2019-10-12 DIAGNOSIS — N951 Menopausal and female climacteric states: Secondary | ICD-10-CM | POA: Diagnosis not present

## 2019-10-14 DIAGNOSIS — F4323 Adjustment disorder with mixed anxiety and depressed mood: Secondary | ICD-10-CM | POA: Diagnosis not present

## 2019-10-20 DIAGNOSIS — F4323 Adjustment disorder with mixed anxiety and depressed mood: Secondary | ICD-10-CM | POA: Diagnosis not present

## 2019-10-27 DIAGNOSIS — F4323 Adjustment disorder with mixed anxiety and depressed mood: Secondary | ICD-10-CM | POA: Diagnosis not present

## 2019-10-28 ENCOUNTER — Ambulatory Visit
Admission: RE | Admit: 2019-10-28 | Discharge: 2019-10-28 | Disposition: A | Discharge status: Home / Self Care | Payer: BC Managed Care – PPO | Source: Ambulatory Visit | Attending: Cardiology | Admitting: Cardiology

## 2019-10-28 DIAGNOSIS — J984 Other disorders of lung: Secondary | ICD-10-CM | POA: Diagnosis not present

## 2019-10-28 DIAGNOSIS — Z954 Presence of other heart-valve replacement: Secondary | ICD-10-CM

## 2019-10-28 DIAGNOSIS — Z8679 Personal history of other diseases of the circulatory system: Secondary | ICD-10-CM

## 2019-10-28 MED ORDER — IOPAMIDOL (ISOVUE-370) INJECTION 76%
75.0000 mL | Freq: Once | INTRAVENOUS | Status: AC | PRN
  Administered 2019-10-28: 75 mL via INTRAVENOUS

## 2019-10-29 NOTE — Telephone Encounter (Signed)
Please read

## 2019-11-03 DIAGNOSIS — F4323 Adjustment disorder with mixed anxiety and depressed mood: Secondary | ICD-10-CM | POA: Diagnosis not present

## 2019-11-10 DIAGNOSIS — F4323 Adjustment disorder with mixed anxiety and depressed mood: Secondary | ICD-10-CM | POA: Diagnosis not present

## 2019-11-12 DIAGNOSIS — N951 Menopausal and female climacteric states: Secondary | ICD-10-CM | POA: Diagnosis not present

## 2019-11-12 DIAGNOSIS — F4323 Adjustment disorder with mixed anxiety and depressed mood: Secondary | ICD-10-CM | POA: Diagnosis not present

## 2019-11-17 DIAGNOSIS — F4323 Adjustment disorder with mixed anxiety and depressed mood: Secondary | ICD-10-CM | POA: Diagnosis not present

## 2019-11-19 DIAGNOSIS — Z0000 Encounter for general adult medical examination without abnormal findings: Secondary | ICD-10-CM | POA: Diagnosis not present

## 2019-11-19 DIAGNOSIS — M25561 Pain in right knee: Secondary | ICD-10-CM | POA: Diagnosis not present

## 2019-11-19 DIAGNOSIS — E559 Vitamin D deficiency, unspecified: Secondary | ICD-10-CM | POA: Diagnosis not present

## 2019-11-19 DIAGNOSIS — I442 Atrioventricular block, complete: Secondary | ICD-10-CM | POA: Diagnosis not present

## 2019-11-19 DIAGNOSIS — Z6824 Body mass index (BMI) 24.0-24.9, adult: Secondary | ICD-10-CM | POA: Diagnosis not present

## 2019-11-19 DIAGNOSIS — G4700 Insomnia, unspecified: Secondary | ICD-10-CM | POA: Diagnosis not present

## 2019-11-23 DIAGNOSIS — F4323 Adjustment disorder with mixed anxiety and depressed mood: Secondary | ICD-10-CM | POA: Diagnosis not present

## 2019-11-27 DIAGNOSIS — F4323 Adjustment disorder with mixed anxiety and depressed mood: Secondary | ICD-10-CM | POA: Diagnosis not present

## 2019-12-02 DIAGNOSIS — F4323 Adjustment disorder with mixed anxiety and depressed mood: Secondary | ICD-10-CM | POA: Diagnosis not present

## 2019-12-23 DIAGNOSIS — I442 Atrioventricular block, complete: Secondary | ICD-10-CM | POA: Diagnosis not present

## 2019-12-23 DIAGNOSIS — Z45018 Encounter for adjustment and management of other part of cardiac pacemaker: Secondary | ICD-10-CM | POA: Diagnosis not present

## 2019-12-23 DIAGNOSIS — Z950 Presence of cardiac pacemaker: Secondary | ICD-10-CM | POA: Diagnosis not present

## 2020-02-22 ENCOUNTER — Encounter: Payer: Self-pay | Admitting: Family Medicine

## 2020-06-17 ENCOUNTER — Ambulatory Visit: Payer: BC Managed Care – PPO | Admitting: Cardiology

## 2020-06-17 ENCOUNTER — Other Ambulatory Visit: Payer: Self-pay

## 2020-06-17 ENCOUNTER — Encounter: Payer: Self-pay | Admitting: Cardiology

## 2020-06-17 VITALS — BP 134/71 | HR 77 | Resp 16 | Ht 64.0 in | Wt 137.0 lb

## 2020-06-17 DIAGNOSIS — Z45018 Encounter for adjustment and management of other part of cardiac pacemaker: Secondary | ICD-10-CM

## 2020-06-17 DIAGNOSIS — Z9889 Other specified postprocedural states: Secondary | ICD-10-CM

## 2020-06-17 DIAGNOSIS — Z298 Encounter for other specified prophylactic measures: Secondary | ICD-10-CM

## 2020-06-17 DIAGNOSIS — Z950 Presence of cardiac pacemaker: Secondary | ICD-10-CM

## 2020-06-17 DIAGNOSIS — Z8679 Personal history of other diseases of the circulatory system: Secondary | ICD-10-CM

## 2020-06-17 DIAGNOSIS — I442 Atrioventricular block, complete: Secondary | ICD-10-CM

## 2020-06-17 DIAGNOSIS — Z954 Presence of other heart-valve replacement: Secondary | ICD-10-CM

## 2020-06-17 NOTE — Progress Notes (Signed)
Primary Physician/Referring:  Selinda Orion  Patient ID: Kayla Archer, female    DOB: [birth year], 54 y.o.   MRN: [id number]  Chief Complaint  Patient presents with  . AV Replacment  . Pacemaker Check  . Follow-up    1 year    HPI: Kayla Archer  is a 54 y.o. female   h/o bicuspid aortic valve and ascending aortic aneurysm, she also has had ascending aortic aneurysm repair which was very complex done in [facility], the vascular structures and RV very close to the retrosternal space following repair in 2004.   Underwent redo bioprosthetic valve replacement due to degenerative aortic stenosis along with closure of PFO on 04/24/2018. Postoperatively, developed complete heart block leading to placement of a MRI compatible Medtronic dual-chamber pacemaker on 02/28/2017.   She now presents here for 1 year office visit and follow-up, except for right knee pain remains asymptomatic.  Past Medical History:  Diagnosis Date  . Cartilage tear    rt knee  . CHB (complete heart block) ([place]) 04/24/2017   Overview:  History: Post-op  Assessment: CHB with escape rate 50-60's. S/p PPM placement 05.22.2018.   PMW intact. On low-dose BB.   Plan: Continue low dose BB at discharge. Follow EP recommendations at discharge.  . Encounter for care of pacemaker 09/27/2019  . Heart murmur   . Medical history non-contributory    see valve rep-  . Pacemaker Medtronic 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 04/30/2017 06/24/2017  . PONV (postoperative nausea and vomiting)    plus hypotension    Past Surgical History:  Procedure Laterality Date  . 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 INSERT / REPLACE / REMOVE PACEMAKER     at [facility] 04/2017 for complete heart block  . KNEE ARTHROSCOPY Right 12/28/2013   Procedure: ARTHROSCOPY KNEE;  Surgeon: Vickey Huger, MD;  Location: [place];  Service: Orthopedics;  Laterality: Right;  . LABIOPLASTY Bilateral  10/04/2016   Procedure: LABIAL REDUCTION;  Surgeon: Dian Queen, MD;  Location: [facility] [facility];  Service: Gynecology;  Laterality: Bilateral;  . TEE WITHOUT CARDIOVERSION N/A 02/08/2017   Procedure: TRANSESOPHAGEAL ECHOCARDIOGRAM (TEE);  Surgeon: Sanda Klein, MD;  Location: [place];  Service: Cardiovascular;  Laterality: N/A;  . VALVE REPLACEMENT  04/2017 [facility]   Social History   Tobacco Use  . Smoking status: Never Smoker  . Smokeless tobacco: Never Used  Substance Use Topics  . Alcohol use: Yes    Alcohol/week: 3.0 standard drinks    Types: 3 Glasses of wine per week    Comment: occa   Marital Status: Married   Review of Systems  Constitutional: Negative for chills, decreased appetite, malaise/fatigue and weight gain.  Cardiovascular: Negative for dyspnea on exertion, leg swelling and syncope.  Endocrine: Negative for cold intolerance.  Hematologic/Lymphatic: Bruises/bleeds easily (on asa).  Musculoskeletal: Positive for arthritis (right knee). Negative for joint swelling.  Gastrointestinal: Negative for abdominal pain, anorexia, change in bowel habit, hematochezia and melena.  Neurological: Negative for headaches and light-headedness.  Psychiatric/Behavioral: Negative for depression and substance abuse.  All other systems reviewed and are negative.   Objective  Blood pressure 134/71, pulse 77, resp. rate 16, height '5\' 4"'$  (1.626 m), weight 137 lb (62.1 kg), SpO2 96 %. Body mass index is 23.52 kg/m.    Physical Exam Constitutional:      General: She is not in acute distress.    Appearance: She  is well-developed.  HENT:     Head: Atraumatic.  Eyes:     Conjunctiva/sclera: Conjunctivae normal.  Neck:     Thyroid: No thyromegaly.     Vascular: No JVD.  Cardiovascular:     Rate and Rhythm: Normal rate and regular rhythm.     Pulses: Intact distal pulses.     Heart sounds: Normal heart sounds. No murmur heard.  No gallop.   Pulmonary:     Effort: Pulmonary  effort is normal.     Breath sounds: Normal breath sounds.  Abdominal:     General: Bowel sounds are normal.     Palpations: Abdomen is soft.  Musculoskeletal:        General: Normal range of motion.     Cervical back: Neck supple.  Skin:    General: Skin is warm and dry.     Comments: Vitiligo present  Neurological:     Mental Status: She is alert and oriented to person, place, and time.    Radiology: No results found.  Laboratory examination:    Labs 02/12/2019: Hb 14.2/HCT 42.3, platelets 248.  BUN 15, creatinine 0.74, EGFR greater than 60 mL, CMP normal.  Serum glucose 98 mg.  TSH normal.  Total cholesterol 173, triglycerides 82, HDL 55, LDL 102.  Non-HDL cholesterol 118.  CMP Latest Ref Rng & Units 02/01/2017 09/07/2016 10/01/2015  Glucose 65 - 99 mg/dL 94 103(H) 129(H)  BUN 6 - 24 mg/dL _0 Creatinine 0.57 - 1.00 mg/dL 0.69 0.66 0.71  Sodium 134 - 144 mmol/L 141 139 137  Potassium 3.5 - 5.2 mmol/L 4.2 4.1 4.0  Chloride 96 - 106 mmol/L 102 106 106  CO2 18 - 29 mmol/L _1 Calcium 8.7 - 10.2 mg/dL 9.4 9.5 9.3  Total Protein 6.5 - 8.1 g/dL - - 6.6  Total Bilirubin 0.3 - 1.2 mg/dL - - 0.2(L)  Alkaline Phos 38 - 126 U/L - - 74  AST 15 - 41 U/L - - 23  ALT 14 - 54 U/L - - 24   CBC Latest Ref Rng & Units 02/01/2017 09/07/2016 10/01/2015  WBC 3.4 - 10.8 x10E3/uL 9.5 8.5 8.6  Hemoglobin 11.1 - 15.9 g/dL 13.5 13.3 13.3  Hematocrit 34.0 - 46.6 % 40.2 41.0 39.4  Platelets 150 - 379 x10E3/uL 250 231 229   Lipid Panel  No results found for: CHOL, TRIG, HDL, CHOLHDL, VLDL, LDLCALC, LDLDIRECT HEMOGLOBIN A1C No results found for: HGBA1C, MPG TSH No results for input(s): TSH in the last 8760 hours.  Medications   Current Outpatient Medications  Medication Instructions  . LORazepam (ATIVAN PO) 0.25 mg, Oral  . progesterone (PROMETRIUM) 100 mg, Oral, Daily    Cardiac Studies:   AV replacement 04/24/2017: Open Aortic valve replacement with CE bioprosthesis size #  23 and PFO repair  - Dr. Vito Backers at [facility]. H/O AV replacement in 2004.   Aortic aneurysm repair and AVR 08/11/2003 with #23 [place]- Edwards bovine pericardial heterograft and ascending aorta graft repair by Dr Nolon Lennert in [facility]   CTA chest 10/28/2019:  1. Stable appearance of prosthetic aortic valve and ascending aortic graft without evidence of anastomotic pseudoaneurysm or dissection. 2. Hepatic steatosis. 3. Stable well-circumscribed rounded density in the medial and inferior quadrant of the right breast. This was present dating back to even 2004 by CT and given stability is likely a cyst or fibroadenoma. Recommend correlation with prior mammography. No significant change from CTA Chest 08/05/18  Echocardiogram 07/04/2017: Left ventricle cavity is normal in size. Normal global wall motion. Doppler evidence of grade II (pseudonormal) diastolic dysfunction. Diastolic dysfunction findings suggests elevated LA/LV end diastolic pressure. Calculated EF 67%. Bioprosthetic aortic valve with trace central regurgitation. Normal aortic valve leaflet mobility. Borderline prolapse of the mitral valve leaflets. No significant myxomatous degeneration noted. Mild mitral regurgitation. Mild tricuspid regurgitation. No evidence of pulmonary hypertension. Pacemaker leads are probably present in the RV apex.  Mild pulmonic regurgitation. The aortic root is normal in size. The aortic root repair shadow is noted.  Scheduled  In office pacemaker check 06/17/20  Single (S)/Dual (D)/BV: D. Presenting ASVP. Pacemaker dependant:  Yes. Underlying No escape. AP 32%, VP 100%. . AMS Episodes 0.  AT/AF burden 0% .  HVR 2. Longest 16. Latest 05/04/20 EGM - NSVT. Longevity 9.3 Years. Magnet rate: >85%. Lead measurements: Stable. Thoracic impedance: NA. Histogram: Low (L)/normal (N)/high (H)  Normal. Patient activity Good.   Observations: Normal pacemaker function. Changes: None.    Assessment     ICD-10-CM   1. Encounter for care of pacemaker  Z45.018   2. Heart block AV complete (HCC)  I44.2   3. Pacemaker Medtronic 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 04/30/2017  Z95.0   4. H/O aortic valve replacement with tissue graft  Z95.4   5. H/O thoracic aortic aneurysm repair  Z98.890    Z86.79   6. SBE (subacute bacterial endocarditis) prophylaxis candidate  Z29.8      Recommendations:   Kayla Archer  is a 54 y.o. female   h/o bicuspid aortic valve and ascending aortic aneurysm, she also has had ascending aortic aneurysm repair which was very complex done in [facility], the vascular structures and RV very close to the retrosternal space following repair in 2004.   Underwent redo bioprosthetic valve replacement due to degenerative aortic stenosis along with closure of PFO on 04/24/2018. Postoperatively, developed complete heart block leading to placement of a MRI compatible Medtronic dual-chamber pacemaker on 02/28/2017.   Patient is on a annual visit and follow-up, of permanent pacemaker and also prosthetic aortic valve.  She is presently doing well, I reviewed her pacemaker, functioning normally.  Her CT scan done a year ago was stable, we will repeat CT scan again next year, will try to do this every other year. Patient is aware she needs endocarditis prophylaxis.  She is presently not on aspirin due to severe bleeding tendency and also severe ecchymosis that she developed even on 81 mg aspirin.  Otherwise physical examination is unremarkable, I will see her back again in a year.  She will continue remote monitoring of her pacemaker.   Adrian Prows, MD, [facility] 06/19/2020, 12:55 PM Office: [phone]

## 2020-07-07 ENCOUNTER — Other Ambulatory Visit: Payer: Self-pay

## 2020-07-07 ENCOUNTER — Encounter: Payer: Self-pay | Admitting: Family Medicine

## 2020-07-07 ENCOUNTER — Ambulatory Visit: Payer: BC Managed Care – PPO | Admitting: Family Medicine

## 2020-07-07 ENCOUNTER — Ambulatory Visit: Payer: Self-pay

## 2020-07-07 DIAGNOSIS — M1711 Unilateral primary osteoarthritis, right knee: Secondary | ICD-10-CM

## 2020-07-07 DIAGNOSIS — M533 Sacrococcygeal disorders, not elsewhere classified: Secondary | ICD-10-CM | POA: Diagnosis not present

## 2020-07-07 NOTE — Progress Notes (Signed)
Office Visit Note   Patient: Kayla Archer           Date of Birth: 02/02/1966           MRN: [id number] Visit Date: 07/07/2020 Requested by: Teena Irani, PA-C [address] [place],  [place] [address] PCP: Lucila Maine  Subjective: Chief Complaint  Patient presents with  . tailbone pain since 4/21. Dry needling w/Lorraine@ [facility]    HPI: She is here at the request of Lorenda Peck for tailbone pain.  Symptoms started April 21, no definite injury.  Around that time she was having various aches and pains, especially her right knee.  She went to physical therapy in [place] and she improved, but then her back started to spasm in the right lumbar area.  She began seeing Karin Golden and has had some improvement, but she continues to have a pain at the tip of the coccyx.  It is extremely uncomfortable after sitting for a long time such as when she spent 7 hours flying yesterday.  No bowel or bladder dysfunction.  She is using a donut cushion.  She is not having sciatica.  She has a history of L4-5 disc protrusion requiring epidural injection about 3 years ago.  She has done well since then.  Her chronic right knee pain, she has done well with gel injections in the past.  She would like to do that again.                ROS: No fever or chills.  All other systems were reviewed and are negative.  Objective: Vital Signs: There were no vitals taken for this visit.  Physical Exam:  General:  Alert and oriented, in no acute distress. Pulm:  Breathing unlabored. Psy:  Normal mood, congruent affect. Skin: No visible rash. Female chaperone was present. Low back: No tenderness in the midline of the lumbar spine.  No tenderness in the sciatic notch.  She is point tender at the tip of the coccyx.  Straight leg raise negative, lower extremity strength and reflexes are normal. Right knee: 2+ patellofemoral crepitus.  Trace effusion with no warmth.  Pain with patella  compression.  Imaging: XR Sacrum/Coccyx  Result Date: 07/07/2020 X-rays of the sacrum and coccyx reveal degenerative changes in the coccyx.  No sign of stress fracture or neoplasm.  Hip joints are overall well-preserved.  She has calcification near the left acetabulum, possible chronic labrum tear.   Assessment & Plan: 1.  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 -If symptoms persist, then will consider MRI scan followed by injection per Dr. Alvester Morin if indicated.  2.  Right knee osteoarthritis -We will request approval for gel injections.     Procedures: No procedures performed  No notes on file     PMFS History: Patient Active Problem List   Diagnosis Date Noted  . Encounter for care of pacemaker 09/27/2019  . Piriformis syndrome of left side 06/16/2019  . H/O thoracic aortic aneurysm repair 02/14/2019  . Degenerative arthritis of right knee 05/14/2018  . Nontraumatic neck pain 10/25/2017  . Nonallopathic lesion of cervical region 10/25/2017  . Nonallopathic lesion of rib cage 10/25/2017  . Pacemaker Medtronic 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 04/30/2017 06/24/2017  . CHB (complete heart block) (HCC) 04/24/2017  . Prosthetic aortic valve stenosis   . Nonallopathic lesion of lumbosacral region 11/20/2016  . Nonallopathic lesion of sacral region  11/20/2016  . Nonallopathic lesion of thoracic region 11/20/2016  . Left lumbar radiculopathy 11/13/2016  . Mid sternal chest pain 03/31/2014  . Aortic regurgitation 03/31/2014  . S/P aortic valve replacement with bioprosthetic valve 03/31/2014   Past Medical History:  Diagnosis Date  . Cartilage tear    rt knee  . CHB (complete heart block) (HCC) 04/24/2017   Overview:  History: Post-op  Assessment: CHB with escape rate 50-60's. S/p PPM placement 05.22.2018.   PMW intact. On low-dose BB.   Plan: Continue low dose BB at discharge. Follow EP recommendations at discharge.  .  Encounter for care of pacemaker 09/27/2019  . Heart murmur   . Medical history non-contributory    see valve rep-  . Pacemaker Medtronic 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 04/30/2017 06/24/2017  . PONV (postoperative nausea and vomiting)    plus hypotension    Family History  Problem Relation Age of Onset  . Hypertension Mother   . Diabetes type II Mother   . Other Mother        NONHODGKINS LYMPHOMA  . Stroke Father   . Heart attack Father   . Other Sister        PRIMARY PULMONARY HTN  . Healthy Brother   . Hypertension Sister   . Diabetes type II Sister   . Other Sister   . Lung cancer Sister   . Hypertension Sister   . Diabetes type II Sister     Past Surgical History:  Procedure Laterality Date  . 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 INSERT / REPLACE / REMOVE PACEMAKER     at [facility] 04/2017 for complete heart block  . KNEE ARTHROSCOPY Right 12/28/2013   Procedure: ARTHROSCOPY KNEE;  Surgeon: Dannielle Huh, MD;  Location: [facility];  Service: Orthopedics;  Laterality: Right;  . LABIOPLASTY Bilateral 10/04/2016   Procedure: LABIAL REDUCTION;  Surgeon: Marcelle Overlie, MD;  Location: [facility];  Service: Gynecology;  Laterality: Bilateral;  . TEE WITHOUT CARDIOVERSION N/A 02/08/2017   Procedure: TRANSESOPHAGEAL ECHOCARDIOGRAM (TEE);  Surgeon: Thurmon Fair, MD;  Location: [facility];  Service: Cardiovascular;  Laterality: N/A;  . VALVE REPLACEMENT  04/2017 [facility]   Social History   Occupational History  . Not on file  Tobacco Use  . Smoking status: Never Smoker  . Smokeless tobacco: Never Used  Vaping Use  . Vaping Use: Never used  Substance and Sexual Activity  . Alcohol use: Yes    Alcohol/week: 3.0 standard drinks    Types: 3 Glasses of wine per week    Comment: occa  . Drug use: No  . Sexual activity: Not on file

## 2020-07-08 ENCOUNTER — Telehealth: Payer: Self-pay

## 2020-07-08 NOTE — Telephone Encounter (Signed)
Submitted VOB, SynviscOne, right knee. 

## 2020-07-08 NOTE — Progress Notes (Signed)
Noted  

## 2020-07-11 ENCOUNTER — Telehealth: Payer: Self-pay

## 2020-07-11 NOTE — Telephone Encounter (Signed)
Received VOB from synvisc which stated it wasn't a covered medication   I have submitted for VOB for Durolane-right knee to see if it is covered under pt's insurance

## 2020-07-12 ENCOUNTER — Encounter: Payer: Self-pay | Admitting: Family Medicine

## 2020-07-12 ENCOUNTER — Telehealth: Payer: Self-pay

## 2020-07-12 NOTE — Telephone Encounter (Signed)
Pt's insurance doesn't cover gel injections Please advise pt of next steps thank you

## 2020-07-12 NOTE — Telephone Encounter (Signed)
Please advise 

## 2020-12-14 ENCOUNTER — Encounter: Payer: Self-pay | Admitting: Cardiology

## 2020-12-14 ENCOUNTER — Ambulatory Visit: Payer: BC Managed Care – PPO | Admitting: Cardiology

## 2020-12-14 ENCOUNTER — Other Ambulatory Visit: Payer: Self-pay

## 2020-12-14 VITALS — BP 141/73 | HR 69 | Ht 64.0 in | Wt 131.0 lb

## 2020-12-14 DIAGNOSIS — Z9889 Other specified postprocedural states: Secondary | ICD-10-CM

## 2020-12-14 DIAGNOSIS — I712 Thoracic aortic aneurysm, without rupture, unspecified: Secondary | ICD-10-CM

## 2020-12-14 DIAGNOSIS — Z8679 Personal history of other diseases of the circulatory system: Secondary | ICD-10-CM

## 2020-12-14 DIAGNOSIS — Z954 Presence of other heart-valve replacement: Secondary | ICD-10-CM

## 2020-12-14 DIAGNOSIS — R55 Syncope and collapse: Secondary | ICD-10-CM

## 2020-12-14 DIAGNOSIS — Z950 Presence of cardiac pacemaker: Secondary | ICD-10-CM

## 2020-12-14 DIAGNOSIS — M79602 Pain in left arm: Secondary | ICD-10-CM

## 2020-12-14 NOTE — Progress Notes (Unsigned)
Primary Physician/Referring:  Selinda Orion  Patient ID: Kayla Archer, female    DOB: [birth year], 55 y.o.   MRN: [id number]  Chief Complaint  Patient presents with  . left arm pain  . Dizziness  . Follow-up    HPI: Kayla Archer  is a 55 y.o. female   h/o bicuspid aortic valve and ascending aortic aneurysm, she also has had ascending aortic aneurysm repair which was very complex done in [facility], the vascular structures and RV very close to the retrosternal space following repair in 2004.   Underwent redo bioprosthetic valve replacement due to degenerative aortic stenosis along with closure of PFO on 04/24/2018. Postoperatively, developed complete heart block leading to placement of a MRI compatible Medtronic dual-chamber pacemaker on 02/28/2017.   I seen her in July 2021, she presents for an acute visit, yesterday she had returned back to work after holidays, was at [employer] feeling gas and suddenly felt mild dizziness and nearly fainted associated with mild diaphoresis.  She also felt extremely fatigued and weak.  She had called me last evening about her symptoms, I felt it was vasovagal episode and I made an urgent visit for her to see me and to avoid emergency room visit.  She had felt some left arm discomfort as well.  No chest pain, no shortness of breath, no pain in her back, no abdominal discomfort.  Past Medical History:  Diagnosis Date  . Cartilage tear    rt knee  . CHB (complete heart block) ([place]) 04/24/2017   Overview:  History: Post-op  Assessment: CHB with escape rate 50-60's. S/p PPM placement 05.22.2018.   PMW intact. On low-dose BB.   Plan: Continue low dose BB at discharge. Follow EP recommendations at discharge.  . Encounter for care of pacemaker 09/27/2019  . Heart murmur   . Medical history non-contributory    see valve rep-  . Pacemaker Medtronic 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 04/30/2017 06/24/2017  . PONV (postoperative nausea and  vomiting)    plus hypotension    Past Surgical History:  Procedure Laterality Date  . 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 INSERT / REPLACE / REMOVE PACEMAKER     at [facility] 04/2017 for complete heart block  . KNEE ARTHROSCOPY Right 12/28/2013   Procedure: ARTHROSCOPY KNEE;  Surgeon: Vickey Huger, MD;  Location: [place];  Service: Orthopedics;  Laterality: Right;  . LABIOPLASTY Bilateral 10/04/2016   Procedure: LABIAL REDUCTION;  Surgeon: Dian Queen, MD;  Location: [facility];  Service: Gynecology;  Laterality: Bilateral;  . TEE WITHOUT CARDIOVERSION N/A 02/08/2017   Procedure: TRANSESOPHAGEAL ECHOCARDIOGRAM (TEE);  Surgeon: Sanda Klein, MD;  Location: [place];  Service: Cardiovascular;  Laterality: N/A;  . VALVE REPLACEMENT  04/2017 [facility]   Social History   Tobacco Use  . Smoking status: Never Smoker  . Smokeless tobacco: Never Used  Substance Use Topics  . Alcohol use: Yes    Alcohol/week: 3.0 standard drinks    Types: 3 Glasses of wine per week    Comment: occa   Marital Status: Married   Review of Systems  Constitutional: Positive for malaise/fatigue. Negative for chills, decreased appetite and weight gain.  Cardiovascular: Positive for near-syncope. Negative for dyspnea on exertion, leg swelling and syncope.  Endocrine: Negative for cold intolerance.  Hematologic/Lymphatic: Does not bruise/bleed easily.  Musculoskeletal: Positive for arthritis (right knee). Negative for joint swelling.  Gastrointestinal: Negative for  abdominal pain, anorexia, change in bowel habit, hematochezia and melena.  Neurological: Negative for headaches and light-headedness.  Psychiatric/Behavioral: Negative for depression and substance abuse.  All other systems reviewed and are negative.   Objective  Blood pressure (!) 141/73, pulse 69, height 5' 4" (1.626 m), weight 131 lb (59.4 kg), SpO2 99 %. Body mass index is 22.49 kg/m.   Vitals  with BMI 12/14/2020 06/17/2020 06/18/2019  Height 5' 4" 5' 4" 5' 4"  Weight 131 lbs 137 lbs 138 lbs  BMI 22.47 76.2 26.33  Systolic 354 562 563  Diastolic 73 71 74  Pulse 69 77 65    Orthostatic VS for the past 72 hrs (Last 3 readings):  Orthostatic BP Patient Position BP Location Cuff Size Orthostatic Pulse  12/14/20 0909 116/70 Standing Left Arm Normal 76  12/14/20 0908 120/75 Sitting Left Arm Normal 71  12/14/20 0907 111/65 Supine Left Arm Normal 74       Physical Exam Constitutional:      General: She is not in acute distress.    Appearance: She is well-developed.  HENT:     Head: Atraumatic.  Eyes:     Conjunctiva/sclera: Conjunctivae normal.  Neck:     Thyroid: No thyromegaly.     Vascular: No JVD.  Cardiovascular:     Rate and Rhythm: Normal rate and regular rhythm.     Pulses: Intact distal pulses.     Heart sounds: Murmur heard.   Early systolic murmur is present with a grade of 2/6 at the upper right sternal border. No gallop.   Pulmonary:     Effort: Pulmonary effort is normal.     Breath sounds: Normal breath sounds.  Abdominal:     General: Bowel sounds are normal.     Palpations: Abdomen is soft.  Musculoskeletal:        General: Normal range of motion.     Cervical back: Neck supple.  Skin:    General: Skin is warm and dry.     Comments: Vitiligo present  Neurological:     Mental Status: She is alert and oriented to person, place, and time.    Radiology: No results found.  Laboratory examination:   08/30/2020:  BUN 10, creatinine 0.60, EGFR >60 mL, CMP normal. Serum glucose 91 mg.  TSH normal at 2.390.  Total cholesterol 160, triglycerides 106, HDL 53, LDL 88.  Hb 14.2/HCT 42.3, platelets 248. Labs 02/12/2019: Hb 14.2/HCT 42.3, platelets 248.  BUN 15, creatinine 0.74, EGFR greater than 60 mL, CMP normal.  Serum glucose 98 mg.  TSH normal.  Total cholesterol 173, triglycerides 82, HDL 55, LDL 102.  Non-HDL cholesterol 118.   Medications    Current Outpatient Medications on File Prior to Visit  Medication Sig Dispense Refill  . estradiol (ESTRACE) 0.5 MG tablet Take 0.5 mg by mouth daily.    Marland Kitchen LORazepam (ATIVAN PO) Take 0.25 mg by mouth.    . Multiple Vitamins-Minerals (HM MULTIVITAMIN ADULT GUMMY PO) Take by mouth. 1 daily    . progesterone (PROMETRIUM) 100 MG capsule Take 100 mg by mouth daily.     No current facility-administered medications on file prior to visit.     Cardiac Studies:   AV replacement 04/24/2017: Open Aortic valve replacement with CE bioprosthesis size # 23 and PFO repair  - Dr. Vito Backers at [facility]. H/O AV replacement in 2004.   Aortic aneurysm repair and AVR 08/11/2003 with #23 Carpent[place]- Edwards bovine pericardial heterograft and ascending aorta graft  repair by Dr Nolon Lennert in [facility]   CTA chest 10/28/2019:  1. Stable appearance of prosthetic aortic valve and ascending aortic graft without evidence of anastomotic pseudoaneurysm or dissection. 2. Hepatic steatosis. 3. Stable well-circumscribed rounded density in the medial and inferior quadrant of the right breast. This was present dating back to even 2004 by CT and given stability is likely a cyst or fibroadenoma. Recommend correlation with prior mammography. No significant change from CTA Chest 08/05/18   Echocardiogram 07/04/2017: Left ventricle cavity is normal in size. Normal global wall motion. Doppler evidence of grade II (pseudonormal) diastolic dysfunction. Diastolic dysfunction findings suggests elevated LA/LV end diastolic pressure. Calculated EF 67%. Bioprosthetic aortic valve with trace central regurgitation. Normal aortic valve leaflet mobility. Borderline prolapse of the mitral valve leaflets. No significant myxomatous degeneration noted. Mild mitral regurgitation. Mild tricuspid regurgitation. No evidence of pulmonary hypertension. Pacemaker leads are probably present in the RV apex.  Mild pulmonic  regurgitation. The aortic root is normal in size. The aortic root repair shadow is noted.  Scheduled  In office pacemaker check 12/14/20  Single (S)/Dual (D)/BV: D. Presenting ASVP. Pacemaker dependant:  Yes. Underlying No escape. AP 32%, VP 100%. . AMS Episodes 0.  AT/AF burden 0% .  HVR 2. Longest 16. Latest 05/04/20 EGM - NSVT. Longevity 9.3 Years. Magnet rate: >85%. Lead measurements: Stable. Thoracic impedance: NA. Histogram: Low (L)/normal (N)/high (H)  Normal. Patient activity Good.   Observations: Normal pacemaker function. Changes: None.   Unscheduled (Alert) 12/13/2020:  No abnormalities. AP40%, VP 100%. Patient activity good. Histogram normal.  Assessment    1. Vasovagal near syncope   2. Pain of left upper extremity   3. Thoracic aortic aneurysm without rupture ([place])   4. H/O aortic valve replacement with tissue graft   5. H/O thoracic aortic aneurysm repair     Orders Placed This Encounter  Procedures  . CT ANGIO CHEST AORTA W/CM & OR WO/CM    Standing Status:   Future    Standing Expiration Date:   12/14/2021    Order Specific Question:   If indicated for the ordered procedure, I authorize the administration of contrast media per Radiology protocol    Answer:   Yes    Order Specific Question:   Is patient pregnant?    Answer:   No    Order Specific Question:   Preferred imaging location?    Answer:   [facility]  . EKG 12-Lead  . PCV ECHOCARDIOGRAM COMPLETE    Standing Status:   Future    Standing Expiration Date:   12/14/2021   No orders of the defined types were placed in this encounter.  There are no discontinued medications.   Recommendations:   Kayla Archer  is a 55 y.o. female   h/o bicuspid aortic valve and ascending aortic aneurysm, she also has had ascending aortic aneurysm repair which was very complex done in [facility], the vascular structures and RV very close to the retrosternal space following repair in 2004.   Underwent  redo bioprosthetic valve replacement due to degenerative aortic stenosis along with closure of PFO on 04/24/2018. Postoperatively, developed complete heart block leading to placement of a MRI compatible Medtronic dual-chamber pacemaker on 02/28/2017.   She is seen on a urgent basis with an episode of near syncope that happened yesterday on 12/13/2020.  She is negative for orthostasis, no significant change in physical exam, upper extremity pulses are equal, she is now back to feeling herself  except feels a little tired.  EKG reveals ventricularly paced rhythm.  Remote pacemaker transmission revealed no acute abnormality and pacemaker is functioning normally.  I will see her back as previously scheduled in July 2022.  I will repeat her CT angiogram of the chest to follow-up on the aortic aneurysm and aortic aneurysm repair and also repeat an echocardiogram as it has been greater than 3 years since last echocardiogram.  No changes in the medications were done today.  Counterpressure maneuvers and avoidance of fall discussed with the patient.    Adrian Prows, MD, [facility] 12/14/2020, 9:43 AM Office: [phone]

## 2020-12-15 ENCOUNTER — Encounter: Payer: Self-pay | Admitting: Cardiology

## 2021-01-31 NOTE — Telephone Encounter (Signed)
From pt

## 2021-02-06 ENCOUNTER — Ambulatory Visit (INDEPENDENT_AMBULATORY_CARE_PROVIDER_SITE_OTHER): Payer: BC Managed Care – PPO | Admitting: Family Medicine

## 2021-02-06 ENCOUNTER — Encounter: Payer: Self-pay | Admitting: Family Medicine

## 2021-02-06 ENCOUNTER — Other Ambulatory Visit: Payer: Self-pay

## 2021-02-06 DIAGNOSIS — M1711 Unilateral primary osteoarthritis, right knee: Secondary | ICD-10-CM

## 2021-02-06 NOTE — Progress Notes (Signed)
Office Visit Note   Patient: Kayla Archer           Date of Birth: May 02, 1966           MRN: [id number] Visit Date: 02/06/2021 Requested by: Teena Irani, PA-C [address] [place],  [place] [address] PCP: Lucila Maine  Subjective: Chief Complaint  Patient presents with  . Right Knee - Pain, Follow-up    Requests cortisone injection    HPI: She is here with right knee pain.  Worsening pain with underlying osteoarthritis.  She would like a cortisone injection today as she is contemplating purchasing hyaluronic acid injection on her own since insurance does not cover it.  She has had 2 prior gel injections, the first 1 did fine but the second 1 caused a reaction.               ROS:   All other systems were reviewed and are negative.  Objective: Vital Signs: There were no vitals taken for this visit.  Physical Exam:  General:  Alert and oriented, in no acute distress. Pulm:  Breathing unlabored. Psy:  Normal mood, congruent affect. Skin: No erythema Right knee: Trace effusion with no warmth.  Full range of motion, tender on the medial joint line.  Pain but no palpable click with McMurray's.  Imaging: No results found.  Assessment & Plan: 1.  Right knee osteoarthritis -Steroid injection today.  If she chooses to proceed with gel injection, we will do it under ultrasound guidance.     Procedures: Right knee steroid injection: After sterile prep with Betadine, injected 3 cc 0.25% bupivacaine and 6 mg betamethasone from medial midpatellar approach.       PMFS History: Patient Active Problem List   Diagnosis Date Noted  . Encounter for care of pacemaker 09/27/2019  . Piriformis syndrome of left side 06/16/2019  . H/O thoracic aortic aneurysm repair 02/14/2019  . Degenerative arthritis of right knee 05/14/2018  . Nontraumatic neck pain 10/25/2017  . Nonallopathic lesion of cervical region 10/25/2017  . Nonallopathic lesion of rib cage 10/25/2017  .  Pacemaker Medtronic 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 04/30/2017 06/24/2017  . CHB (complete heart block) ([facility]) 04/24/2017  . Prosthetic aortic valve stenosis   . Nonallopathic lesion of lumbosacral region 11/20/2016  . Nonallopathic lesion of sacral region 11/20/2016  . Nonallopathic lesion of thoracic region 11/20/2016  . Left lumbar radiculopathy 11/13/2016  . Mid sternal chest pain 03/31/2014  . Aortic regurgitation 03/31/2014  . S/P aortic valve replacement with bioprosthetic valve 03/31/2014   Past Medical History:  Diagnosis Date  . Cartilage tear    rt knee  . CHB (complete heart block) ([facility]) 04/24/2017   Overview:  History: Post-op  Assessment: CHB with escape rate 50-60's. S/p PPM placement 05.22.2018.   PMW intact. On low-dose BB.   Plan: Continue low dose BB at discharge. Follow EP recommendations at discharge.  . Encounter for care of pacemaker 09/27/2019  . Heart murmur   . Medical history non-contributory    see valve rep-  . Pacemaker Medtronic 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 04/30/2017 06/24/2017  . PONV (postoperative nausea and vomiting)    plus hypotension    Family History  Problem Relation Age of Onset  . Hypertension Mother   . Diabetes type II Mother   . Other Mother        NONHODGKINS LYMPHOMA  . Stroke Father   . Heart attack  Father   . Other Sister        PRIMARY PULMONARY HTN  . Healthy Brother   . Hypertension Sister   . Diabetes type II Sister   . Other Sister   . Lung cancer Sister   . Hypertension Sister   . Diabetes type II Sister     Past Surgical History:  Procedure Laterality Date  . 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 INSERT / REPLACE / REMOVE PACEMAKER     at [facility] 04/2017 for complete heart block  . KNEE ARTHROSCOPY Right 12/28/2013   Procedure: ARTHROSCOPY KNEE;  Surgeon: Dannielle Huh, MD;  Location: [facility];  Service: Orthopedics;  Laterality: Right;  . LABIOPLASTY Bilateral  10/04/2016   Procedure: LABIAL REDUCTION;  Surgeon: Marcelle Overlie, MD;  Location: [facility];  Service: Gynecology;  Laterality: Bilateral;  . TEE WITHOUT CARDIOVERSION N/A 02/08/2017   Procedure: TRANSESOPHAGEAL ECHOCARDIOGRAM (TEE);  Surgeon: Thurmon Fair, MD;  Location: [facility];  Service: Cardiovascular;  Laterality: N/A;  . VALVE REPLACEMENT  04/2017 [facility]   Social History   Occupational History  . Not on file  Tobacco Use  . Smoking status: Never Smoker  . Smokeless tobacco: Never Used  Vaping Use  . Vaping Use: Never used  Substance and Sexual Activity  . Alcohol use: Yes    Alcohol/week: 3.0 standard drinks    Types: 3 Glasses of wine per week    Comment: occa  . Drug use: No  . Sexual activity: Not on file

## 2021-03-28 NOTE — Telephone Encounter (Signed)
Did you already send over the EKG and last pacemaker transmissions?

## 2021-03-28 NOTE — Telephone Encounter (Signed)
From pt

## 2021-04-06 NOTE — Telephone Encounter (Signed)
From Kayla Archer

## 2021-04-14 NOTE — Telephone Encounter (Signed)
I have printed and faxed them.

## 2021-05-09 ENCOUNTER — Telehealth: Payer: Self-pay | Admitting: Family Medicine

## 2021-05-09 ENCOUNTER — Other Ambulatory Visit: Payer: Self-pay

## 2021-05-09 ENCOUNTER — Ambulatory Visit: Payer: BC Managed Care – PPO | Admitting: Family Medicine

## 2021-05-09 DIAGNOSIS — I519 Heart disease, unspecified: Secondary | ICD-10-CM | POA: Insufficient documentation

## 2021-05-09 DIAGNOSIS — M1711 Unilateral primary osteoarthritis, right knee: Secondary | ICD-10-CM

## 2021-05-09 NOTE — Progress Notes (Signed)
I saw and examined the patient with Dr. Marga Hoots and agree with assessment and plan as outlined.    Leaving for [place] in a couple days.  Prior injection gave very good relief, wants another.  Will also request approval for Trivisc injections.   Injection given from medial mid-patellar approach.

## 2021-05-09 NOTE — Progress Notes (Signed)
Office Visit Note   Patient: Kayla Archer           Date of Birth: 1966/05/27           MRN: [id number] Visit Date: 05/09/2021 Requested by: Teena Irani, PA-C [address] [place],  [place] [address] PCP: Lucila Maine  Subjective: Chief Complaint  Patient presents with  . Right Knee - Pain    Requests cortisone injection. Leaving for [place] in 2 days. Interested in getting the 3-injection gel series.    HPI: 55yo F presenting to clinic for right knee injection. States she had tremendous relief after her last injection, and 'It completely changed my life.' She is leaving for [place] soon, and would like to get another injection before she goes, as she intends to be very active. She is otherwise doing well, with no additional concerns.              Objective: Vital Signs: There were no vitals taken for this visit.  Physical Exam:  General:  Alert and oriented, in no acute distress. Pulm:  Breathing unlabored. Psy:  Normal mood, congruent affect. Skin:  Right knee with no bruising, rashes, or erythema. Overlying skin intact.   Normal Gait. Knee with no obvious swelling or deformity.  Tenderness to palpation along right medial patellar facet.   Imaging: None Today.   Assessment & Plan: 55yo F presenting to clinic for Right knee steroid injection prior to leaving on vacation. Would like to try [place], however insurance denied. Will come back for self-pay following her holiday. - Injection performed as described below, which patient tolerated very well. - Aftercare and return precautions discussed. - Patient had no further questions or concerns today.      Procedures: Right Knee Cortisone Injection:  Risks and benefits of procedure discussed, Patient opted to proceed. Verbal Consent obtained.  Timeout performed.  Skin prepped in a sterile fashion with betadine before further cleansing with alcohol. Ethyl Chloride was used for topical analgesia.  Right Knee  was injected with 3cc 1% Lidocaine without epinephrine via the Medial Mid-patellar approach using a 25G, 1.5in needle. Syringe was removed from the needle, and 6mg  betamethasone was then injected into the joint.   Patient tolerated the injection well with no immediate complications. Aftercare instructions were discussed, and patient was given strict return precautions.      PMFS History: Patient Active Problem List   Diagnosis Date Noted  . Heart disease 05/09/2021  . Encounter for care of pacemaker 09/27/2019  . Piriformis syndrome of left side 06/16/2019  . H/O thoracic aortic aneurysm repair 02/14/2019  . Other cytomegaloviral diseases ([facility]) 02/14/2019  . Vitamin D deficiency 02/14/2019  . Degenerative arthritis of right knee 05/14/2018  . Nontraumatic neck pain 10/25/2017  . Nonallopathic lesion of cervical region 10/25/2017  . Nonallopathic lesion of rib cage 10/25/2017  . Hx of Lyme disease 07/15/2017  . Pacemaker Medtronic 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 04/30/2017 06/24/2017  . Atrial flutter ([facility]) 05/02/2017  . Transition of care performed with sharing of clinical summary 04/26/2017  . CHB (complete heart block) ([facility]) 04/24/2017  . PFO (patent foramen ovale) 03/13/2017  . Prosthetic aortic valve stenosis   . Nonallopathic lesion of lumbosacral region 11/20/2016  . Nonallopathic lesion of sacral region 11/20/2016  . Nonallopathic lesion of thoracic region 11/20/2016  . Left lumbar radiculopathy 11/13/2016  . Left knee injury 03/29/2016  . S/P arthroscopy of knee 11/02/2014  . Mid sternal  chest pain 03/31/2014  . Aortic regurgitation 03/31/2014  . S/P aortic valve replacement with bioprosthetic valve 03/31/2014  . Knee joint pain 10/20/2013  . Vitiligo 08/09/2003   Past Medical History:  Diagnosis Date  . Cartilage tear    rt knee  . CHB (complete heart block) ([facility]) 04/24/2017   Overview:  History: Post-op  Assessment: CHB with escape rate 50-60's. S/p PPM  placement 05.22.2018.   PMW intact. On low-dose BB.   Plan: Continue low dose BB at discharge. Follow EP recommendations at discharge.  . Encounter for care of pacemaker 09/27/2019  . Heart murmur   . Medical history non-contributory    see valve rep-  . Pacemaker Medtronic 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 04/30/2017 06/24/2017  . PONV (postoperative nausea and vomiting)    plus hypotension    Family History  Problem Relation Age of Onset  . Hypertension Mother   . Diabetes type II Mother   . Other Mother        NONHODGKINS LYMPHOMA  . Stroke Father   . Heart attack Father   . Other Sister        PRIMARY PULMONARY HTN  . Healthy Brother   . Hypertension Sister   . Diabetes type II Sister   . Other Sister   . Lung cancer Sister   . Hypertension Sister   . Diabetes type II Sister     Past Surgical History:  Procedure Laterality Date  . 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 INSERT / REPLACE / REMOVE PACEMAKER     at [facility] 04/2017 for complete heart block  . KNEE ARTHROSCOPY Right 12/28/2013   Procedure: ARTHROSCOPY KNEE;  Surgeon: Dannielle Huh, MD;  Location[facility];  Service: Orthopedics;  Laterality: Right;  . LABIOPLASTY Bilateral 10/04/2016   Procedure: LABIAL REDUCTION;  Surgeon: Marcelle Overlie, MD;  Location: [facility];  Service: Gynecology;  Laterality: Bilateral;  . TEE WITHOUT CARDIOVERSION N/A 02/08/2017   Procedure: TRANSESOPHAGEAL ECHOCARDIOGRAM (TEE);  Surgeon: Thurmon Fair, MD;  [facility];  Service: Cardiovascular;  Laterality: N/A;  . VALVE REPLACEMENT  04/2017 [facility]   Social History   Occupational History  . Not on file  Tobacco Use  . Smoking status: Never Smoker  . Smokeless tobacco: Never Used  Vaping Use  . Vaping Use: Never used  Substance and Sexual Activity  . Alcohol use: Yes    Alcohol/week: 3.0 standard drinks    Types: 3 Glasses of wine per week    Comment: occa  . Drug use:  No  . Sexual activity: Not on file

## 2021-05-09 NOTE — Telephone Encounter (Signed)
Requesting approval for gel injections for right knee OA. 

## 2021-05-09 NOTE — Telephone Encounter (Signed)
Error

## 2021-05-09 NOTE — Telephone Encounter (Signed)
Noted  

## 2021-05-23 ENCOUNTER — Telehealth: Payer: Self-pay | Admitting: Family Medicine

## 2021-05-23 NOTE — Telephone Encounter (Signed)
Patient called advised the Trivisc was denied 5 months ago that is why she agreed to pay out of pocket $250.00. Patient said she does want the series of 3 injections.  The number to contact patient is [phone]

## 2021-05-23 NOTE — Telephone Encounter (Signed)
Submitted for TriVisc, right knee.  Called and left a VM advising patient that application for TriVisc, right knee has been submitted and that she will receive a call from [employer] to provide payment for gel injection.

## 2021-06-02 ENCOUNTER — Telehealth: Payer: Self-pay

## 2021-06-02 NOTE — Telephone Encounter (Signed)
I called and left voice message. If she needs that late in the day, she can be scheduled one day later (Wednesday), as both times are available. I asked her to call back for me or for April (I will not be in the office this afternoon) if she would like to be rescheduled to Wednesday. Otherwise, the only thing I can do is wait until Monday and Tuesday to see if the patients in those time slots cancel.

## 2021-06-02 NOTE — Telephone Encounter (Signed)
Patient has appointment scheduled on Tuesday, 06/06/2021 at 3:20pm, but would like to come after work at 4pm or 4:20pm. Cb# [phone].  Please advise.  Thank you.  Patient was very upset due to not being called to schedule an appointment when the injections arrived at the office last week.  TriVisc received, patient purchased through [employer]

## 2021-06-02 NOTE — Telephone Encounter (Signed)
Okay and thank you. 

## 2021-06-06 ENCOUNTER — Ambulatory Visit: Payer: BC Managed Care – PPO | Admitting: Family Medicine

## 2021-06-07 ENCOUNTER — Other Ambulatory Visit: Payer: Self-pay

## 2021-06-07 ENCOUNTER — Ambulatory Visit (INDEPENDENT_AMBULATORY_CARE_PROVIDER_SITE_OTHER): Payer: BC Managed Care – PPO | Admitting: Family Medicine

## 2021-06-07 ENCOUNTER — Encounter: Payer: Self-pay | Admitting: Family Medicine

## 2021-06-07 VITALS — Ht 63.0 in | Wt 132.0 lb

## 2021-06-07 DIAGNOSIS — M1711 Unilateral primary osteoarthritis, right knee: Secondary | ICD-10-CM

## 2021-06-07 NOTE — Progress Notes (Signed)
Subjective: She is here for planned right knee Trivisc injections.  She had a good trip to [place].  Objective: No effusion, no warmth or erythema.  Impression: Right knee osteoarthritis  Plan: Injection #1 today, follow-up in a week for the second.  Procedure: Right knee injection: After sterile prep with Betadine, injected 3 cc 1% lidocaine without epinephrine and Trivisc lateral midpatellar approach.

## 2021-06-14 ENCOUNTER — Other Ambulatory Visit: Payer: Self-pay

## 2021-06-14 ENCOUNTER — Ambulatory Visit (INDEPENDENT_AMBULATORY_CARE_PROVIDER_SITE_OTHER): Payer: BC Managed Care – PPO | Admitting: Family Medicine

## 2021-06-14 DIAGNOSIS — M1711 Unilateral primary osteoarthritis, right knee: Secondary | ICD-10-CM

## 2021-06-14 DIAGNOSIS — M25562 Pain in left knee: Secondary | ICD-10-CM

## 2021-06-14 NOTE — Progress Notes (Signed)
Subjective: She is here for planned right knee Trivisc injection #2 for right knee DJD.  She is starting to have some pain in the left knee anteriorly.   Objective: No effusion, no warmth or erythema on the right.  The left knee has 1+ patellofemoral grind with no effusion, slight tenderness medial joint line but it does not reproduce her pain.  Impression: Right knee osteoarthritis.  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   Plan: Injection #2 today, for the right knee, follow-up in a week for the third.  For the left knee she will do straight leg raises for strengthening and consider a patella strap brace.  Procedure: Right knee injection: After sterile prep with Betadine, injected 3 cc 1% lidocaine without epinephrine and Trivisc lateral midpatellar approach.

## 2021-06-19 ENCOUNTER — Other Ambulatory Visit: Payer: Self-pay

## 2021-06-19 ENCOUNTER — Ambulatory Visit: Payer: BC Managed Care – PPO | Admitting: Cardiology

## 2021-06-19 ENCOUNTER — Encounter: Payer: Self-pay | Admitting: Cardiology

## 2021-06-19 VITALS — BP 128/70 | HR 69 | Temp 98.2°F | Resp 17 | Ht 63.0 in | Wt 132.0 lb

## 2021-06-19 DIAGNOSIS — I442 Atrioventricular block, complete: Secondary | ICD-10-CM

## 2021-06-19 DIAGNOSIS — Z950 Presence of cardiac pacemaker: Secondary | ICD-10-CM

## 2021-06-19 DIAGNOSIS — I712 Thoracic aortic aneurysm, without rupture, unspecified: Secondary | ICD-10-CM

## 2021-06-19 DIAGNOSIS — Z954 Presence of other heart-valve replacement: Secondary | ICD-10-CM

## 2021-06-19 DIAGNOSIS — R55 Syncope and collapse: Secondary | ICD-10-CM

## 2021-06-19 DIAGNOSIS — Z8679 Personal history of other diseases of the circulatory system: Secondary | ICD-10-CM

## 2021-06-19 NOTE — Progress Notes (Signed)
Primary Physician/Referring:  Selinda Orion  Patient ID: Kayla Archer, female    DOB: May 15, 1966, 55 y.o.   MRN: [id number]  Chief Complaint  Patient presents with   Follow-up    1 YEAR   AV REPLACEMENT   AORTIC ANEURYSM REPAIR    HPI: Kayla Archer  is a 55 y.o. female   h/o bicuspid aortic valve and ascending aortic aneurysm, she also has had ascending aortic aneurysm repair which was very complex done in [facility] in 2004, the vascular structures and RV very close to the retrosternal space.  Underwent redo bioprosthetic valve replacement due to degenerative aortic stenosis along with closure of PFO on 04/24/2018. Postoperatively, developed complete heart block leading to placement of a MRI compatible Medtronic dual-chamber pacemaker on 02/28/2017.   She presented to me in January 2022 while she was shopping suddenly felt dizzy and nearly fainted and associated with mild diaphoresis.  She felt extremely weak and fatigued following this for whole day.  I felt the episode was vasovagal, and evaluated her then.  She has had couple more episodes since then and last episode was 2 months ago.  She presented herself to be further evaluated at [facility] clinic and was seen there in May 2022 and an echocardiogram and carotid duplex were obtained.  She has multiple questions regarding the test that were performed there.  She has not had any recurrence of syncope or near syncope over the past 2 months.   Past Medical History:  Diagnosis Date   Cartilage tear    rt knee   CHB (complete heart block) ([place]) 04/24/2017   Overview:  History: Post-op  Assessment: CHB with escape rate 50-60's. S/p PPM placement 05.22.2018.   PMW intact. On low-dose BB.   Plan: Continue low dose BB at discharge. Follow EP recommendations at discharge.   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  dual chamber in situ 04/30/2017 06/24/2017   PONV (postoperative nausea and vomiting)    plus hypotension    Past Surgical History:  Procedure Laterality Date   CARDIAC VALVE REPLACEMENT  04   thoracic aortic aneurysm plus valve   INSERT / REPLACE / REMOVE PACEMAKER     at [facility] 04/2017 for complete heart block   KNEE ARTHROSCOPY Right 12/28/2013   Procedure: ARTHROSCOPY KNEE;  Surgeon: Vickey Huger, MD;  Location: [facility];  Service: Orthopedics;  Laterality: Right;   LABIOPLASTY Bilateral 10/04/2016   Procedure: LABIAL REDUCTION;  Surgeon: Dian Queen, MD;  Location: [facility];  Service: Gynecology;  Laterality: Bilateral;   TEE WITHOUT CARDIOVERSION N/A 02/08/2017   Procedure: TRANSESOPHAGEAL ECHOCARDIOGRAM (TEE);  Surgeon: Sanda Klein, MD;  Location: [place];  Service: Cardiovascular;  Laterality: N/A;   VALVE REPLACEMENT  04/2017 [facility]   Social History   Tobacco Use   Smoking status: Never   Smokeless tobacco: Never  Substance Use Topics   Alcohol use: Yes    Alcohol/week: 3.0 standard drinks    Types: 3 Glasses of wine per week    Comment: occa   Marital Status: Married   Review of Systems  Cardiovascular:  Negative for chest pain, dyspnea on exertion and leg swelling.  Gastrointestinal:  Negative for melena.   Objective  Blood pressure 128/70, pulse 69, temperature 98.2 F (36.8 C), temperature source Temporal, resp. rate 17, height 5' 3"  (1.6  m), weight 132 lb (59.9 kg), SpO2 99 %. Body mass index is 23.38 kg/m.   Vitals with BMI 06/19/2021 06/19/2021 06/07/2021  Height - 5' 3"  5' 3"   Weight - 132 lbs 132 lbs  BMI - 06.26 94.85  Systolic 462 703 -  Diastolic 70 76 -  Pulse 69 74 -    Orthostatic VS for the past 72 hrs (Last 3 readings):  Patient Position BP Location Cuff Size  06/19/21 1455 Sitting Left Arm Normal  06/19/21 1443 Sitting Left Arm Normal      Physical Exam Constitutional:      General: She is not in acute distress.     Appearance: She is well-developed.  Neck:     Thyroid: No thyromegaly.     Vascular: No carotid bruit or JVD.  Cardiovascular:     Rate and Rhythm: Normal rate and regular rhythm.     Pulses: Normal pulses and intact distal pulses.     Heart sounds: Murmur heard.  Early systolic murmur is present with a grade of 2/6 at the upper right sternal border.    No gallop.  Pulmonary:     Effort: Pulmonary effort is normal.     Breath sounds: Normal breath sounds.  Abdominal:     General: Bowel sounds are normal.     Palpations: Abdomen is soft.  Musculoskeletal:        General: No swelling. Normal range of motion.     Cervical back: Neck supple.  Skin:    Capillary Refill: Capillary refill takes less than 2 seconds.     Comments: Vitiligo present   Radiology: No results found.  Laboratory examination:   08/30/2020:  BUN 10, creatinine 0.60, EGFR >60 mL, CMP normal. Serum glucose 91 mg.  TSH normal at 2.390.  Total cholesterol 160, triglycerides 106, HDL 53, LDL 88.  Hb 14.2/HCT 42.3, platelets 248. Labs 02/12/2019: Hb 14.2/HCT 42.3, platelets 248.  BUN 15, creatinine 0.74, EGFR greater than 60 mL, CMP normal.  Serum glucose 98 mg.  TSH normal.  Total cholesterol 173, triglycerides 82, HDL 55, LDL 102.  Non-HDL cholesterol 118.   Medications   Current Outpatient Medications on File Prior to Visit  Medication Sig Dispense Refill   estradiol (ESTRACE) 0.5 MG tablet Take 0.5 mg by mouth daily.     LORazepam (ATIVAN PO) Take 0.25 mg by mouth.     Multiple Vitamins-Minerals (HM MULTIVITAMIN ADULT GUMMY PO) Take by mouth. 1 daily     progesterone (PROMETRIUM) 100 MG capsule Take 100 mg by mouth daily.     TRIVISC 25 MG/2.5ML SOSY INJECT 1 SYRINGE WEEKLY AS DIRECTED     No current facility-administered medications on file prior to visit.    CXR 03/30/2021: No acute infiltrate, pleural effusion or pneumothorax. The cardiomediastinal silhouette is normal in size and contour. Postop  change of the heart. Dual leadcardiac device.  Cardiac Studies:   AV replacement 04/24/2017: Open Aortic valve replacement with CE bioprosthesis size # 23 and PFO repair  - Dr. Vito Backers at [facility]. H/O AV replacement in 2004.   Aortic aneurysm repair and AVR 08/11/2003 with #23 Carpentier- Edwards bovine pericardial heterograft and ascending aorta graft repair by Dr Nolon Lennert in [facility]  CTA chest 10/28/2019:  1. Stable appearance of prosthetic aortic valve and ascending aortic graft without evidence of anastomotic pseudoaneurysm or dissection. 2. Hepatic steatosis. 3. Stable well-circumscribed rounded density in the medial and inferior quadrant of the right breast. This was present  dating back to even 2004 by CT and given stability is likely a cyst or fibroadenoma. Recommend correlation with prior mammography. No significant change from CTA Chest 08/05/18   Echocardiogram [facility] clinic) 03/30/2021:  - The left ventricle is normal in size. Left ventricular systolic function is  normal. EF = 59  5% (2D biplane)  - The right ventricle is normal in size. Right ventricular systolic function is normal.  - Carpentier-Edwards prosthetic aortic valve (size #23). There is trace (trace - 1+) aortic valve regurgitation. The peak gradient is 19 mmHg, the mean gradient is  9 mmHg and the dimensionless valve index is 0.48. Prior AV gradients: 27/29mHg.  - Exam was compared with the prior CC echocardiographic exam performed on  04/29/2017. AV gradients appear slightly lower on today's exam.   [facility] 03/30/20[facility]: RIGHT SIDE  Common carotid artery: Patent.  Internal carotid artery: 0-19% stenosis.  Vertebral artery: Patent and antegrade flow noted.  LEFT SIDE  Common carotid artery: Patent.  Internal carotid artery: 0-19% stenosis.  Vertebral artery: Patent and antegrade flow noted.    Medtronic Dual chamber pacemaker 04/30/2017:    Scheduled Remote pacemaker  check 03/22/2021:  A paced 38%, V paced 99%.  Longevity >5 years.  No mode switches, no high ventricular rates.  Normal pacemaker function.  EKG:  EKG 06/19/2021: AV paced rhythm at rate of 64 bpm.  No further analysis.  No significant change from 12/14/2020.   Assessment   1. Neurocardiogenic syncope   2. Thoracic aortic aneurysm without rupture ([place]   3. H/O aortic valve replacement with tissue graft   4. H/O thoracic aortic aneurysm repair   5. Pacemaker Medtronic 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 04/30/2017   6. Heart block [place] complete ([place]     Orders Placed This Encounter  Procedures   CT ANGIO CHEST AORTA W/CM & OR WO/CM    Standing Status:   Future    Standing Expiration Date:   06/19/2022    Order Specific Question:   If indicated for the ordered procedure, I authorize the administration of contrast media per Radiology protocol    Answer:   Yes    Order Specific Question:   Preferred imaging location?    Answer:   [id number]    Order Specific Question:   Is patient pregnant?    Answer:   No   EKG 12-Lead   No orders of the defined types were placed in this encounter.  There are no discontinued medications.   Recommendations:   Chevon A KLeidner is a 55y.o. f h/o bicuspid aortic valve and ascending aortic aneurysm, she also has [facility], the vascular structures and RV very close to the retrosternal space.  Underwent redo bioprosthetic valve replacement due to degenerative aortic stenosis along with closure of PFO on 04/24/2018. Postoperatively, developed complete heart block leading to placement of a MRI compatible Medtronic dual-chamber pacemaker on 02/28/2017.   She presented to me in January 2022 while she was shopping suddenly felt dizzy and nearly fainted and associated with mild diaphoresis.  She felt extremely weak and fatigued following this for whole day.  I felt the episode  was vasovagal, and evaluated her then.  She has had couple more episodes since then and last episode was 2 months ago.  [facility], I reviewed  her office visit note and also echocardiogram and carotid duplex with the patient.  Extensive discussion held regarding everyone of the findings including LVEF, reassured that ejection fraction of 595% that was noted recently was no significantly different from prior LVEF of 65 to 68%.  It could be related to the fact that in 2018 she was not RV paced.  I also reassured her that her aortic valve gradient is remained stable in fact has improved with regard to prosthetic aortic valve.  She needs repeat CT angiogram of the chest to follow-up on aortic aneurysm that we decided to do it every other year to reduce radiation risk.  Otherwise pacemaker is functioning normally, she is overdue for pacemaker transmission which I encouraged her to continue to transmit as she is pacemaker dependent on a regular basis. EKG reveals ventricularly paced rhythm.  50 minute of OV encounter today.    Adrian Prows, MD, [facility] 06/19/2021, 10:47 PM Office: [phone]

## 2021-06-21 ENCOUNTER — Ambulatory Visit (INDEPENDENT_AMBULATORY_CARE_PROVIDER_SITE_OTHER): Payer: BLUE CROSS/BLUE SHIELD | Admitting: Family Medicine

## 2021-06-21 ENCOUNTER — Ambulatory Visit: Payer: Self-pay

## 2021-06-21 ENCOUNTER — Other Ambulatory Visit: Payer: Self-pay

## 2021-06-21 ENCOUNTER — Encounter: Payer: Self-pay | Admitting: Family Medicine

## 2021-06-21 DIAGNOSIS — M1711 Unilateral primary osteoarthritis, right knee: Secondary | ICD-10-CM

## 2021-06-21 NOTE — Progress Notes (Signed)
Subjective: She is here for Trivisc #3 for right knee osteoarthritis.  She has had a lot more discomfort after the second injection.  Objective: No warmth erythema.  Trace effusion.  Procedure: Ultrasound-guided right knee injection: After sterile prep with Betadine, injected 3 cc 1% lidocaine without epinephrine and Trivisc into the superior joint recess, which did not have hyperemia on power Doppler imaging.  She tolerated this well.  Follow-up as needed.

## 2021-07-13 ENCOUNTER — Ambulatory Visit
Admission: RE | Admit: 2021-07-13 | Discharge: 2021-07-13 | Disposition: A | Discharge status: Home / Self Care | Payer: BLUE CROSS/BLUE SHIELD | Source: Ambulatory Visit | Attending: Cardiology | Admitting: Cardiology

## 2021-07-13 ENCOUNTER — Other Ambulatory Visit: Payer: Self-pay

## 2021-07-13 DIAGNOSIS — Z8679 Personal history of other diseases of the circulatory system: Secondary | ICD-10-CM

## 2021-07-13 DIAGNOSIS — Z954 Presence of other heart-valve replacement: Secondary | ICD-10-CM

## 2021-07-13 DIAGNOSIS — I712 Thoracic aortic aneurysm, without rupture, unspecified: Secondary | ICD-10-CM

## 2021-07-13 MED ORDER — IOPAMIDOL (ISOVUE-370) INJECTION 76%
75.0000 mL | Freq: Once | INTRAVENOUS | Status: AC | PRN
  Administered 2021-07-13: 75 mL via INTRAVENOUS

## 2021-07-13 NOTE — Progress Notes (Signed)
CT angiogram of the chest 07/13/2021: 1. Surgical changes of prior aortic valve and ascending thoracic aortic replacement without evidence of complication. 2. Left subclavian approach cardiac rhythm maintenance device in place.

## 2021-10-12 LAB — COLOGUARD
COLOGUARD: NEGATIVE
COLOGUARD: NEGATIVE
COLOGUARD: NEGATIVE
COLOGUARD: NEGATIVE

## 2021-10-17 NOTE — Telephone Encounter (Signed)
From pt

## 2021-12-12 ENCOUNTER — Encounter: Payer: Self-pay | Admitting: Cardiology

## 2021-12-12 NOTE — Telephone Encounter (Signed)
From pt

## 2021-12-20 ENCOUNTER — Encounter: Payer: Self-pay | Admitting: Cardiology

## 2021-12-20 NOTE — Telephone Encounter (Signed)
From pt

## 2022-02-13 ENCOUNTER — Other Ambulatory Visit (HOSPITAL_BASED_OUTPATIENT_CLINIC_OR_DEPARTMENT_OTHER): Payer: Self-pay | Admitting: Orthopaedic Surgery

## 2022-02-13 DIAGNOSIS — M25562 Pain in left knee: Secondary | ICD-10-CM

## 2022-02-13 DIAGNOSIS — M25561 Pain in right knee: Secondary | ICD-10-CM

## 2022-02-14 ENCOUNTER — Ambulatory Visit (HOSPITAL_BASED_OUTPATIENT_CLINIC_OR_DEPARTMENT_OTHER)
Admission: RE | Admit: 2022-02-14 | Discharge: 2022-02-14 | Disposition: A | Discharge status: Home / Self Care | Payer: BC Managed Care – PPO | Source: Ambulatory Visit | Attending: Orthopaedic Surgery | Admitting: Orthopaedic Surgery

## 2022-02-14 ENCOUNTER — Ambulatory Visit (INDEPENDENT_AMBULATORY_CARE_PROVIDER_SITE_OTHER): Payer: BC Managed Care – PPO | Admitting: Orthopaedic Surgery

## 2022-02-14 ENCOUNTER — Other Ambulatory Visit: Payer: Self-pay

## 2022-02-14 DIAGNOSIS — M25561 Pain in right knee: Secondary | ICD-10-CM | POA: Insufficient documentation

## 2022-02-14 DIAGNOSIS — M170 Bilateral primary osteoarthritis of knee: Secondary | ICD-10-CM

## 2022-02-14 DIAGNOSIS — M25562 Pain in left knee: Secondary | ICD-10-CM | POA: Diagnosis present

## 2022-02-14 NOTE — Progress Notes (Signed)
? ?                            ? ? ?Chief Complaint: Bilateral knee pain ?  ? ? ?History of Present Illness:  ? ? ?Kayla Archer is a 56 y.o. female presents with right worse than left knee pain now for many years.  She has previously been seen by Dr. Prince Rome who has performed a number of treatments on the right knee.  She does have a history of right knee arthroscopy with a debridement performed by Dr. Valentina Gu.  At that time MRI showed evidence of possible meniscal root injury.  She has tried physical therapy extensively on both knees and has a home program.  This only gives her somewhat limited relief.  She has previously had PRP on the right knee surveillance prior with limited relief.  She had Synvisc in the right knee which gave her approximately 6 months of relief this was done in August 2022.  She has previously had injections into the right knee but never into the left knee.  These seem to give her the longer lasting relief.  She takes Advil as needed.  She does have difficulty and pain with stairs.  She feels like both knees are giving out and buckling.  She reports always being active and exercising regularly.  She plays tennis frequently although this again causes knee pain on both sides. ? ? ? ?Surgical History:   ?Right knee arthroscopy in 2015 with Dr. Valentina Gu for right knee debridement ? ?PMH/PSH/Family History/Social History/Meds/Allergies:   ? ?Past Medical History:  ?Diagnosis Date  ? Cartilage tear   ? rt knee  ? CHB (complete heart block) ([facility]) 04/24/2017  ? Overview:  History: Post-op  Assessment: CHB with escape rate 50-60's. S/p PPM placement 05.22.2018.   PMW intact. On low-dose BB.   Plan: Continue low dose BB at discharge. Follow EP recommendations at discharge.  ? Encounter for care of pacemaker 09/27/2019  ? Heart murmur   ? Medical history non-contributory   ? see valve rep-  ? Pacemaker Medtronic 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 04/30/2017 06/24/2017  ? PONV (postoperative nausea and  vomiting)   ? plus hypotension  ? ?Past Surgical History:  ?Procedure Laterality Date  ? CARDIAC VALVE REPLACEMENT  04  ? thoracic aortic aneurysm plus valve  ? INSERT / REPLACE / REMOVE PACEMAKER    ? at [facility] 04/2017 for complete heart block  ? KNEE ARTHROSCOPY Right 12/28/2013  ? Procedure: ARTHROSCOPY KNEE;  Surgeon: Dannielle Huh, MD;  [facility];  Service: Orthopedics;  Laterality: Right;  ? LABIOPLASTY Bilateral 10/04/2016  ? Procedure: LABIAL REDUCTION;  Surgeon: Marcelle Overlie, MD;  Location: [facility];  Service: Gynecology;  Laterality: Bilateral;  ? TEE WITHOUT CARDIOVERSION N/A 02/08/2017  ? Procedure: TRANSESOPHAGEAL ECHOCARDIOGRAM (TEE);  Surgeon: Thurmon Fair, MD;  [facility];  Service: Cardiovascular;  Laterality: N/A;  ? VALVE REPLACEMENT  04/2017 [facility]  ? ?Social History  ? ?Socioeconomic History  ? Marital status: Married  ?  Spouse name: Not on file  ? Number of children: 2  ? Years of education: Not on file  ? Highest education level: Not on file  ?Occupational History  ? Not on file  ?Tobacco Use  ? Smoking status: Never  ? Smokeless tobacco: Never  ?Vaping Use  ? Vaping Use: Never used  ?Substance and Sexual Activity  ? Alcohol use:  Yes  ?  Alcohol/week: 3.0 standard drinks  ?  Types: 3 Glasses of wine per week  ?  Comment: occa  ? Drug use: No  ? Sexual activity: Not on file  ?Other Topics Concern  ? Not on file  ?Social History Narrative  ? Not on file  ? ?Social Determinants of Health  ? ?Financial Resource Strain: Not on file  ?Food Insecurity: Not on file  ?Transportation Needs: Not on file  ?Physical Activity: Not on file  ?Stress: Not on file  ?Social Connections: Not on file  ? ?Family History  ?Problem Relation Age of Onset  ? Lung cancer Mother 99  ? Hypertension Mother   ? Diabetes type II Mother   ? Other Mother   ?     NONHODGKINS LYMPHOMA  ? Stroke Father   ? Heart attack Father   ? Other Sister   ?     PRIMARY PULMONARY HTN  ? Hypertension Sister    ? Diabetes type II Sister   ? Other Sister   ? Lung cancer Sister   ? Hypertension Sister   ? Diabetes type II Sister   ? Healthy Brother   ? ?Allergies  ?Allergen Reactions  ? Acetic Acid Other (See Comments) and Rash  ?  Excessive coughing per patient   ? ?Current Outpatient Medications  ?Medication Sig Dispense Refill  ? estradiol (ESTRACE) 0.5 MG tablet Take 0.5 mg by mouth daily.    ? LORazepam (ATIVAN PO) Take 0.25 mg by mouth.    ? Multiple Vitamins-Minerals (HM MULTIVITAMIN ADULT GUMMY PO) Take by mouth. 1 daily    ? progesterone (PROMETRIUM) 100 MG capsule Take 100 mg by mouth daily.    ? TRIVISC 25 MG/2.5ML SOSY INJECT 1 SYRINGE WEEKLY AS DIRECTED    ? ?No current facility-administered medications for this visit.  ? ?No results found. ? ?Review of Systems:   ?A ROS was performed including pertinent positives and negatives as documented in the HPI. ? ?Physical Exam :   ?Constitutional: NAD and appears stated age ?Neurological: Alert and oriented ?Psych: Appropriate affect and cooperative ?There were no vitals taken for this visit.  ? ?Comprehensive Musculoskeletal Exam:   ? ?  ?Musculoskeletal Exam  ?Gait Normal  ?Alignment Normal  ? Right Left  ?Inspection Normal Normal  ?Palpation    ?Tenderness Medial joint Posterior knee  ?Crepitus None None  ?Effusion None None  ?Range of Motion    ?Extension 0 0  ?Flexion 135 135  ?Strength    ?Extension 5/5 5/5  ?Flexion 5/5 5/5  ?Ligament Exam     ?Generalized Laxity No No  ?Lachman Negative Negative   ?Pivot Shift Negative Negative  ?Anterior Drawer Negative Negative  ?Valgus at 0 Negative Negative  ?Valgus at 20 Negative Negative  ?Varus at 0 0 0  ?Varus at 20   0 0  ?Posterior Drawer at 90 0 0  ?Vascular/Lymphatic Exam    ?Edema None None  ?Venous Stasis Changes No No  ?Distal Circulation Normal Normal  ?Neurologic    ?Light Touch Sensation Intact Intact  ?Special Tests:   ? ? ? ?Imaging:   ?Xray (4 views right knee, 4 views left knee): ?There is mild  tricompartmental osteoarthritis in both knees with predominantly medial tibiofemoral space involvement ? ?I personally reviewed and interpreted the radiographs. ? ? ?Assessment:   ?56 year old female with very mild osteoarthritis of both knees.  At this point I do not believe that she would necessarily be  a candidate for knee arthroplasty given the mild nature of her disease.  That being said I am more curious about the underlying status of her cartilage.  More specifically with regard to the right knee I do believe she might have a distant history of a meniscal root tear that is to her aggravated an earlier onset osteoarthritis.  I do believe that she would possibly be a candidate for knee preservation surgery although this is difficult to completely advise her on without an updated MRI to assess the status of her underlying cartilage and meniscal root.  In the meantime she will continue to do physical therapy and would like to consider additional steroid injections in early May prior [place].  I do believe that this is reasonable.  Should she not get longer lasting relief I do believe that an MRI could be of use to assess her underlying cartilage status and to further discuss her knee preservation options ? ?Plan :   ? ?-Plan for continued physical therapy strengthening of bilateral knees ?-Return to clinic in 1 month for bilateral knee steroid injections ? ? ? ? ?I personally saw and evaluated the patient, and participated in the management and treatment plan. ? ?Huel CoteSteven Felipe Paluch, MD ?Attending Physician, Orthopedic Surgery ? ?This document was dictated using Conservation officer, historic buildingsDragon voice recognition software. A reasonable attempt at proof reading has been made to minimize errors. ?

## 2022-03-25 ENCOUNTER — Encounter: Payer: Self-pay | Admitting: Cardiology

## 2022-04-16 ENCOUNTER — Telehealth: Payer: Self-pay | Admitting: Orthopaedic Surgery

## 2022-04-16 ENCOUNTER — Encounter (HOSPITAL_BASED_OUTPATIENT_CLINIC_OR_DEPARTMENT_OTHER): Payer: Self-pay | Admitting: Orthopaedic Surgery

## 2022-04-16 NOTE — Telephone Encounter (Signed)
Responded to patient via MyChart saying Dr. Steward Drone does use ultrasound to guide his injections. ?

## 2022-04-16 NOTE — Telephone Encounter (Signed)
Pt called requesting a call back concerning about upcoming appt for injection. Pt wanted to know if injection is guided by ultrasound. Please call pt at [phone]. ?

## 2022-04-20 ENCOUNTER — Ambulatory Visit (HOSPITAL_BASED_OUTPATIENT_CLINIC_OR_DEPARTMENT_OTHER): Payer: BC Managed Care – PPO | Admitting: Orthopaedic Surgery

## 2022-05-02 ENCOUNTER — Ambulatory Visit (INDEPENDENT_AMBULATORY_CARE_PROVIDER_SITE_OTHER): Payer: BC Managed Care – PPO | Admitting: Orthopaedic Surgery

## 2022-05-02 DIAGNOSIS — M170 Bilateral primary osteoarthritis of knee: Secondary | ICD-10-CM | POA: Diagnosis not present

## 2022-05-02 DIAGNOSIS — M1712 Unilateral primary osteoarthritis, left knee: Secondary | ICD-10-CM

## 2022-05-02 DIAGNOSIS — M1711 Unilateral primary osteoarthritis, right knee: Secondary | ICD-10-CM

## 2022-05-02 MED ORDER — LIDOCAINE HCL 1 % IJ SOLN
4.0000 mL | INTRAMUSCULAR | Status: AC | PRN
  Administered 2022-05-02: 4 mL

## 2022-05-02 MED ORDER — TRIAMCINOLONE ACETONIDE 40 MG/ML IJ SUSP
80.0000 mg | INTRAMUSCULAR | Status: AC | PRN
  Administered 2022-05-02: 80 mg via INTRA_ARTICULAR

## 2022-05-02 NOTE — Progress Notes (Signed)
Chief Complaint: Bilateral knee pain     History of Present Illness:   05/02/2022: Presents today for bilateral knee injections prior to her trip to [place].    Surgical History:   Right knee arthroscopy in 2015 with Dr. Lorre Nick for right knee debridement  PMH/PSH/Family History/Social History/Meds/Allergies:    Past Medical History:  Diagnosis Date  . Cartilage tear    rt knee  . CHB (complete heart block) ([place]) 04/24/2017   Overview:  History: Post-op  Assessment: CHB with escape rate 50-60's. S/p PPM placement 05.22.2018.   PMW intact. On low-dose BB.   Plan: Continue low dose BB at discharge. Follow EP recommendations at discharge.  . Encounter for care of pacemaker 09/27/2019  . Heart murmur   . Medical history non-contributory    see valve rep-  . Pacemaker Medtronic 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 04/30/2017 06/24/2017  . PONV (postoperative nausea and vomiting)    plus hypotension   Past Surgical History:  Procedure Laterality Date  . 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 INSERT / REPLACE / REMOVE PACEMAKER     at [facility] 04/2017 for complete heart block  . KNEE ARTHROSCOPY Right 12/28/2013   Procedure: ARTHROSCOPY KNEE;  Surgeon: Vickey Huger, MD;  Location: [place];  Service: Orthopedics;  Laterality: Right;  . LABIOPLASTY Bilateral 10/04/2016   Procedure: LABIAL REDUCTION;  Surgeon: Dian Queen, MD;  Location: [facility];  Service: Gynecology;  Laterality: Bilateral;  . TEE WITHOUT CARDIOVERSION N/A 02/08/2017   Procedure: TRANSESOPHAGEAL ECHOCARDIOGRAM (TEE);  Surgeon: Sanda Klein, MD;  Location: [place];  Service: Cardiovascular;  Laterality: N/A;  . VALVE REPLACEMENT  04/2017 [facility]   Social History   Socioeconomic History  . Marital status: Married    Spouse name: Not on file  . Number of children: 2  . Years of education: Not on file  . Highest education level: Not  on file  Occupational History  . Not on file  Tobacco Use  . Smoking status: Never  . Smokeless tobacco: Never  Vaping Use  . Vaping Use: Never used  Substance and Sexual Activity  . Alcohol use: Yes    Alcohol/week: 3.0 standard drinks    Types: 3 Glasses of wine per week    Comment: occa  . Drug use: No  . Sexual activity: Not on file  Other Topics Concern  . Not on file  Social History Narrative  . Not on file   Social Determinants of Health   Financial Resource Strain: Not on file  Food Insecurity: Not on file  Transportation Needs: Not on file  Physical Activity: Not on file  Stress: Not on file  Social Connections: Not on file   Family History  Problem Relation Age of Onset  . Lung cancer Mother 68  . Hypertension Mother   . Diabetes type II Mother   . Other Mother        NONHODGKINS LYMPHOMA  . Stroke Father   . Heart attack Father   . Other Sister        PRIMARY PULMONARY HTN  . Hypertension Sister   . Diabetes type II Sister   . Other Sister   . Lung cancer Sister   . Hypertension Sister   .  Diabetes type II Sister   . Healthy Brother    Allergies  Allergen Reactions  . Acetic Acid Other (See Comments) and Rash    Excessive coughing per patient    Current Outpatient Medications  Medication Sig Dispense Refill  . estradiol (ESTRACE) 0.5 MG tablet Take 0.5 mg by mouth daily.    Marland Kitchen LORazepam (ATIVAN PO) Take 0.25 mg by mouth.    . Multiple Vitamins-Minerals (HM MULTIVITAMIN ADULT GUMMY PO) Take by mouth. 1 daily    . progesterone (PROMETRIUM) 100 MG capsule Take 100 mg by mouth daily.    . TRIVISC 25 MG/2.5ML SOSY INJECT 1 SYRINGE WEEKLY AS DIRECTED     No current facility-administered medications for this visit.   No results found.  Review of Systems:   A ROS was performed including pertinent positives and negatives as documented in the HPI.  Physical Exam :   Constitutional: NAD and appears stated age Neurological: Alert and  oriented Psych: Appropriate affect and cooperative There were no vitals taken for this visit.   Comprehensive Musculoskeletal Exam:      Musculoskeletal Exam  Gait Normal  Alignment Normal   Right Left  Inspection Normal Normal  Palpation    Tenderness Medial joint Posterior knee  Crepitus positive positive  Effusion None None  Range of Motion    Extension 0 0  Flexion 135 135  Strength    Extension 5/5 5/5  Flexion 5/5 5/5  Ligament Exam     Generalized Laxity No No  Lachman Negative Negative   Pivot Shift Negative Negative  Anterior Drawer Negative Negative  Valgus at 0 Negative Negative  Valgus at 20 Negative Negative  Varus at 0 0 0  Varus at 20   0 0  Posterior Drawer at 90 0 0  Vascular/Lymphatic Exam    Edema None None  Venous Stasis Changes No No  Distal Circulation Normal Normal  Neurologic    Light Touch Sensation Intact Intact  Special Tests:      Imaging:   Xray (4 views right knee, 4 views left knee): There is mild tricompartmental osteoarthritis in both knees with predominantly medial tibiofemoral space involvement  I personally reviewed and interpreted the radiographs.   Assessment:   56 year old female with very mild osteoarthritis of both knees.  At this time we will plan for bilateral knee injections prior to her trip.  She will see [place] back should she not get complete relief from this or she would like to discuss further injections  Plan :    -Plan for bilateral ultrasound-guided knee injections today after verbal consent obtained    Procedure Note  Patient: Tanita Cann             Date of Birth: [birth year]           MRN: [id number]             Visit Date: 05/02/2022  Procedures: Visit Diagnoses: No diagnosis found.  Large Joint Inj: R knee on 05/02/2022 4:39 PM Indications: pain Details: 22 G 1.5 in needle, ultrasound-guided anterior approach  Arthrogram: No  Medications: 4 mL lidocaine 1 %; 80 mg triamcinolone acetonide 40  MG/ML Outcome: tolerated well, no immediate complications Procedure, treatment alternatives, risks and benefits explained, specific risks discussed. Consent was given by the patient. Immediately prior to procedure a time out was called to verify the correct patient, procedure, equipment, support staff and site/side marked as required. Patient was prepped and draped in the usual sterile fashion.  Large Joint Inj: L knee on 05/02/2022 4:40 PM Indications: pain Details: 22 G 1.5 in needle, ultrasound-guided anterior approach  Arthrogram: No  Medications: 4 mL lidocaine 1 %; 80 mg triamcinolone acetonide 40 MG/ML Outcome: tolerated well, no immediate complications Procedure, treatment alternatives, risks and benefits explained, specific risks discussed. Consent was given by the patient. Immediately prior to procedure a time out was called to verify the correct patient, procedure, equipment, support staff and site/side marked as required. Patient was prepped and draped in the usual sterile fashion.         I personally saw and evaluated the patient, and participated in the management and treatment plan.  Vanetta Mulders, MD Attending Physician, Orthopedic Surgery  This document was dictated using Dragon voice recognition software. A reasonable attempt at proof reading has been made to minimize errors.

## 2022-06-04 ENCOUNTER — Encounter: Payer: Self-pay | Admitting: Cardiology

## 2022-06-04 DIAGNOSIS — Z8679 Personal history of other diseases of the circulatory system: Secondary | ICD-10-CM

## 2022-06-04 DIAGNOSIS — I442 Atrioventricular block, complete: Secondary | ICD-10-CM

## 2022-06-04 DIAGNOSIS — Z954 Presence of other heart-valve replacement: Secondary | ICD-10-CM

## 2022-06-08 NOTE — Telephone Encounter (Signed)
I will need to contact pacemaker rep for pacemaker appointment, can you tell me what day is her echocardiogram scheduled? Please advise.

## 2022-06-13 ENCOUNTER — Ambulatory Visit: Payer: BC Managed Care – PPO

## 2022-06-13 DIAGNOSIS — I442 Atrioventricular block, complete: Secondary | ICD-10-CM

## 2022-06-13 DIAGNOSIS — Z9889 Other specified postprocedural states: Secondary | ICD-10-CM

## 2022-06-13 DIAGNOSIS — Z954 Presence of other heart-valve replacement: Secondary | ICD-10-CM

## 2022-06-14 NOTE — Telephone Encounter (Signed)
Medtronic rep has been contacted.

## 2022-06-20 ENCOUNTER — Encounter: Payer: Self-pay | Admitting: Cardiology

## 2022-06-20 ENCOUNTER — Ambulatory Visit: Payer: BC Managed Care – PPO | Admitting: Cardiology

## 2022-06-20 VITALS — BP 132/79 | HR 73 | Temp 98.2°F | Resp 16 | Ht 63.0 in | Wt 132.0 lb

## 2022-06-20 DIAGNOSIS — Z950 Presence of cardiac pacemaker: Secondary | ICD-10-CM

## 2022-06-20 DIAGNOSIS — I442 Atrioventricular block, complete: Secondary | ICD-10-CM

## 2022-06-20 DIAGNOSIS — Z954 Presence of other heart-valve replacement: Secondary | ICD-10-CM

## 2022-06-20 DIAGNOSIS — Z8679 Personal history of other diseases of the circulatory system: Secondary | ICD-10-CM

## 2022-06-20 DIAGNOSIS — Z45018 Encounter for adjustment and management of other part of cardiac pacemaker: Secondary | ICD-10-CM

## 2022-06-20 NOTE — Progress Notes (Addendum)
Primary Physician/Referring:  Kayla Archer  Patient ID: Kayla Archer, female    DOB: 12/21/1965, 56 y.o.   MRN: [id number]  Chief Complaint  Patient presents with   Aortic valve replacement   Thoracic Aortic Aneurysm   Follow-up    1 year    HPI: Kayla Archer  is a 56 y.o. female patient with h/o bicuspid aortic valve and ascending aortic aneurysm, she also has had ascending aortic aneurysm repair which was very complex done in [facility] in 2004, the vascular structures and RV very close to the retrosternal space.  Underwent redo bioprosthetic valve replacement due to degenerative aortic stenosis along with closure of PFO on 04/24/2017. Postoperatively, developed complete heart block leading to placement of a MRI compatible Medtronic dual-chamber pacemaker on 04/30/2017.   This is annual visit, presently doing well and essentially asymptomatic.  She has not had any recurrence of syncope that occurred 18 months ago which was felt to be vasovagal episode.  Past Medical History:  Diagnosis Date   Cartilage tear    rt knee   CHB (complete heart block) ([place]) 04/24/2017   Overview:  History: Post-op  Assessment: CHB with escape rate 50-60's. S/p PPM placement 05.22.2018.   PMW intact. On low-dose BB.   Plan: Continue low dose BB at discharge. Follow EP recommendations at discharge.   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 04/30/2017 06/24/2017   PONV (postoperative nausea and vomiting)    plus hypotension    Past Surgical History:  Procedure Laterality Date   CARDIAC VALVE REPLACEMENT  04   thoracic aortic aneurysm plus valve   INSERT / REPLACE / REMOVE PACEMAKER     at [facility] 04/2017 for complete heart block   KNEE ARTHROSCOPY Right 12/28/2013   Procedure: ARTHROSCOPY KNEE;  Surgeon: Vickey Huger, MD;  Location: [place];   Service: Orthopedics;  Laterality: Right;   LABIOPLASTY Bilateral 10/04/2016   Procedure: LABIAL REDUCTION;  Surgeon: Dian Queen, MD;  Location: [facility];  Service: Gynecology;  Laterality: Bilateral;   TEE WITHOUT CARDIOVERSION N/A 02/08/2017   Procedure: TRANSESOPHAGEAL ECHOCARDIOGRAM (TEE);  Surgeon: Sanda Klein, MD;  Location: [place];  Service: Cardiovascular;  Laterality: N/A;   VALVE REPLACEMENT  04/2017 [facility]   Social History   Tobacco Use   Smoking status: Never   Smokeless tobacco: Never  Substance Use Topics   Alcohol use: Yes    Alcohol/week: 3.0 standard drinks of alcohol    Types: 3 Glasses of wine per week    Comment: occa   Marital Status: Married   Review of Systems  Cardiovascular:  Negative for chest pain, dyspnea on exertion and leg swelling.  Gastrointestinal:  Negative for melena.    Objective  Blood pressure 132/79, pulse 73, temperature 98.2 F (36.8 C), temperature source Temporal, resp. rate 16, height 5' 3"  (1.6 m), weight 132 lb (59.9 kg), SpO2 99 %. Body mass index is 23.38 kg/m.      06/20/2022    1:44 PM 06/19/2021    2:55 PM 06/19/2021    2:43 PM  Vitals with BMI  Height 5' 3"   5' 3"   Weight 132 lbs  132 lbs  BMI 96.28  36.62  Systolic 947 654 650  Diastolic 79 70 76  Pulse 73 69 74    No data found.  Physical Exam Constitutional:      General: She is not in acute distress.    Appearance: She is well-developed.  Neck:     Thyroid: No thyromegaly.     Vascular: No carotid bruit or JVD.  Cardiovascular:     Rate and Rhythm: Normal rate and regular rhythm.     Pulses: Normal pulses and intact distal pulses.     Heart sounds: Murmur heard.     Early systolic murmur is present with a grade of 2/6 at the upper right sternal border.     Midsystolic murmur of grade 2/6 is also present at the apex.     No gallop.  Pulmonary:     Effort: Pulmonary effort is normal.     Breath sounds: Normal breath sounds.  Abdominal:      General: Bowel sounds are normal.     Palpations: Abdomen is soft.  Musculoskeletal:        General: No swelling. Normal range of motion.     Cervical back: Neck supple.  Skin:    Capillary Refill: Capillary refill takes less than 2 seconds.     Comments: Vitiligo present    Radiology: No results found.  Laboratory examination:   Labs 03/15/2022:  Total cholesterol 158, triglycerides 80, HDL 52, LDL 91.  Non-HDL cholesterol 106.  BUN 14, creatinine 0.66, EGFR >60 mill.  LFTs normal.  Vitamin D32.8.  CBC 10/04/2021:  Hb 14.3/HCT 42.2, platelets 211.   Medications    Current Outpatient Medications:    estradiol (ESTRACE) 0.5 MG tablet, Take 0.5 mg by mouth daily., Disp: , Rfl:    LORazepam (ATIVAN PO), Take 0.25 mg by mouth., Disp: , Rfl:    Multiple Vitamins-Minerals (HM MULTIVITAMIN ADULT GUMMY PO), Take by mouth. 1 daily, Disp: , Rfl:    progesterone (PROMETRIUM) 100 MG capsule, Take 100 mg by mouth daily., Disp: , Rfl:     CXR 03/30/2021: No acute infiltrate, pleural effusion or pneumothorax. The cardiomediastinal silhouette is normal in size and contour. Postop change of the heart. Dual leadcardiac device.  CT angiogram of the chest 07/13/2021: 1. Surgical changes of prior aortic valve and ascending thoracic aortic replacement without evidence of complication. 2. Left subclavian approach cardiac rhythm maintenance device in place.  Cardiac Studies:   AV replacement 04/24/2017: Open Aortic valve replacement with CE bioprosthesis size # 23 and PFO repair  - Dr. Vito Backers at [facility]. H/O AV replacement in 2004.   Aortic aneurysm repair and AVR 08/11/2003 with #23 Carpentier- Edwards bovine pericardial heterograft and ascending aorta graft repair by Dr Nolon Lennert in [facility]  Echocardiogram [facility] clinic) 03/30/2021:  - The left ventricle is normal in size. Left ventricular systolic function is  normal. EF = 59  5% (2D biplane)  - The right ventricle  is normal in size. Right ventricular systolic function is normal.  - Carpentier-Edwards prosthetic aortic valve (size #23). There is trace (trace - 1+) aortic valve regurgitation. The peak gradient is 19 mmHg, the mean gradient is  9 mmHg and the dimensionless valve index is 0.48. Prior AV gradients: 27/57mHg.  - Exam was compared with the prior CC echocardiographic exam performed on  04/29/2017. AV gradients appear slightly lower on today's exam.   [facility] [place] 03/30/2021: RIGHT SIDE  Common carotid artery: Patent.  Internal carotid artery: 0-19% stenosis.  Vertebral artery: Patent and antegrade flow noted.  LEFT SIDE  Common carotid artery: Patent.  Internal carotid artery: 0-19% stenosis.  Vertebral artery: Patent and antegrade flow noted.    PCV ECHOCARDIOGRAM COMPLETE 06/13/2022  1. Normal LV systolic function with EF 66%. Left ventricle cavity is normal in size. Normal left ventricular wall thickness. Normal global wall motion. Normal diastolic filling pattern, normal LAP. Calculated EF 66%. 2. Right ventricle cavity is normal in size. Right ventricle pacemaker lead wires visualized. Normal right ventricular function. 3. Bioprosthetic trileaflet aortic valve. No evidence of aortic stenosis. Trace aortic regurgitation. Normal aortic valve leaflet mobility. Peak velocity  1.40ms, Peak Pressure Gradient  14 mmHg, Mean Gradient 7.857mg, AVA 1.79 cm, Dimensionless Index 0.54 4. Borderline prolapse of the mitral valve leaflets. Mild (Grade I) mitral regurgitation. 5. Structurally normal tricuspid valve.  Mild tricuspid regurgitation. No evidence of pulmonary hypertension. 6. Structurally normal pulmonic valve.  Mild pulmonic regurgitation. 7. The aortic root is normal in size. Aortic root measured 2.8 cm and acing aorta at 3.5 cm. 8.. No significant change since prior study 07/04/2017.    Medtronic Dual chamber pacemaker 04/30/2017:    Remote dual-chamber pacemaker  transmission 03/20/2022: AP 36%, VP 100%.  Longevity 7 years and 8 months.  Lead impedance and thresholds within normal limits.  There were no mode switches.  No high ventricular rate episodes.  Normal pacemaker function.  Scheduled  In office pacemaker check 06/20/22  Single (S)/Dual (D)/BV: D. Presenting ASVP. Pacemaker dependant:  Yes. Underlying CHB. No ventricular escape. AP 39%, VP 100%.  . AMS Episodes 0.  AT/AF burden 0% . HVR 0  Longevity 7 Years. Magnet rate: >85%. Lead measurements: Stable. Histogram: Low (L)/normal (N)/high (H)  Normal. Patient activity Excellent.   Observations: Normal pacemaker function. Changes: None.   EKG:  EKG 06/19/2021: AV paced rhythm at rate of 64 bpm.  No further analysis.  No significant change from 12/14/2020.   Assessment   1. Encounter for care of pacemaker   2. Pacemaker Medtronic 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 04/30/2017   3. Heart block AV complete ([place]  4. H/O aortic valve replacement with tissue graft   5. H/O thoracic aortic aneurysm repair     Orders Placed This Encounter  Procedures   CT ANGIO CHEST AORTA W/CM & OR WO/CM    Draw labs Wt: 132 / not diabetic/ no kidney problems or kidney diseases/ yes both kidneys No spinal stimulator/no body injector/no glucose or heart monitor/ no port/ pt has a pacemaker No special needs/ Allergic to contrast no Ins: BCBS  S/w patient  epic  order/LM pt aware of $75 no show fee    Standing Status:   Future    Standing Expiration Date:   06/21/2023    Order Specific Question:   If indicated for the ordered procedure, I authorize the administration of contrast media per Radiology protocol    Answer:   Yes    Order Specific Question:   Preferred imaging location?    Answer:   [facility]    Order Specific Question:   Is patient pregnant?    Answer:   No   No orders of the defined types were placed in this encounter.   Medications Discontinued During This Encounter  Medication  Reason   TRNorth [place]5 MG/2.5ML SOSY      Recommendations:   Anushri A KnMinnifieldis a [age].o. female patient with h/o bicuspid aortic valve and ascending aortic aneurysm, she also has had ascending aortic [facility], the  vascular structures and RV very close to the retrosternal space.  Underwent redo bioprosthetic valve replacement due to degenerative aortic stenosis along with closure of PFO on 04/24/2017. Postoperatively, developed complete heart block leading to placement of a MRI compatible Medtronic dual-chamber pacemaker on 04/30/2017.   This is annual visit, presently doing well and essentially remains asymptomatic.  Except for a very soft systolic ejection murmur in the aortic area and a midsystolic murmur in the mitral area physical examination is unremarkable.  Echocardiogram reviewed, in fact not only is that unchanged from 2022 but it is also unchanged from postrepair in 2018.  I reviewed the results of the CT scan that was performed in [place] [facility] last year.  She needs annual screening and follow-up of aortic aneurysm repair, CT scan will be ordered.  With regard to pacemaker function, normal function.  No changes in the programming.  She is pacemaker dependent.  Otherwise stable from cardiac standpoint, labs from external source reviewed, normal lipids, normal renal function.  I will see her back on an annual basis.     Adrian Prows, MD, [facility] 06/29/2022, 4:55 PM Office: [phone]

## 2022-06-27 ENCOUNTER — Encounter: Payer: Self-pay | Admitting: Cardiology

## 2022-06-29 NOTE — Telephone Encounter (Signed)
From pt

## 2022-07-24 ENCOUNTER — Ambulatory Visit
Admission: RE | Admit: 2022-07-24 | Discharge: 2022-07-24 | Disposition: A | Discharge status: Home / Self Care | Payer: BC Managed Care – PPO | Source: Ambulatory Visit | Attending: Cardiology | Admitting: Cardiology

## 2022-07-24 ENCOUNTER — Encounter: Payer: Self-pay | Admitting: Cardiology

## 2022-07-24 DIAGNOSIS — Z954 Presence of other heart-valve replacement: Secondary | ICD-10-CM

## 2022-07-24 DIAGNOSIS — Z8679 Personal history of other diseases of the circulatory system: Secondary | ICD-10-CM

## 2022-07-24 MED ORDER — IOPAMIDOL (ISOVUE-370) INJECTION 76%
75.0000 mL | Freq: Once | INTRAVENOUS | Status: AC | PRN
  Administered 2022-07-24: 75 mL via INTRAVENOUS

## 2022-07-24 NOTE — Progress Notes (Signed)
CT scan of the chest for aorta with and without contrast 07/24/2022: Comparison 07/13/2021.  Cardiovascular: Dual-lead pacemaker in situ.  Redemonstration of the prior ascending aortic aneurysm surgical repair without thoracic aneurysm or dissection or significant atherosclerotic plaque or coronary calcification.  Normal heart size.  No pericardial effusion. No significant extracardiac abnormality.

## 2022-11-26 DIAGNOSIS — H0288B Meibomian gland dysfunction left eye, upper and lower eyelids: Secondary | ICD-10-CM | POA: Diagnosis not present

## 2022-11-26 DIAGNOSIS — H0288A Meibomian gland dysfunction right eye, upper and lower eyelids: Secondary | ICD-10-CM | POA: Diagnosis not present

## 2022-11-26 DIAGNOSIS — H00021 Hordeolum internum right upper eyelid: Secondary | ICD-10-CM | POA: Diagnosis not present

## 2022-11-26 DIAGNOSIS — H0014 Chalazion left upper eyelid: Secondary | ICD-10-CM | POA: Diagnosis not present

## 2022-12-07 DIAGNOSIS — Z1329 Encounter for screening for other suspected endocrine disorder: Secondary | ICD-10-CM | POA: Diagnosis not present

## 2022-12-07 DIAGNOSIS — Z13220 Encounter for screening for lipoid disorders: Secondary | ICD-10-CM | POA: Diagnosis not present

## 2022-12-07 DIAGNOSIS — Z131 Encounter for screening for diabetes mellitus: Secondary | ICD-10-CM | POA: Diagnosis not present

## 2022-12-07 DIAGNOSIS — Z1321 Encounter for screening for nutritional disorder: Secondary | ICD-10-CM | POA: Diagnosis not present

## 2022-12-18 DIAGNOSIS — Z950 Presence of cardiac pacemaker: Secondary | ICD-10-CM | POA: Diagnosis not present

## 2022-12-18 DIAGNOSIS — I442 Atrioventricular block, complete: Secondary | ICD-10-CM | POA: Diagnosis not present

## 2022-12-19 ENCOUNTER — Encounter: Payer: Self-pay | Admitting: Cardiology

## 2022-12-19 DIAGNOSIS — J208 Acute bronchitis due to other specified organisms: Secondary | ICD-10-CM | POA: Diagnosis not present

## 2022-12-19 DIAGNOSIS — B9689 Other specified bacterial agents as the cause of diseases classified elsewhere: Secondary | ICD-10-CM | POA: Diagnosis not present

## 2022-12-19 DIAGNOSIS — J329 Chronic sinusitis, unspecified: Secondary | ICD-10-CM | POA: Diagnosis not present

## 2022-12-26 DIAGNOSIS — Z6823 Body mass index (BMI) 23.0-23.9, adult: Secondary | ICD-10-CM | POA: Diagnosis not present

## 2022-12-26 DIAGNOSIS — Z124 Encounter for screening for malignant neoplasm of cervix: Secondary | ICD-10-CM | POA: Diagnosis not present

## 2022-12-26 DIAGNOSIS — Z01419 Encounter for gynecological examination (general) (routine) without abnormal findings: Secondary | ICD-10-CM | POA: Diagnosis not present

## 2022-12-26 DIAGNOSIS — Z1231 Encounter for screening mammogram for malignant neoplasm of breast: Secondary | ICD-10-CM | POA: Diagnosis not present

## 2023-01-08 DIAGNOSIS — I442 Atrioventricular block, complete: Secondary | ICD-10-CM | POA: Diagnosis not present

## 2023-01-08 DIAGNOSIS — Q2112 Patent foramen ovale: Secondary | ICD-10-CM | POA: Diagnosis not present

## 2023-01-08 DIAGNOSIS — G8929 Other chronic pain: Secondary | ICD-10-CM | POA: Diagnosis not present

## 2023-01-08 DIAGNOSIS — J329 Chronic sinusitis, unspecified: Secondary | ICD-10-CM | POA: Diagnosis not present

## 2023-01-08 DIAGNOSIS — M545 Low back pain, unspecified: Secondary | ICD-10-CM | POA: Diagnosis not present

## 2023-02-05 ENCOUNTER — Encounter (HOSPITAL_BASED_OUTPATIENT_CLINIC_OR_DEPARTMENT_OTHER): Payer: Self-pay | Admitting: Orthopaedic Surgery

## 2023-02-07 ENCOUNTER — Encounter: Payer: Self-pay | Admitting: Cardiology

## 2023-02-08 ENCOUNTER — Ambulatory Visit (INDEPENDENT_AMBULATORY_CARE_PROVIDER_SITE_OTHER): Payer: BC Managed Care – PPO | Admitting: Orthopaedic Surgery

## 2023-02-08 DIAGNOSIS — M170 Bilateral primary osteoarthritis of knee: Secondary | ICD-10-CM | POA: Diagnosis not present

## 2023-02-08 DIAGNOSIS — M25562 Pain in left knee: Secondary | ICD-10-CM

## 2023-02-08 DIAGNOSIS — M25561 Pain in right knee: Secondary | ICD-10-CM

## 2023-02-08 MED ORDER — LIDOCAINE HCL 1 % IJ SOLN
4.0000 mL | INTRAMUSCULAR | Status: AC | PRN
  Administered 2023-02-08: 4 mL

## 2023-02-08 MED ORDER — TRIAMCINOLONE ACETONIDE 40 MG/ML IJ SUSP
80.0000 mg | INTRAMUSCULAR | Status: AC | PRN
  Administered 2023-02-08: 80 mg via INTRA_ARTICULAR

## 2023-02-08 NOTE — Progress Notes (Signed)
Chief Complaint: Bilateral knee pain     History of Present Illness:   02/08/2023: Overall got great relief from bilateral knee injections and was able to make it to [place] without any issues.  She is here today seeking additional bilateral knee injections.    Surgical History:   Right knee arthroscopy in 2015 with Dr. Lorre Nick for right knee debridement  PMH/PSH/Family History/Social History/Meds/Allergies:    Past Medical History:  Diagnosis Date   Cartilage tear    rt knee   CHB (complete heart block) ([place]) 04/24/2017   Overview:  History: Post-op  Assessment: CHB with escape rate 50-60's. S/p PPM placement 05.22.2018.   PMW intact. On low-dose BB.   Plan: Continue low dose BB at discharge. Follow EP recommendations at discharge.   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 04/30/2017 06/24/2017   PONV (postoperative nausea and vomiting)    plus hypotension   Past Surgical History:  Procedure Laterality Date   CARDIAC VALVE REPLACEMENT  04   thoracic aortic aneurysm plus valve   INSERT / REPLACE / REMOVE PACEMAKER     at [facility] 04/2017 for complete heart block   KNEE ARTHROSCOPY Right 12/28/2013   Procedure: ARTHROSCOPY KNEE;  Surgeon: Vickey Huger, MD;  Location: [facility];  Service: Orthopedics;  Laterality: Right;   LABIOPLASTY Bilateral 10/04/2016   Procedure: LABIAL REDUCTION;  Surgeon: Dian Queen, MD;  Location: [facility];  Service: Gynecology;  Laterality: Bilateral;   TEE WITHOUT CARDIOVERSION N/A 02/08/2017   Procedure: TRANSESOPHAGEAL ECHOCARDIOGRAM (TEE);  Surgeon: Sanda Klein, MD;  Location: [facility] ENDOSCOPY;  Service: Cardiovascular;  Laterality: N/A;   VALVE REPLACEMENT  04/2017 [facility]   Social History   Socioeconomic History   Marital status: Married    Spouse name: Not on file   Number  of children: 2   Years of education: Not on file   Highest education level: Not on file  Occupational History   Not on file  Tobacco Use   Smoking status: Never   Smokeless tobacco: Never  Vaping Use   Vaping Use: Never used  Substance and Sexual Activity   Alcohol use: Yes    Alcohol/week: 3.0 standard drinks of alcohol    Types: 3 Glasses of wine per week    Comment: occa   Drug use: No   Sexual activity: Not on file  Other Topics Concern   Not on file  Social History Narrative   Not on file   Social Determinants of Health   Financial Resource Strain: Not on file  Food Insecurity: Not on file  Transportation Needs: Not on file  Physical Activity: Not on file  Stress: Not on file  Social Connections: Not on file   Family History  Problem Relation Age of Onset   Lung cancer Mother 65   Hypertension Mother    Diabetes type II Mother    Other Mother        [place]oya [place] LYMPHOMA   Stroke Father    Heart attack Father    Other Sister        PRIMARY PULMONARY HTN   Hypertension Sister    Diabetes type II  Sister    Other Sister    Lung cancer Sister    Hypertension Sister    Diabetes type II Sister    Healthy Brother    Allergies  Allergen Reactions   Acetic Acid Other (See Comments) and Rash    Excessive coughing per patient    Current Outpatient Medications  Medication Sig Dispense Refill   estradiol (ESTRACE) 0.5 MG tablet Take 0.5 mg by mouth daily.     LORazepam (ATIVAN PO) Take 0.25 mg by mouth.     Multiple Vitamins-Minerals (HM MULTIVITAMIN ADULT GUMMY PO) Take by mouth. 1 daily     progesterone (PROMETRIUM) 100 MG capsule Take 100 mg by mouth daily.     No current facility-administered medications for this visit.   No results found.  Review of Systems:   A ROS was performed including pertinent positives and negatives as documented in the HPI.  Physical Exam :   Constitutional: NAD and appears stated age Neurological: Alert and oriented Psych:  Appropriate affect and cooperative There were no vitals taken for this visit.   Comprehensive Musculoskeletal Exam:      Musculoskeletal Exam  Gait Normal  Alignment Normal   Right Left  Inspection Normal Normal  Palpation    Tenderness Medial joint Posterior knee  Crepitus positive positive  Effusion None None  Range of Motion    Extension 0 0  Flexion 135 135  Strength    Extension 5/5 5/5  Flexion 5/5 5/5  Ligament Exam     Generalized Laxity No No  Lachman Negative Negative   Pivot Shift Negative Negative  Anterior Drawer Negative Negative  Valgus at 0 Negative Negative  Valgus at 20 Negative Negative  Varus at 0 0 0  Varus at 20   0 0  Posterior Drawer at 90 0 0  Vascular/Lymphatic Exam    Edema None None  Venous Stasis Changes No No  Distal Circulation Normal Normal  Neurologic    Light Touch Sensation Intact Intact  Special Tests:      Imaging:   Xray (4 views right knee, 4 views left knee): There is mild tricompartmental osteoarthritis in both knees with predominantly medial tibiofemoral space involvement  I personally reviewed and interpreted the radiographs.   Assessment:   57 year old female with very mild osteoarthritis of both knees.  At this time we will plan for bilateral knee injections prior to her trip.  She will see [place] back should she not get complete relief from this or she would like to discuss further injections  Plan :    -Plan for bilateral ultrasound-guided knee injections today after verbal consent obtained    Procedure Note  Patient: Kayla Archer             Date of Birth: 1966/02/12           MRN: [id number]             Visit Date: 02/08/2023  Procedures: Visit Diagnoses: No diagnosis found.  Large Joint Inj: R knee on 02/08/2023 3:23 PM Indications: pain Details: 22 G 1.5 in needle, ultrasound-guided anterior approach  Arthrogram: No  Medications: 4 mL lidocaine 1 %; 80 mg triamcinolone acetonide 40 MG/ML Outcome:  tolerated well, no immediate complications Procedure, treatment alternatives, risks and benefits explained, specific risks discussed. Consent was given by the patient. Immediately prior to procedure a time out was called to verify the correct patient, procedure, equipment, support staff and site/side marked as required. Patient was prepped and  draped in the usual sterile fashion.    Large Joint Inj: L knee on 02/08/2023 3:23 PM Indications: pain Details: 22 G 1.5 in needle, ultrasound-guided anterior approach  Arthrogram: No  Medications: 4 mL lidocaine 1 %; 80 mg triamcinolone acetonide 40 MG/ML Outcome: tolerated well, no immediate complications Procedure, treatment alternatives, risks and benefits explained, specific risks discussed. Consent was given by the patient. Immediately prior to procedure a time out was called to verify the correct patient, procedure, equipment, support staff and site/side marked as required. Patient was prepped and draped in the usual sterile fashion.         I personally saw and evaluated the patient, and participated in the management and treatment plan.  Vanetta Mulders, MD Attending Physician, Orthopedic Surgery  This document was dictated using Dragon voice recognition software. A reasonable attempt at proof reading has been made to minimize errors.

## 2023-02-13 DIAGNOSIS — H0279 Other degenerative disorders of eyelid and periocular area: Secondary | ICD-10-CM | POA: Diagnosis not present

## 2023-02-13 DIAGNOSIS — H0011 Chalazion right upper eyelid: Secondary | ICD-10-CM | POA: Diagnosis not present

## 2023-02-13 DIAGNOSIS — H0012 Chalazion right lower eyelid: Secondary | ICD-10-CM | POA: Diagnosis not present

## 2023-02-13 DIAGNOSIS — H0014 Chalazion left upper eyelid: Secondary | ICD-10-CM | POA: Diagnosis not present

## 2023-02-13 DIAGNOSIS — H02831 Dermatochalasis of right upper eyelid: Secondary | ICD-10-CM | POA: Diagnosis not present

## 2023-03-19 DIAGNOSIS — Z45018 Encounter for adjustment and management of other part of cardiac pacemaker: Secondary | ICD-10-CM | POA: Diagnosis not present

## 2023-03-19 DIAGNOSIS — I442 Atrioventricular block, complete: Secondary | ICD-10-CM | POA: Diagnosis not present

## 2023-04-10 ENCOUNTER — Encounter: Payer: Self-pay | Admitting: Cardiology

## 2023-06-07 ENCOUNTER — Other Ambulatory Visit (HOSPITAL_BASED_OUTPATIENT_CLINIC_OR_DEPARTMENT_OTHER): Payer: Self-pay | Admitting: Orthopaedic Surgery

## 2023-06-07 ENCOUNTER — Encounter (HOSPITAL_BASED_OUTPATIENT_CLINIC_OR_DEPARTMENT_OTHER): Payer: Self-pay | Admitting: Orthopaedic Surgery

## 2023-06-07 DIAGNOSIS — M170 Bilateral primary osteoarthritis of knee: Secondary | ICD-10-CM

## 2023-06-17 ENCOUNTER — Encounter (HOSPITAL_BASED_OUTPATIENT_CLINIC_OR_DEPARTMENT_OTHER): Payer: Self-pay | Admitting: Student

## 2023-06-17 ENCOUNTER — Ambulatory Visit (INDEPENDENT_AMBULATORY_CARE_PROVIDER_SITE_OTHER): Payer: BC Managed Care – PPO | Admitting: Student

## 2023-06-17 DIAGNOSIS — M170 Bilateral primary osteoarthritis of knee: Secondary | ICD-10-CM

## 2023-06-17 MED ORDER — MELOXICAM 15 MG PO TABS: 15.0000 mg | ORAL_TABLET | Freq: Every day | ORAL | 0 refills | Status: AC

## 2023-06-17 NOTE — Progress Notes (Signed)
Chief Complaint: Bilateral knee pain     History of Present Illness:    Kayla Archer is a 57 y.o. female presenting today with bilateral knee pain.  She does have known osteoarthritis of both knees and received bilateral cortisone injections on 02/08/2023.  She states that this gave her good relief until the last 2 weeks or so.  She has had a significant increase in symptoms over the last 11 days after playing a few games of tennis.  Denies any known injury.  She notes an increase of stiffness, burning, and locking that is worse in the right knee than the left.  Has had at least 1 episode of the left knee buckling.  Has significant difficulty going up or down stairs.  Also states that there is a bruise on the back of her right knee.  Pain at rest is a 4 out of 10 but gets worse with weightbearing making it very difficult to ambulate.  Pain has been also waking her up at night.  She has tried taking Advil, icing, as well as stretching and massage.  She is scheduled for an appointment with Dr. Madelyn Brunner on Thursday for discussion of PRP and hyaluronic acid injections.   Surgical History:   Right knee arthroscopy 2015  PMH/PSH/Family History/Social History/Meds/Allergies:    Past Medical History:  Diagnosis Date   Cartilage tear    rt knee   CHB (complete heart block) ([facility]) 04/24/2017   Overview:  History: Post-op  Assessment: CHB with escape rate 50-60's. S/p PPM placement 05.22.2018.   PMW intact. On low-dose BB.   Plan: Continue low dose BB at discharge. Follow EP recommendations at discharge.   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 04/30/2017 06/24/2017   PONV (postoperative nausea and vomiting)    plus hypotension   Past Surgical History:  Procedure Laterality Date   CARDIAC VALVE REPLACEMENT  04   thoracic aortic  aneurysm plus valve   INSERT / REPLACE / REMOVE PACEMAKER     at [facility] 04/2017 for complete heart block   KNEE ARTHROSCOPY Right 12/28/2013   Procedure: ARTHROSCOPY KNEE;  Surgeon: Dannielle Huh, MD;  Location: [facility];  Service: Orthopedics;  Laterality: Right;   LABIOPLASTY Bilateral 10/04/2016   Procedure: LABIAL REDUCTION;  Surgeon: Marcelle Overlie, MD;  Location: [facility];  Service: Gynecology;  Laterality: Bilateral;   TEE WITHOUT CARDIOVERSION N/A 02/08/2017   Procedure: TRANSESOPHAGEAL ECHOCARDIOGRAM (TEE);  Surgeon: Thurmon Fair, MD;  Location: [facility] ENDOSCOPY;  Service: Cardiovascular;  Laterality: N/A;   VALVE REPLACEMENT  04/2017 [facility]   Social History   Socioeconomic History   Marital status: Married    Spouse name: Not on file   Number of children: 2   Years of education: Not on file   Highest education level: Not on file  Occupational History   Not on file  Tobacco Use   Smoking status: Never   Smokeless tobacco: Never  Vaping Use   Vaping Use: Never used  Substance and Sexual Activity   Alcohol use: Yes    Alcohol/week: 3.0 standard drinks of alcohol    Types: 3 Glasses of wine per week  Comment: occa   Drug use: No   Sexual activity: Not on file  Other Topics Concern   Not on file  Social History Narrative   Not on file   Social Determinants of Health   Financial Resource Strain: Not on file  Food Insecurity: Not on file  Transportation Needs: Not on file  Physical Activity: Not on file  Stress: Not on file  Social Connections: Not on file   Family History  Problem Relation Age of Onset   Lung cancer Mother 80   Hypertension Mother    Diabetes type II Mother    Other Mother        [place] LYMPHOMA   Stroke Father    Heart attack Father    Other Sister        PRIMARY PULMONARY HTN   Hypertension Sister    Diabetes type II Sister    Other Sister    Lung cancer Sister    Hypertension Sister    Diabetes type II Sister     Healthy Brother    Allergies  Allergen Reactions   Acetic Acid Other (See Comments) and Rash    Excessive coughing per patient    Current Outpatient Medications  Medication Sig Dispense Refill   meloxicam (MOBIC) 15 MG tablet Take 1 tablet (15 mg total) by mouth daily for 10 days. 10 tablet 0   estradiol (ESTRACE) 0.5 MG tablet Take 0.5 mg by mouth daily.     LORazepam (ATIVAN PO) Take 0.25 mg by mouth.     Multiple Vitamins-Minerals (HM MULTIVITAMIN ADULT GUMMY PO) Take by mouth. 1 daily     progesterone (PROMETRIUM) 100 MG capsule Take 100 mg by mouth daily.     No current facility-administered medications for this visit.   No results found.  Review of Systems:   A ROS was performed including pertinent positives and negatives as documented in the HPI.  Physical Exam :   Constitutional: NAD and appears stated age Neurological: Alert and oriented Psych: Appropriate affect and cooperative There were no vitals taken for this visit.   Comprehensive Musculoskeletal Exam:      Musculoskeletal Exam  Gait Antalgic  Alignment Normal   Right Left  Inspection Small area of ecchymosis in the posterior knee just superior to the joint line. Normal  Palpation    Tenderness Medial joint line Lateral to patella  Crepitus Present Present  Effusion None None  Range of Motion    Extension 0 0  Flexion 130 130  Strength    Extension 5/5 5/5  Flexion 5/5 5/5  Ligament Exam     Generalized Laxity No No  Lachman Negative Negative   Valgus at 0 Negative - Painful Negative  Valgus at 20 Negative Negative  Varus at 0 0 0  Varus at 20   0 0  Vascular/Lymphatic Exam    Edema None None  Venous Stasis Changes No No  Distal Circulation Normal Normal  Neurologic    Light Touch Sensation Intact Intact  Special Tests:      Imaging:     Assessment:   58 y.o. female with bilateral knee osteoarthritis presenting with worsening and persistent pain mainly of the right knee.  No known  mechanism of injury, only recent increased activity levels with returning to tennis.  Discussed that aggravation of osteoarthritis can cause similar symptoms, however due to the increase of her mechanical symptoms and such an acute period, would be unable to rule out a meniscal injury.  I would like to hold off on performing cortisone injections today until she sees Dr. Shon Baton to discuss other injection options, but this could be considered at that time if any wait or authorization time is needed.  I will send in some meloxicam to help try and control her pain and recommend utilizing a supportive knee brace which patient has already.  Should pain and mechanical symptoms continue, can consider MRI to evaluate from meniscal injury.  Plan :    -Start meloxicam 15 mg -Appointment with Dr. Shon Baton on 7/11 to discuss gel/PRP injections -Consider future MRI of the right knee    I personally saw and evaluated the patient, and participated in the management and treatment plan.  Hazle Nordmann, PA-C Orthopedics  This document was dictated using Conservation officer, historic buildings. A reasonable attempt at proof reading has been made to minimize errors.

## 2023-06-18 DIAGNOSIS — I442 Atrioventricular block, complete: Secondary | ICD-10-CM | POA: Diagnosis not present

## 2023-06-20 ENCOUNTER — Ambulatory Visit: Payer: BC Managed Care – PPO | Admitting: Physician Assistant

## 2023-06-20 ENCOUNTER — Encounter: Payer: Self-pay | Admitting: Physician Assistant

## 2023-06-20 ENCOUNTER — Ambulatory Visit: Payer: BC Managed Care – PPO | Admitting: Sports Medicine

## 2023-06-20 ENCOUNTER — Ambulatory Visit: Payer: BC Managed Care – PPO | Admitting: Cardiology

## 2023-06-20 DIAGNOSIS — S83241A Other tear of medial meniscus, current injury, right knee, initial encounter: Secondary | ICD-10-CM | POA: Insufficient documentation

## 2023-06-20 DIAGNOSIS — S83242D Other tear of medial meniscus, current injury, left knee, subsequent encounter: Secondary | ICD-10-CM

## 2023-06-20 DIAGNOSIS — S83241D Other tear of medial meniscus, current injury, right knee, subsequent encounter: Secondary | ICD-10-CM | POA: Diagnosis not present

## 2023-06-20 DIAGNOSIS — M25561 Pain in right knee: Secondary | ICD-10-CM

## 2023-06-20 MED ORDER — LIDOCAINE HCL 1 % IJ SOLN
2.0000 mL | INTRAMUSCULAR | Status: AC | PRN
  Administered 2023-06-20: 2 mL

## 2023-06-20 MED ORDER — METHYLPREDNISOLONE ACETATE 40 MG/ML IJ SUSP
80.0000 mg | INTRAMUSCULAR | Status: AC | PRN
Start: 2023-06-20 — End: 2023-06-20
  Administered 2023-06-20: 80 mg via INTRA_ARTICULAR

## 2023-06-20 MED ORDER — BUPIVACAINE HCL 0.25 % IJ SOLN
2.0000 mL | INTRAMUSCULAR | Status: AC | PRN
  Administered 2023-06-20: 2 mL via INTRA_ARTICULAR

## 2023-06-20 NOTE — Addendum Note (Signed)
Addended by: Polly Cobia on: 06/20/2023 04:41 PM   Modules accepted: Orders

## 2023-06-20 NOTE — Progress Notes (Signed)
Office Visit Note   Patient: Kayla Archer           Date of Birth: 01-09-1966           MRN: [id number] Visit Date: 06/20/2023              Requested by: Teena Irani, PA-C [address] [place],  [place] [address] PCP: Teena Irani, PA-C  No chief complaint on file.     HPI: Kayla Archer is a pleasant active 57 year old woman with ongoing issues with her knees.  She did have an arthroscopy previously on her right knee.  She has been followed by Dr. Steward Archer and received injections earlier this year which helped a little bit.  She has had increasing episodes of mechanical symptoms in her right greater than left knee.  This includes locking catching and giving way.  She has taken anti-inflammatories both orally and topically without much relief.  She was playing tennis recently and both of her knees gave way and she had what she classifies as severe pain.  She has failed conservative treatments and x-rays done do not show any significant pathology other than a little bit of early arthritis.  She points today to the medial femoral condyle of her right knee is particularly painful.   Assessment & Plan: Visit Diagnoses:  1. Arthralgia of right knee   2. Acute medial meniscus tear of right knee, subsequent encounter   3. Other tear of medial meniscus, current injury, left knee, subsequent encounter   Bilateral knee pain  Plan: Active 57 year old woman has had increasing pain in both of her knees.  She is status post right knee arthroscopy on her right knee in the past.  She is having more locking catching and giving way and mechanical symptoms.  This is now been going on for several months.  She would like to go forward with MRIs of her knees I think this would be appropriate.  She was going on vacation was wondering if she could get a steroid injection into her right knee.  I did tell her this may alter the MRI a little bit but given her circumstances I think it would be  appropriate  Follow-Up Instructions: Return With Dr.Bokshan or Brooks after MRIs.   Ortho Exam  Patient is alert, oriented, no adenopathy, well-dressed, normal affect, normal respiratory effort. Bilateral knees no effusion no erythema.  She is neurovascular intact compartments are soft and nontender on the right knee in particular she is tender over the medial joint line more towards the medial femoral condyle.  She does have some grinding with range of motion.  She also has pain reproduced with valgus stressing on the right knee though not tender over the insertion of the MCL good anterior posterior stability  Imaging: No results found. No images are attached to the encounter.  Labs: Lab Results  Component Value Date   ESRSEDRATE 8 10/01/2015   CRP 0.5 10/01/2015   REPTSTATUS 12/24/2013 FINAL 12/23/2013   CULT  12/23/2013    Multiple bacterial morphotypes present, none predominant. Suggest appropriate recollection if clinically indicated. Performed at [employer] Results  Component Value Date   ALBUMIN 3.9 10/01/2015   ALBUMIN 4.1 12/23/2013    No results found for: "MG" No results found for: "VD25OH"  No results found for: "PREALBUMIN"    Latest Ref Rng & Units 02/01/2017    4:39 PM 09/07/2016    4:47  PM 10/01/2015    9:56 PM  CBC EXTENDED  WBC 3.4 - 10.8 x10E3/uL 9.5  8.5  8.6   RBC 3.77 - 5.28 x10E6/uL 4.24  4.22  4.15   Hemoglobin 11.1 - 15.9 g/dL 40.9  81.1  91.4   HCT 34.0 - 46.6 % 40.2  41.0  39.4   Platelets 150 - 379 x10E3/uL 250  231  229   NEUT# 1.7 - 7.7 K/uL   5.9   Lymph# 0.7 - 4.0 K/uL   1.9      There is no height or weight on file to calculate BMI.  Orders:  No orders of the defined types were placed in this encounter.  No orders of the defined types were placed in this encounter.    Procedures: Large Joint Inj on 06/20/2023 4:34 PM Indications: pain and diagnostic evaluation Details: 25 G 1.5 in needle, anteromedial  approach  Arthrogram: No  Medications: 80 mg methylPREDNISolone acetate 40 MG/ML; 2 mL lidocaine 1 %; 2 mL bupivacaine 0.25 % Outcome: tolerated well, no immediate complications Procedure, treatment alternatives, risks and benefits explained, specific risks discussed. Consent was given by the patient.    Clinical Data: No additional findings.  ROS:  All other systems negative, except as noted in the HPI. Review of Systems  Objective: Vital Signs: There were no vitals taken for this visit.  Specialty Comments:  No specialty comments available.  PMFS History: Patient Active Problem List   Diagnosis Date Noted  . Acute medial meniscus tear of right knee 06/20/2023  . Other tear of medial meniscus, current injury, left knee, subsequent encounter 06/20/2023  . Heart disease 05/09/2021  . Encounter for care of pacemaker 09/27/2019  . Piriformis syndrome of left side 06/16/2019  . H/O thoracic aortic aneurysm repair 02/14/2019  . Other cytomegaloviral diseases (HCC) 02/14/2019  . Vitamin D deficiency 02/14/2019  . Degenerative arthritis of right knee 05/14/2018  . Nontraumatic neck pain 10/25/2017  . Nonallopathic lesion of cervical region 10/25/2017  . Nonallopathic lesion of rib cage 10/25/2017  . Hx of Lyme disease 07/15/2017  . Pacemaker [employer]tronic 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 04/30/2017 06/24/2017  . Atrial flutter (HCC) 05/02/2017  . Transition of care performed with sharing of clinical summary 04/26/2017  . CHB (complete heart block) ([facility]) 04/24/2017  . PFO (patent foramen ovale) 03/13/2017  . Prosthetic aortic valve stenosis   . Nonallopathic lesion of lumbosacral region 11/20/2016  . Nonallopathic lesion of sacral region 11/20/2016  . Nonallopathic lesion of thoracic region 11/20/2016  . Left lumbar radiculopathy 11/13/2016  . Left knee injury 03/29/2016  . S/P arthroscopy of knee 11/02/2014  . Mid sternal chest pain 03/31/2014  . Aortic regurgitation  03/31/2014  . S/P aortic valve replacement with bioprosthetic valve 03/31/2014  . Knee joint pain 10/20/2013  . Vitiligo 08/09/2003   Past Medical History:  Diagnosis Date  . Cartilage tear    rt knee  . CHB (complete heart block) ([facility]) 04/24/2017   Overview:  History: Post-op  Assessment: CHB with escape rate 50-60's. S/p PPM placement 05.22.2018.   PMW intact. On low-dose BB.   Plan: Continue low dose BB at discharge. Follow EP recommendations at discharge.  . Encounter for care of pacemaker 09/27/2019  . Heart murmur   . Medical history non-contributory    see valve rep-  . Pacemaker Med[employer]onic 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 04/30/2017 06/24/2017  . PONV (postoperative nausea and vomiting)    plus  hypotension    Family History  Problem Relation Age of Onset  . Lung cancer Mother 21  . Hypertension Mother   . Diabetes type II Mother   . Other Mother        [place] LYMPHOMA  . Stroke Father   . Heart attack Father   . Other Sister        PRIMARY PULMONARY HTN  . Hypertension Sister   . Diabetes type II Sister   . Other Sister   . Lung cancer Sister   . Hypertension Sister   . Diabetes type II Sister   . Healthy Brother     Past Surgical History:  Procedure Laterality Date  . 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 INSERT / REPLACE / REMOVE PACEMAKER     at [facility] 04/2017 for complete heart block  . KNEE ARTHROSCOPY Right 12/28/2013   Procedure: ARTHROSCOPY KNEE;  Surgeon: Dannielle Huh, MD;  Location: [facility];  Service: Orthopedics;  Laterality: Right;  . LABIOPLASTY Bilateral 10/04/2016   Procedure: LABIAL REDUCTION;  Surgeon: Marcelle Overlie, MD;  Location: [facility];  Service: Gynecology;  Laterality: Bilateral;  . TEE WITHOUT CARDIOVERSION N/A 02/08/2017   Procedure: TRANSESOPHAGEAL ECHOCARDIOGRAM (TEE);  Surgeon: Thurmon Fair, MD;  Location: [facility] ENDOSCOPY;  Service: Cardiovascular;  Laterality: N/A;  . VALVE REPLACEMENT   04/2017 [facility]   Social History   Occupational History  . Not on file  Tobacco Use  . Smoking status: Never  . Smokeless tobacco: Never  Vaping Use  . Vaping status: Never Used  Substance and Sexual Activity  . Alcohol use: Yes    Alcohol/week: 3.0 standard drinks of alcohol    Types: 3 Glasses of wine per week    Comment: occa  . Drug use: No  . Sexual activity: Not on file

## 2023-06-21 ENCOUNTER — Encounter: Payer: Self-pay | Admitting: Cardiology

## 2023-06-21 ENCOUNTER — Ambulatory Visit: Payer: BC Managed Care – PPO | Admitting: Cardiology

## 2023-06-21 ENCOUNTER — Encounter: Payer: Self-pay | Admitting: Sports Medicine

## 2023-06-21 VITALS — BP 135/67 | HR 72 | Resp 16 | Ht 63.0 in | Wt 132.0 lb

## 2023-06-21 DIAGNOSIS — Z954 Presence of other heart-valve replacement: Secondary | ICD-10-CM

## 2023-06-21 DIAGNOSIS — Z8679 Personal history of other diseases of the circulatory system: Secondary | ICD-10-CM | POA: Diagnosis not present

## 2023-06-21 DIAGNOSIS — Z45018 Encounter for adjustment and management of other part of cardiac pacemaker: Secondary | ICD-10-CM | POA: Diagnosis not present

## 2023-06-21 DIAGNOSIS — Z950 Presence of cardiac pacemaker: Secondary | ICD-10-CM

## 2023-06-21 DIAGNOSIS — I442 Atrioventricular block, complete: Secondary | ICD-10-CM | POA: Diagnosis not present

## 2023-06-21 DIAGNOSIS — Z9889 Other specified postprocedural states: Secondary | ICD-10-CM | POA: Diagnosis not present

## 2023-06-21 NOTE — Progress Notes (Unsigned)
Primary Physician/Referring:  Lucila Maine  Patient ID: Kayla Archer, female    DOB: [birth year], 57 y.o.   MRN: [id number]  Chief Complaint  Patient presents with   Heart failure management   Pacemaker Check   Follow-up    1 year    HPI: Kayla Archer  is a 57 y.o. female patient with h/o bicuspid aortic valve and ascending aortic aneurysm, she also has had ascending aortic aneurysm repair which was very complex done in [facility] in 2004, the vascular structures and RV very close to the retrosternal space.  Underwent redo bioprosthetic valve replacement due to degenerative aortic stenosis along with closure of PFO on 04/24/2017. Postoperatively, developed complete heart block leading to placement of a MRI compatible Medtronic dual-chamber pacemaker on 04/30/2017.   This is annual visit, presently doing well and essentially asymptomatic.  She has not had any recurrence of syncope that occurred 18 months ago which was felt to be vasovagal episode.  Past Medical History:  Diagnosis Date   Cartilage tear    rt knee   CHB (complete heart block) (HCC) 04/24/2017   Overview:  History: Post-op  Assessment: CHB with escape rate 50-60's. S/p PPM placement 05.22.2018.   PMW intact. On low-dose BB.   Plan: Continue low dose BB at discharge. Follow EP recommendations at discharge.   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 04/30/2017 06/24/2017   PONV (postoperative nausea and vomiting)    plus hypotension    Past Surgical History:  Procedure Laterality Date   CARDIAC VALVE REPLACEMENT  04   thoracic aortic aneurysm plus valve   INSERT / REPLACE / REMOVE PACEMAKER     at [facility] 04/2017 for complete heart block   KNEE ARTHROSCOPY Right 12/28/2013   Procedure: ARTHROSCOPY KNEE;  Surgeon: Dannielle Huh, MD;  Location: [facility] OR;  Service:  Orthopedics;  Laterality: Right;   LABIOPLASTY Bilateral 10/04/2016   Procedure: LABIAL REDUCTION;  Surgeon: Marcelle Overlie, MD;  Location: [facility] ORS;  Service: Gynecology;  Laterality: Bilateral;   TEE WITHOUT CARDIOVERSION N/A 02/08/2017   Procedure: TRANSESOPHAGEAL ECHOCARDIOGRAM (TEE);  Surgeon: Thurmon Fair, MD;  Location: [facility] ENDOSCOPY;  Service: Cardiovascular;  Laterality: N/A;   VALVE REPLACEMENT  04/2017 [facility]   Social History   Tobacco Use   Smoking status: Never   Smokeless tobacco: Never  Substance Use Topics   Alcohol use: Yes    Alcohol/week: 3.0 standard drinks of alcohol    Types: 3 Glasses of wine per week    Comment: occasionally   Marital Status: Married   Review of Systems  Cardiovascular:  Negative for chest pain, dyspnea on exertion and leg swelling.  Gastrointestinal:  Negative for melena.    Objective  Blood pressure 135/67, pulse 72, resp. rate 16, height 5\' 3"  (1.6 m), weight 132 lb (59.9 kg), SpO2 97%. Body mass index is 23.38 kg/m.      06/21/2023   11:35 AM 06/20/2022    1:44 PM 06/19/2021    2:55 PM  Vitals with BMI  Height 5\' 3"  5\' 3"    Weight 132 lbs 132 lbs   BMI 23.39 23.39   Systolic 135 132 657  Diastolic 67 79 70  Pulse 72 73 69    Orthostatic VS for the past 72 hrs (Last 3 readings):  Patient Position BP  Location Cuff Size  06/21/23 1135 Sitting Left Arm Normal      Physical Exam Constitutional:      General: She is not in acute distress.    Appearance: She is well-developed.  Neck:     Thyroid: No thyromegaly.     Vascular: No carotid bruit or JVD.  Cardiovascular:     Rate and Rhythm: Normal rate and regular rhythm.     Pulses: Normal pulses and intact distal pulses.     Heart sounds: Murmur heard.     Early systolic murmur is present with a grade of 2/6 at the upper right sternal border.     Midsystolic murmur of grade 2/6 is also present at the apex.     No gallop.  Pulmonary:     Effort: Pulmonary effort is  normal.     Breath sounds: Normal breath sounds.  Abdominal:     General: Bowel sounds are normal.     Palpations: Abdomen is soft.  Musculoskeletal:        General: No swelling. Normal range of motion.     Cervical back: Neck supple.  Skin:    Capillary Refill: Capillary refill takes less than 2 seconds.     Comments: Vitiligo present    Radiology: No results found.  Laboratory examination:   Labs 12/07/2021:  HbA1c 5.6%.  TSH normal at 1.750.  Vitamin D32.9.  Total cholesterol 168, triglycerides 124, HDL 42, LDL 104.  Non-HDL cholesterol 126.  Hb 13.3/HCT 41.0, platelets 296.  Serum glucose 103 mg, BUN 14, creatinine 0.66, EGFR 102 mL, potassium 4.6.  Labs 03/15/2022:  Total cholesterol 158, triglycerides 80, HDL 52, LDL 91.  Non-HDL cholesterol 106.  BUN 14, creatinine 0.66, EGFR >60 mill.  LFTs normal.  Vitamin D32.8.  CBC 10/04/2021:  Hb 14.3/HCT 42.2, platelets 211.  RADIOLOGY:   CXR 03/30/2021: No acute infiltrate, pleural effusion or pneumothorax. The cardiomediastinal silhouette is normal in size and contour. Postop change of the heart. Dual leadcardiac device.  CT scan of the chest for aorta with and without contrast 07/24/2022: Comparison 07/13/2021.   Cardiovascular: Dual-lead pacemaker in situ.  Redemonstration of the prior ascending aortic aneurysm surgical repair without thoracic aneurysm or dissection or significant atherosclerotic plaque or coronary calcification.  Normal heart size.  No pericardial effusion. No significant extracardiac abnormality.  Cardiac Studies:   AV replacement 04/24/2017: Open Aortic valve replacement with CE bioprosthesis size # 23 and PFO repair  - Dr. Jannet Askew at [facility]. H/O AV replacement in 2004.   Aortic aneurysm repair and AVR 08/11/2003 with #23 Carpentier- Edwards bovine pericardial heterograft and ascending aorta graft repair by Dr Velia Meyer in [facility]  Echocardiogram [facility] clinic) 03/30/2021:  -  The left ventricle is normal in size. Left ventricular systolic function is  normal. EF = 59  5% (2D biplane)  - The right ventricle is normal in size. Right ventricular systolic function is normal.  - Carpentier-Edwards prosthetic aortic valve (size #23). There is trace (trace - 1+) aortic valve regurgitation. The peak gradient is 19 mmHg, the mean gradient is  9 mmHg and the dimensionless valve index is 0.48. Prior AV gradients: 27/60mmHg.  - Exam was compared with the prior CC echocardiographic exam performed on  04/29/2017. AV gradients appear slightly lower on today's exam.   Carotid US [facility] clinic) 03/30/2021: RIGHT SIDE  Common carotid artery: Patent.  Internal carotid artery: 0-19% stenosis.  Vertebral artery: Patent and antegrade flow noted.  LEFT SIDE  Common carotid artery: Patent.  Internal carotid artery: 0-19% stenosis.  Vertebral artery: Patent and antegrade flow noted.    PCV ECHOCARDIOGRAM COMPLETE 06/13/2022  1. Normal LV systolic function with EF 66%. Left ventricle cavity is normal in size. Normal left ventricular wall thickness. Normal global wall motion. Normal diastolic filling pattern, normal LAP. Calculated EF 66%. 2. Right ventricle cavity is normal in size. Right ventricle pacemaker lead wires visualized. Normal right ventricular function. 3. Bioprosthetic trileaflet aortic valve. No evidence of aortic stenosis. Trace aortic regurgitation. Normal aortic valve leaflet mobility. Peak velocity  1.68m/s, Peak Pressure Gradient  14 mmHg, Mean Gradient 7.80mmHg, AVA 1.79 cm, Dimensionless Index 0.54 4. Borderline prolapse of the mitral valve leaflets. Mild (Grade I) mitral regurgitation. 5. Structurally normal tricuspid valve.  Mild tricuspid regurgitation. No evidence of pulmonary hypertension. 6. Structurally normal pulmonic valve.  Mild pulmonic regurgitation. 7. The aortic root is normal in size. Aortic root measured 2.8 cm and acing aorta at 3.5 cm. 8.. No  significant change since prior study 07/04/2017.    Medtronic Dual chamber pacemaker 04/30/2017:    Scheduled  In office pacemaker check 06/21/2023:  Single (S)/Dual (D)/BV: D. Presenting ASVP. Pacemaker dependant:  Yes. Underlying CHB. No ventricular escape. AP 37%, VP 100%.  . AMS Episodes 0.  AT/AF burden 0% . HVR 1, Brief NSVT Longevity 6.7 Years. Magnet rate: >85%. Lead measurements: Stable. Histogram: Low (L)/normal (N)/high (H)  Normal. Patient activity Excellent.    Observations: Normal pacemaker function. Changes: None.   Remote dual-chamber pacemaker transmission 06/18/2023 Longevity 6 years and 6 months.  AP 37%, VP 100%.  There were no high rate episodes.  Lead impedance and thresholds are normal.  Normal dual-chamber pacemaker function.  EKG:  *** EKG 06/19/2021: AV paced rhythm at rate of 64 bpm.  No further analysis.  No significant change from 12/14/2020.   Allergies & Medications   Allergies  Allergen Reactions   Acetic Acid Other (See Comments) and Rash    Excessive coughing per patient     Current Outpatient Medications:    estradiol (ESTRACE) 0.5 MG tablet, Take 0.5 mg by mouth daily., Disp: , Rfl:    LORazepam (ATIVAN PO), Take 0.25 mg by mouth., Disp: , Rfl:    Multiple Vitamins-Minerals (HM MULTIVITAMIN ADULT GUMMY PO), Take by mouth. 1 daily, Disp: , Rfl:    progesterone (PROMETRIUM) 100 MG capsule, Take 100 mg by mouth daily., Disp: , Rfl:    meloxicam (MOBIC) 15 MG tablet, Take 1 tablet (15 mg total) by mouth daily for 10 days. (Patient not taking: Reported on 06/21/2023), Disp: 10 tablet, Rfl: 0   Assessment   1. Encounter for care of pacemaker   2. Heart block AV complete (HCC)   3. Pacemaker Medtronic 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 04/30/2017   4. H/O aortic valve replacement Carpentier-Edwards prosthetic aortic valve (size #23) 04/24/2017   5. H/O thoracic aortic aneurysm repair     Orders Placed This Encounter  Procedures   CT ANGIO CHEST  AORTA W/CM & OR WO/CM    Standing Status:   Future    Standing Expiration Date:   06/20/2024    Order Specific Question:   If indicated for the ordered procedure, I authorize the administration of contrast media per Radiology protocol    Answer:   Yes    Order Specific Question:   Preferred imaging location?    Answer:   [facility]    Order Specific Question:  Is patient pregnant?    Answer:   No   No orders of the defined types were placed in this encounter.   There are no discontinued medications.   Recommendations:   Kayla Archer  is a 57 y.o. female patient with h/o bicuspid aortic valve and ascending aortic aneurysm, she also has had ascending aortic aneurysm repair which was very complex done in [facility] in 2004, the vascular structures and RV very close to the retrosternal space.  Underwent redo bioprosthetic valve replacement due to degenerative aortic stenosis along with closure of PFO on 04/24/2017. Postoperatively, developed complete heart block leading to placement of a MRI compatible Medtronic dual-chamber pacemaker on 04/30/2017.   This is annual visit, presently doing well and essentially remains asymptomatic.  Except for a very soft systolic ejection murmur in the aortic area and a midsystolic murmur in the mitral area physical examination is unremarkable.  Echocardiogram reviewed, in fact not only is that unchanged from 2022 but it is also unchanged from postrepair in 2018.  I reviewed the results of the CT scan that was performed in [place] [facility] last year.  She needs annual screening and follow-up of aortic aneurysm repair, CT scan will be ordered.  With regard to pacemaker function, normal function.  No changes in the programming.  She is pacemaker dependent.  Otherwise stable from cardiac standpoint, labs from external source reviewed, normal lipids, normal renal function.  I will see her back on an annual basis.     Yates Decamp, MD,  [facility] 06/21/2023, 12:14 PM Office: [phone]

## 2023-06-24 ENCOUNTER — Encounter: Payer: Self-pay | Admitting: Sports Medicine

## 2023-07-02 ENCOUNTER — Encounter: Payer: Self-pay | Admitting: Sports Medicine

## 2023-07-02 ENCOUNTER — Ambulatory Visit: Payer: BC Managed Care – PPO | Admitting: Sports Medicine

## 2023-07-02 ENCOUNTER — Telehealth: Payer: Self-pay

## 2023-07-02 DIAGNOSIS — M170 Bilateral primary osteoarthritis of knee: Secondary | ICD-10-CM | POA: Diagnosis not present

## 2023-07-02 DIAGNOSIS — Z87828 Personal history of other (healed) physical injury and trauma: Secondary | ICD-10-CM

## 2023-07-02 DIAGNOSIS — M25562 Pain in left knee: Secondary | ICD-10-CM

## 2023-07-02 DIAGNOSIS — M25561 Pain in right knee: Secondary | ICD-10-CM | POA: Diagnosis not present

## 2023-07-02 DIAGNOSIS — S83206A Unspecified tear of unspecified meniscus, current injury, right knee, initial encounter: Secondary | ICD-10-CM

## 2023-07-02 DIAGNOSIS — G8929 Other chronic pain: Secondary | ICD-10-CM

## 2023-07-02 NOTE — Telephone Encounter (Signed)
VOB submitted for TriVisc, left knee.

## 2023-07-02 NOTE — Progress Notes (Addendum)
Kayla Archer - 57 y.o. female MRN [id number]  Date of birth: 1966/09/27  Office Visit Note: Visit Date: 07/02/2023 PCP: Kayla Irani, PA-C Referred by: Kayla Irani, PA-C  Subjective: Chief Complaint  Patient presents with   Left Knee - Pain   Right Knee - Pain   HPI: Kayla Archer is a very pleasant 57 y.o. female who presents today for chronic bilateral knee pain, R > L.  History of chronic bilateral knee pain with recent exacerbation over the last 1-2 months.  Back in 2015 had a meniscal tear status post arthroscopy and meniscectomy with Dr. Valentina Archer in 2015. At that time was told may have been incomplete (?root tear) - discussed future repair/repeat surgery, but not performed.  In the years past she has underwent corticosteroid injections, viscosupplementation, PRP injection therapy.  Previously saw Dr. Steward Archer for this, had corticosteroids into both knees on 05/02/2022 which gave her excellent relief for a period of time, did have repeat corticosteroids here in 2024 which did not provide her as much relief or for as long.  She is very active, but took some time away from playing tennis until recently she played tennis and had quite searing pain between games being 10 and her pain was quite significant following this.  Describes this as a fiery/pain and burning sensation worse over the medial knee of the right compared to the left although does have pain in both knees.  Does have stiffness in the knees as well.  Had side effects with meloxicam so discontinued this.  Is taking over-the-counter anti-inflammatories with some relief.  Hoping to get back into physical activity and avoiding surgery if possible.  The right knee specifically does have more episodes of clicking, buckling and giving way.  Denies any significant swelling of bilateral knees.  -Previously had TriVisc injections which were much better tolerated than Orthovisc, prefers this if possible.  Previously had about 7-8  months of relief from this.  - Previously saw Dr. Steward Archer: "She does have a history of right knee arthroscopy with a debridement performed by Dr. Valentina Archer. At that time MRI showed evidence of possible meniscal root injury. She has tried physical therapy extensively on both knees and has a home program. This only gives her somewhat limited relief. She has previously had PRP on the right knee surveillance prior with limited relief. She had Synvisc in the right knee which gave her approximately 6 months of relief this was done in August 2022."  Past medical history includes history of bicuspid aortic valve and ascending aortic aneurysm, had this repaired at [facility] in 2004. Underwent redo bioprosthetic valve replacement due to degenerative aortic stenosis along with closure of PFO on 04/24/2017. Postoperatively, developed complete heart block leading to placement of a MRI compatible Medtronic dual-chamber pacemaker placed on 04/30/2017.  Also with autoimmune conditions, history of Lyme disease, vitiligo.  Pertinent ROS were reviewed with the patient and found to be negative unless otherwise specified above in HPI.   Assessment & Plan: Visit Diagnoses:  1. Bilateral chronic knee pain   2. Bilateral primary osteoarthritis of knee   3. Positive McMurray test of right knee, initial encounter   4. History of meniscal tear    Plan: Discussed with Kayla Archer likely pathology of her bilateral knee pain.  Right knee has a prior meniscectomy with likely root tear that may not have been completely repaired/fixed.  She does have good joint space on the lateral and patellofemoral side but has a significant amount  of loss over the medial tibiofemoral joint space, and symptoms that are quite suspicious for meniscal tearing or high-grade cartilage loss over the medial tibiofemoral joint.  Before we proceed with injection therapy or other treatments, I first think an MRI of the knee to evaluate the degree of cartilage  loss and/or meniscal pathology is pertinent before moving forward with treatment, she is agreeable and understanding.  MRI of the right knee ordered today.  She does have an MRI compatible pacemaker.  In terms of the left knee, she does have mild osteoarthritic change but certainly not advanced.  Her exam today does not suggest  meniscal pathology.  We will get her set up for viscosupplementation first for the left knee, prefers Tri-Visc if possible.  She is interested in PRP injection therapy, we will consider this at the end of the viscosupplementation for the left knee as well.  Depending on the results of the right knee, could consider viscosupplementation and PRP injection therapy as well but will need to evaluate MRI finding first.  She may continue discontinuation of meloxicam given the side effects, okay with over-the-counter anti-inflammatories in the short-term.  Would need to stop anti-inflammatories prior to PRP injection therapy.  This patient is diagnosed with osteoarthritis of the knee(s).    Radiographs show evidence of joint space narrowing, osteophytes, subchondral sclerosis and/or subchondral cysts.  This patient has knee pain which interferes with functional and activities of daily living.    This patient has experienced inadequate response, adverse effects and/or intolerance with conservative treatments such as acetaminophen, NSAIDS, topical creams, physical therapy or regular exercise, knee bracing and/or weight loss.   This patient has experienced inadequate response or has a contraindication to intra articular steroid injections for at least 3 months.   This patient is not scheduled to have a total knee replacement within 6 months of starting treatment with viscosupplementation.   Follow-up: Return for return for follow-up 5 days after knee MRI; or for visco inj once approved .   Meds & Orders: No orders of the defined types were placed in this encounter.   Orders Placed  This Encounter  Procedures   MR Knee Right w/o contrast     Procedures: No procedures performed      Clinical History: No specialty comments available.  She reports that she has never smoked. She has never used smokeless tobacco. No results for input(s): "HGBA1C", "LABURIC" in the last 8760 hours.  Objective:   Vital Signs: There were no vitals taken for this visit.  Physical Exam  Gen: Well-appearing, in no acute distress; non-toxic CV:  Well-perfused. Warm.  Resp: Breathing unlabored on room air; no wheezing. Psych: Fluid speech in conversation; appropriate affect; normal thought process Neuro: Sensation intact throughout. No gross coordination deficits.   Ortho Exam - Bilateral knees: Positive TTP over the medial joint line and medial femoral condyle of the right knee.  Positive McMurray's testing and pain with valgus stress without give way.  No TTP over the Pez anserine region.  Preserved range of motion bilateral knees from 0-135 degrees.  No TTP over the joint lines of the left knee.  Mild patellar crepitus bilaterally.  Imaging:  Narrative & Impression  CLINICAL DATA:  Right knee pain. Previous arthroscopy with partial medial meniscectomy.   EXAM: MRI OF THE RIGHT KNEE WITHOUT CONTRAST   TECHNIQUE: Multiplanar, multisequence MR imaging of the knee was performed. No intravenous contrast was administered.   COMPARISON:  MRI dated 11/08/2014   FINDINGS: MENISCI  Medial meniscus: There is slight irregularity of the meniscus at the posteromedial corner consistent with prior surgery. Small adjacent parameniscal cyst is new. The diminutive midbody is slightly extruded chronically.   Lateral meniscus:  Normal.   LIGAMENTS   Cruciates:  Normal.   Collaterals: Intact. New fluid deep to the anterior aspect of MCL consistent with medial bursitis.   CARTILAGE   Patellofemoral: Interval partial healing of chondral abnormalities noted on the prior study. Minimal  residual subcortical edema and cartilage edema at the apex.   Medial: Interval partial healing with decreased subcortical edema at the focal area of partial to full-thickness cartilage loss. This area appears smaller.   Lateral: New tiny cartilage fissure in the central portion of the femoral condyle.   Joint:  No residual knee effusion.   Popliteal Fossa: Small complex ganglion cyst adjacent to the posterior aspect of the posterior cruciate ligament. Small parameniscal ganglion cyst at the posteromedial corner.   Extensor Mechanism:  Normal.   Bones: New tiny marginal osteophytes in the medial and lateral compartments.   Other: None   IMPRESSION: 1. Interval development of mild medial bursitis. 2. Interval partial healing of cartilage defects in the patellofemoral and medial compartments. 3. New tiny focal fissure in the cartilage of the lateral femoral condyle. 4. Chronic irregularity of the posterior horn of the medial meniscus at the posteromedial corner with a new small parameniscal cyst at that site.     Electronically Signed   By: Francene Boyers M.D.   On: 10/14/2017 15:39    Narrative & Impression  CLINICAL DATA:  Bilateral knee pain.   EXAM: RIGHT KNEE - COMPLETE 4+ VIEW   COMPARISON:  MR knee 10/14/2017.   FINDINGS: No fracture or malalignment. Mild patellofemoral and moderate medial joint space degenerative change. No sizeable effusion.   IMPRESSION: Mild to moderate degenerative changes.     Electronically Signed   By: Jasmine Pang M.D.   On: 02/14/2022 22:13    Narrative & Impression  CLINICAL DATA:  Left knee pain, no known injury, initial encounter   EXAM: LEFT KNEE - COMPLETE 4+ VIEW   COMPARISON:  MR knee 11/08/2014.   FINDINGS: Tricompartmental degenerative changes are noted worst in the medial joint space. No acute fracture or dislocation is noted. No joint effusion is seen.   IMPRESSION: No change without acute  abnormality.     Electronically Signed   By: Alcide Clever M.D.   On: 02/14/2022 22:11    Past Medical/Family/Surgical/Social History: Medications & Allergies reviewed per EMR, new medications updated. Patient Active Problem List   Diagnosis Date Noted   Acute medial meniscus tear of right knee 06/20/2023   Other tear of medial meniscus, current injury, left knee, subsequent encounter 06/20/2023   Heart disease 05/09/2021   Encounter for care of pacemaker 09/27/2019   Piriformis syndrome of left side 06/16/2019   H/O thoracic aortic aneurysm repair 02/14/2019   Other cytomegaloviral diseases (HCC) 02/14/2019   Vitamin D deficiency 02/14/2019   Degenerative arthritis of right knee 05/14/2018   Nontraumatic neck pain 10/25/2017   Nonallopathic lesion of cervical region 10/25/2017   Nonallopathic lesion of rib cage 10/25/2017   Hx of Lyme disease 07/15/2017   Pacemaker Medtronic 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 04/30/2017 06/24/2017   Atrial flutter (HCC) 05/02/2017   Transition of care performed with sharing of clinical summary 04/26/2017   CHB (complete heart block) (HCC) 04/24/2017   PFO (patent foramen ovale) 03/13/2017  Prosthetic aortic valve stenosis    Nonallopathic lesion of lumbosacral region 11/20/2016   Nonallopathic lesion of sacral region 11/20/2016   Nonallopathic lesion of thoracic region 11/20/2016   Left lumbar radiculopathy 11/13/2016   Left knee injury 03/29/2016   S/P arthroscopy of knee 11/02/2014   Mid sternal chest pain 03/31/2014   Aortic regurgitation 03/31/2014   S/P aortic valve replacement with bioprosthetic valve 03/31/2014   Knee joint pain 10/20/2013   Vitiligo 08/09/2003   Past Medical History:  Diagnosis Date   Cartilage tear    rt knee   CHB (complete heart block) (HCC) 04/24/2017   Overview:  History: Post-op  Assessment: CHB with escape rate 50-60's. S/p PPM placement 05.22.2018.   PMW intact. On low-dose BB.   Plan: Continue low  dose BB at discharge. Follow EP recommendations at discharge.   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 04/30/2017 06/24/2017   PONV (postoperative nausea and vomiting)    plus hypotension   Family History  Problem Relation Age of Onset   Lung cancer Mother 65   Hypertension Mother    Diabetes type II Mother    Other Mother        [place] LYMPHOMA   Stroke Father    Heart attack Father    Other Sister        PRIMARY PULMONARY HTN   Hypertension Sister    Diabetes type II Sister    Other Sister    Lung cancer Sister    Hypertension Sister    Diabetes type II Sister    Healthy Brother    Past Surgical History:  Procedure Laterality Date   CARDIAC VALVE REPLACEMENT  04   thoracic aortic aneurysm plus valve   INSERT / REPLACE / REMOVE PACEMAKER     at [facility] 04/2017 for complete heart block   KNEE ARTHROSCOPY Right 12/28/2013   Procedure: ARTHROSCOPY KNEE;  Surgeon: Dannielle Huh, MD;  Location: [facility] OR;  Service: Orthopedics;  Laterality: Right;   LABIOPLASTY Bilateral 10/04/2016   Procedure: LABIAL REDUCTION;  Surgeon: Marcelle Overlie, MD;  Location: [facility] ORS;  Service: Gynecology;  Laterality: Bilateral;   TEE WITHOUT CARDIOVERSION N/A 02/08/2017   Procedure: TRANSESOPHAGEAL ECHOCARDIOGRAM (TEE);  Surgeon: Thurmon Fair, MD;  Location: [facility] ENDOSCOPY;  Service: Cardiovascular;  Laterality: N/A;   VALVE REPLACEMENT  04/2017 [facility]   Social History   Occupational History   Not on file  Tobacco Use   Smoking status: Never   Smokeless tobacco: Never  Vaping Use   Vaping status: Never Used  Substance and Sexual Activity   Alcohol use: Yes    Alcohol/week: 3.0 standard drinks of alcohol    Types: 3 Glasses of wine per week    Comment: occasionally   Drug use: No   Sexual activity: Not on file

## 2023-07-02 NOTE — Telephone Encounter (Signed)
-----   Message from Ellis Savage sent at 07/02/2023 10:01 AM EDT ----- Regarding: Visco Left knee Left knee visco; patient prefers TRI-VISC

## 2023-07-02 NOTE — Progress Notes (Signed)
Bilateral knee pain R>L Injection in right a few weeks ago helped minimally Complains about stiffness History of right knee meniscal repair by Dr. Sherlean Foot in 2015 Has tried PRP,Visco, Cortisone Visco--> 7-8 months of relief Discontinued meloxicam due to side effects from taking it

## 2023-07-04 ENCOUNTER — Encounter: Payer: Self-pay | Admitting: Sports Medicine

## 2023-07-12 ENCOUNTER — Other Ambulatory Visit: Payer: Self-pay

## 2023-07-12 DIAGNOSIS — M170 Bilateral primary osteoarthritis of knee: Secondary | ICD-10-CM

## 2023-07-17 ENCOUNTER — Telehealth: Payer: Self-pay | Admitting: Radiology

## 2023-07-17 ENCOUNTER — Ambulatory Visit: Payer: BC Managed Care – PPO | Admitting: Sports Medicine

## 2023-07-17 ENCOUNTER — Encounter: Payer: Self-pay | Admitting: Sports Medicine

## 2023-07-17 VITALS — BP 118/77 | HR 76 | Ht 63.0 in | Wt 132.0 lb

## 2023-07-17 DIAGNOSIS — M1712 Unilateral primary osteoarthritis, left knee: Secondary | ICD-10-CM | POA: Diagnosis not present

## 2023-07-17 DIAGNOSIS — G8929 Other chronic pain: Secondary | ICD-10-CM

## 2023-07-17 DIAGNOSIS — M170 Bilateral primary osteoarthritis of knee: Secondary | ICD-10-CM

## 2023-07-17 DIAGNOSIS — M25562 Pain in left knee: Secondary | ICD-10-CM

## 2023-07-17 MED ORDER — LIDOCAINE HCL 1 % IJ SOLN
2.0000 mL | INTRAMUSCULAR | Status: AC | PRN
Start: 2023-07-17 — End: 2023-07-17
  Administered 2023-07-17: 2 mL

## 2023-07-17 MED ORDER — SODIUM HYALURONATE (VISCOSUP) 25 MG/2.5ML IX SOSY
25.0000 mg | PREFILLED_SYRINGE | INTRA_ARTICULAR | Status: AC | PRN
Start: 2023-07-17 — End: 2023-07-17
  Administered 2023-07-17: 25 mg via INTRA_ARTICULAR

## 2023-07-17 NOTE — Patient Instructions (Addendum)
Dr. Shon Baton' Instructions and What to Expect for PRP Injections:  Platelet-rich plasma is used in musculoskeletal medicine to focus your own body's ability to heal. It has several well-done published research trials (RCT) which demonstrate both its effectiveness and safety in many musculoskeletal conditions, including osteoarthritis, tendinopathies, partial tendon tears, and damaged vertebral discs. PRP has been in clinical use since the 1990's. Many people know that platelets form a clot if there is a cut in the skin. It turns out that platelets do not only form a clot, but they also start the body's own repair process. When platelets activate to form a clot, they release alpha granules which have hundreds of chemical messengers in them that initiate and organize repair to the damaged tissue. Precisely placing PRP into the site of injury will initiate the healing process by activating on the damaged cartilage, bone or tendon. This is an inflammatory process, and inflammation is the vital first phase of healing.  PRP will initiate healing and a productive inflammation, and PRP therapy will make the body part treated sore for 4 days to two weeks. Anti-inflammatory drugs (i.e. ibuprofen, Naprosyn, Celebrex) and corticosteroids such as prednisone can blunt or stop this process, so it is important to not take any anti-inflammatory drugs for 7 days before getting PRP therapy, or for at least three weeks after PRP therapy. Corticosteroid injections can blunt inflammation for 30 days, so let us know if you have had one recently. Depending on the body part injected, you may be in a sling or on crutches for several days. Just like wringing out a wet dishcloth, if you load or tense a tendon or ligament that has just been injected with PRP, some of the PRP injected will squish out. By keeping the body part treated relaxed by using a sling (for the shoulder or arm) or crutches (for hips and legs) for a few days, the  PRP can bind in place and do its job.   What to expect and how to prepare for PRP:   Depending on the procedure, you may need to arrange for a driver to bring you home (i.e. procedure on right/driving leg). IF you are having a lower extremity procedure, we can provide crutches as needed, but these are not routinely needed.   10 days prior to the procedure: Stop taking anti-inflammatory drugs like Ibuprofen/Motrin, Advil, Naprosyn, Celebrex, or Meloxicam. Even aspirin should be stopped (but need to discuss this with Dr. Shon Baton and your cardiologist beforehand). Let Dr. Shon Baton know if you have been taking prednisone or other corticosteroids in the last 30 days as this can negatively interfere with the PRP process.   The day before the procedure: thoroughly shower and clean your skin.    The day of the procedure: Wear loose-fitting clothing like sweatpants or shorts. If you are having an upper body procedure wear a top that can button or zip up. Please show up at least 15 minutes early for your appointment, as we will need to take you back to draw your blood prior to the procedure.    Things to avoid prior to procedure: tobacco/nicotine, alcohol, fatty foods. Tobacco is a potent toxin and its use constricts small blood vessels which are needed for tissue repair. Tobacco/nicotine use will limit the effectiveness of any treatment and stopping tobacco use is one of the single greatest actions you can take to improve your health. Avoid toxins like alcohol, which inhibits and depresses the cells needed for tissue repair.  PRP will  initiate healing and a productive inflammation, and PRP therapy will make the body part treated sore for the first 3-4 days up to two weeks. Anti-inflammatory drugs (i.e. ibuprofen, Naprosyn, Celebrex) and corticosteroids such as prednisone can blunt or stop this process, so it is important to not take any anti-inflammatory drugs for 10 days before getting PRP therapy, or for at  least two weeks after PRP therapy.   Depending on the body part injected, you may be in a sling or on crutches for several days. Most of the time these are not needed, but it is important to allow time for the body part to rest after the injection. By keeping the body part treated relaxed and not loading the body part the PRP can bind in place and do its job. If you push the body part too early, this may make the PRP less effective.  What happens during the PRP procedure?  Platelet rich plasma is made by taking some of your blood and performing a two-stage centrifuge process on it to concentrate the PRP. First, your blood is drawn into a syringe with a small amount of anti-coagulant in it (this is to keep the blood from clotting during this process). The amount of blood drawn is usually about 10-30 milliliters, depending on how much PRP is needed for the treatment. Then the blood is transferred in a sterile fashion into a centrifuge tube. It is then centrifuged for the first cycle where the red blood cells are isolated and discarded. In the second centrifuge cycle, the platelet-rich fraction of the remaining plasma is concentrated and placed in a syringe. The skin at the injection site is numbed with a small amount of topical cooling spray. Dr. Shon Baton will then precisely inject the PRP into the injury site using ultrasound guidance.  What to do after your procedure:   NO anti-inflammatories (Ibuprofen/Motrin, Aleve, Meloxicam, etc.) for 2 weeks after injection. It is OK to use Tylenol only if needed. I can also prescribe you specific medicine to control any discomfort you may have after the procedure if needed.   NO applying ice to the affected area for 2 weeks after the injection. It is OK to use heat.   Rest the affected body part. By keeping the body part treated relaxed and not loading the body part the PRP can bind in place and do its job. Be sure to ask Dr. Shon Baton specific post-injection rest  and guidelines to follow following your injection, as these will differ slightly depending on what type of PRP injection you receive. In general:   Allow 3-4 days of rest from physical labor or repetitive activity for the affected body part. It is ok to move the area, but no lifting or strenuous activity. After 3 days, unless otherwise instructed, the treated body part should be used and slowly moved through its full range of motion. It will be sore, but you will not damage it by moving it, in fact it needs to move to heal.   No strenuous activity, lifting weights, or dedicated therapy until the 2-week mark from the injection. After 2 weeks from the injection, this is when it is most advantageous to start physical therapy.   After two weeks, you may begin returning to activity, but it is a good idea to avoid activities that specifically hurt you before being treated. Exercise is vital to good health and finding a way to cross train around your injury is important not only for your physical health,  but your mental health as well. Ask me about cross training options for your injury. Some brief (10 minutes or less) period of heat therapy will not hurt the rehab, but it is not required. Usually, depending on the initial injury, physical therapy is started from two weeks to three weeks after injection. Improvements in pain and function should be expected from 6 weeks to 12 weeks after injection and some injuries may require more than one treatment.

## 2023-07-17 NOTE — Progress Notes (Signed)
   Procedure Note  HPI: Kayla Archer is a pleasant 57 year-old female here for left knee pain with OA and for Trivisc injection #1 today.  Does have MRI for R-knee scheduled for 09/18/23. She is interested in proceeding with Trivisc and subsequently PRP into R-knee before the MRI.  Patient: Kayla Archer             Date of Birth: Jun 01, 1966           MRN: [id number]             Visit Date: 07/17/2023  Procedures: Visit Diagnoses:  1. Bilateral primary osteoarthritis of knee   2. Chronic pain of left knee    Large Joint Inj: L knee on 07/17/2023 9:50 AM Indications: pain Details: 22 G 1.5 in needle, anterolateral approach Medications: 2 mL lidocaine 1 %; 25 mg Sodium Hyaluronate (Viscosup) 25 MG/2.5ML Outcome: tolerated well, no immediate complications  Technically successful knee injection with TriVisc. Procedure, treatment alternatives, risks and benefits explained, specific risks discussed. Consent was given by the patient. Immediately prior to procedure a time out was called to verify the correct patient, procedure, equipment, support staff and site/side marked as required. Patient was prepped and draped in the usual sterile fashion.     This patient is diagnosed with osteoarthritis of the right and left knee.    Radiographs show evidence of joint space narrowing, osteophytes, subchondral sclerosis and/or subchondral cysts.  This patient has knee pain which interferes with functional and activities of daily living.    This patient has experienced inadequate response, adverse effects and/or intolerance with conservative treatments such as acetaminophen, NSAIDS, topical creams, physical therapy or regular exercise, knee bracing and/or weight loss.   This patient has experienced inadequate response or has a contraindication to intra articular steroid injections for at least 3 months.   This patient is not scheduled to have a total knee replacement within 6 months of starting treatment  with viscosupplementation.  Madelyn Brunner, DO Primary Care Sports Medicine Physician  [facility]  This note was dictated using Dragon naturally speaking software and may contain errors in syntax, spelling, or content which have not been identified prior to signing this note.

## 2023-07-17 NOTE — Telephone Encounter (Signed)
Patient requests trivisc for Right knee. She just had 1st left knee injection today.  Please submit-[place]

## 2023-07-17 NOTE — Telephone Encounter (Signed)
Application for TriVisc, right knee has been submitted to [facility].

## 2023-07-23 DIAGNOSIS — H0012 Chalazion right lower eyelid: Secondary | ICD-10-CM | POA: Diagnosis not present

## 2023-07-23 DIAGNOSIS — H0014 Chalazion left upper eyelid: Secondary | ICD-10-CM | POA: Diagnosis not present

## 2023-07-23 DIAGNOSIS — H0011 Chalazion right upper eyelid: Secondary | ICD-10-CM | POA: Diagnosis not present

## 2023-07-24 ENCOUNTER — Encounter: Payer: Self-pay | Admitting: Cardiology

## 2023-07-24 ENCOUNTER — Encounter: Payer: Self-pay | Admitting: Sports Medicine

## 2023-07-24 ENCOUNTER — Ambulatory Visit: Payer: BC Managed Care – PPO | Admitting: Sports Medicine

## 2023-07-24 DIAGNOSIS — M170 Bilateral primary osteoarthritis of knee: Secondary | ICD-10-CM

## 2023-07-24 DIAGNOSIS — G8929 Other chronic pain: Secondary | ICD-10-CM

## 2023-07-24 DIAGNOSIS — M1711 Unilateral primary osteoarthritis, right knee: Secondary | ICD-10-CM | POA: Diagnosis not present

## 2023-07-24 DIAGNOSIS — M1712 Unilateral primary osteoarthritis, left knee: Secondary | ICD-10-CM | POA: Diagnosis not present

## 2023-07-24 MED ORDER — SODIUM HYALURONATE (VISCOSUP) 25 MG/2.5ML IX SOSY
25.0000 mg | PREFILLED_SYRINGE | INTRA_ARTICULAR | Status: AC | PRN
Start: 2023-07-24 — End: 2023-07-24
  Administered 2023-07-24: 25 mg via INTRA_ARTICULAR

## 2023-07-24 NOTE — Progress Notes (Signed)
   Procedure Note  Patient: Kayla Archer             Date of Birth: 25-Aug-1966           MRN: [id number]             Visit Date: 07/24/2023  Procedures: Visit Diagnoses:  1. Bilateral primary osteoarthritis of knee   2. Bilateral chronic knee pain    Large Joint Inj: L knee on 07/24/2023 11:30 AM Indications: pain Details: 22 G 1.5 in needle, anterolateral approach Medications: 25 mg Sodium Hyaluronate (Viscosup) 25 MG/2.5ML Outcome: tolerated well, no immediate complications Procedure, treatment alternatives, risks and benefits explained, specific risks discussed. Consent was given by the patient. Immediately prior to procedure a time out was called to verify the correct patient, procedure, equipment, support staff and site/side marked as required. Patient was prepped and draped in the usual sterile fashion.    Large Joint Inj: R knee on 07/24/2023 11:31 AM Indications: pain Details: 22 G 1.5 in needle, anteromedial approach Medications: 25 mg Sodium Hyaluronate (Viscosup) 25 MG/2.5ML Outcome: tolerated well, no immediate complications Procedure, treatment alternatives, risks and benefits explained, specific risks discussed. Consent was given by the patient. Immediately prior to procedure a time out was called to verify the correct patient, procedure, equipment, support staff and site/side marked as required. Patient was prepped and draped in the usual sterile fashion.     - tolerated well, discussed post-injection care - will have patient f/u in 2 weeks for L-knee US guided Trivisc, then PRP - Right knee Trivisc at that time as well  Madelyn Brunner, DO Primary Care Sports Medicine Physician  [facility] - Orthopedics  This note was dictated using Dragon naturally speaking software and may contain errors in syntax, spelling, or content which have not been identified prior to signing this note.

## 2023-07-24 NOTE — Telephone Encounter (Signed)
From patient.

## 2023-07-25 ENCOUNTER — Other Ambulatory Visit: Payer: Self-pay

## 2023-07-25 DIAGNOSIS — M25561 Pain in right knee: Secondary | ICD-10-CM

## 2023-08-05 ENCOUNTER — Encounter: Payer: Self-pay | Admitting: Cardiology

## 2023-08-05 DIAGNOSIS — U071 COVID-19: Secondary | ICD-10-CM | POA: Diagnosis not present

## 2023-08-05 NOTE — Telephone Encounter (Signed)
From patient.

## 2023-08-08 ENCOUNTER — Ambulatory Visit: Payer: BC Managed Care – PPO | Admitting: Sports Medicine

## 2023-08-14 ENCOUNTER — Encounter: Payer: Self-pay | Admitting: Sports Medicine

## 2023-08-14 ENCOUNTER — Ambulatory Visit: Payer: BC Managed Care – PPO | Admitting: Sports Medicine

## 2023-08-14 DIAGNOSIS — Z87828 Personal history of other (healed) physical injury and trauma: Secondary | ICD-10-CM

## 2023-08-14 DIAGNOSIS — M25562 Pain in left knee: Secondary | ICD-10-CM

## 2023-08-14 DIAGNOSIS — M170 Bilateral primary osteoarthritis of knee: Secondary | ICD-10-CM

## 2023-08-14 DIAGNOSIS — G8929 Other chronic pain: Secondary | ICD-10-CM

## 2023-08-14 MED ORDER — SODIUM HYALURONATE (VISCOSUP) 25 MG/2.5ML IX SOSY
25.0000 mg | PREFILLED_SYRINGE | INTRA_ARTICULAR | Status: AC | PRN
Start: 2023-08-14 — End: 2023-08-14
  Administered 2023-08-14: 25 mg via INTRA_ARTICULAR

## 2023-08-14 NOTE — Patient Instructions (Addendum)
Dr. Shon Baton' Guide for Management of Knee Osteoarthritis:  1.)  Keep your body moving - keep working on flexibility and range of motion for the knee.  Good physical activity exercise include: stationary bike, swimming, elliptical. Yoga and Pilates are great activities as well.  2.)  Maintain a healthy weight --> 1 pound of body weight = 4 pounds of weight on the knees.  Even small weight loss (5-10+ lbs) can significantly improve pain  3.)  Supplements helpful for arthritis and inflammation: Turmeric (curcumin) Boswellia serrata, collagen hydrolysate, Glucosamine-chondroitin  4.)  Foods helpful for arthritis and inflammation: Fish and foods containing omega-3's, leafy greens (broccoli, spinach), citrus fruits (grapefruit, oranges, lemons), green tea, blueberries, cherries, olive oil (EVO)  *It is very important to stay hydrated, drinking lots of water throughout the day. This helps hydrate structures within the knee.  5.) Medicines:    - Topical pain relievers: Voltaren gel, Arnica gel, Tiger balm, lidocaine/IcyHot patches    - Anti-inflammatories like Aleve, Motrin, ibuprofen --> we can consider prescription based NSAIDs as well if needed (holding now with PRP planned)

## 2023-08-14 NOTE — Progress Notes (Signed)
   Office & Procedure Note  Patient: Kayla Archer             Date of Birth: 01-09-1966           MRN: [id number]             Visit Date: 08/14/2023  HPI: Kayla Archer is a pleasant 57 year-old female who presents for bilateral knee pain and follow-up. Planned TriVisc injection as well today.  PE:   GEN: Well-appearing, in no acute distress; non-toxic CV: Regular Rate. Well-perfused. Warm.  Resp: Breathing unlabored on room air; no wheezing. Psych: Fluid speech in conversation; appropriate affect; normal thought process MSK (Knees): There is no joint effusion noted bilateral knees.  There is full range of motion of both knees ROM 0-135 degrees.  Continued right medial joint line tenderness.  Visit Diagnoses:  1. Chronic pain of left knee   2. Bilateral primary osteoarthritis of knee   3. History of meniscal tear    Procedures: Large Joint Inj: L knee on 08/14/2023 9:15 AM Details: 22 G 1.5 in needle, anterolateral approach Medications: 25 mg Sodium Hyaluronate (Viscosup) 25 MG/2.5ML Outcome: tolerated well, no immediate complications  Anterolateral approach - technically successful TriVisc injection into left knee. Procedure, treatment alternatives, risks and benefits explained, specific risks discussed. Consent was given by the patient. Immediately prior to procedure a time out was called to verify the correct patient, procedure, equipment, support staff and site/side marked as required. Patient was prepped and draped in the usual sterile fashion.      Plan: -Discussed with Echo the treatment plan for both of her knees.  She fortunately did have her MRI of the right knee moved up to 08/20/2023.  Given this I do not want to proceed with viscosupplementation injection for that knee specifically until after her MRI.  We will hold on this 1 for now. -We did proceed with her third and final TriVisc injection for the left knee today, patient tolerated well -We had to delay her PRP given  that she took NSAIDs given her recent viral illness, she will set up left knee PRP injection 10 days from her last ibuprofen/Motrin dose at her leisure.  We will plan to get her into dedicated physical therapy about 2 weeks after PRP injection -We did discuss and I gave her my handout for appropriate physical activity, holistic treatment options for her bilateral knee pain (see AVS) -In terms of the right knee, we will follow-up after her MRI to review results and discuss next steps -She may use ice and/or Tylenol for any postinjection pain, will hold on NSAIDs given her planned PRP -We will plan to complete her second or third TriVisc injection into the right knee after her MRI results. Patient is agreeable to plan.  Madelyn Brunner, DO Primary Care Sports Medicine Physician  [facility] - Orthopedics  This note was dictated using Dragon naturally speaking software and may contain errors in syntax, spelling, or content which have not been identified prior to signing this note.

## 2023-08-16 ENCOUNTER — Ambulatory Visit (HOSPITAL_COMMUNITY): Payer: BC Managed Care – PPO

## 2023-08-20 ENCOUNTER — Ambulatory Visit (HOSPITAL_COMMUNITY)
Admission: RE | Admit: 2023-08-20 | Discharge: 2023-08-20 | Disposition: A | Discharge status: Home / Self Care | Payer: BC Managed Care – PPO | Source: Ambulatory Visit | Attending: Sports Medicine | Admitting: Sports Medicine

## 2023-08-20 ENCOUNTER — Ambulatory Visit (HOSPITAL_COMMUNITY)
Admission: RE | Admit: 2023-08-20 | Discharge: 2023-08-20 | Disposition: A | Discharge status: Home / Self Care | Payer: BC Managed Care – PPO | Source: Ambulatory Visit | Attending: Student | Admitting: Student

## 2023-08-20 DIAGNOSIS — M948X6 Other specified disorders of cartilage, lower leg: Secondary | ICD-10-CM | POA: Diagnosis not present

## 2023-08-20 DIAGNOSIS — S83241A Other tear of medial meniscus, current injury, right knee, initial encounter: Secondary | ICD-10-CM | POA: Diagnosis not present

## 2023-08-20 DIAGNOSIS — M1711 Unilateral primary osteoarthritis, right knee: Secondary | ICD-10-CM | POA: Diagnosis not present

## 2023-08-20 DIAGNOSIS — Z87828 Personal history of other (healed) physical injury and trauma: Secondary | ICD-10-CM | POA: Diagnosis not present

## 2023-08-20 DIAGNOSIS — Z950 Presence of cardiac pacemaker: Secondary | ICD-10-CM | POA: Insufficient documentation

## 2023-08-20 DIAGNOSIS — M25461 Effusion, right knee: Secondary | ICD-10-CM | POA: Diagnosis not present

## 2023-08-30 ENCOUNTER — Encounter: Payer: Self-pay | Admitting: Sports Medicine

## 2023-09-06 ENCOUNTER — Encounter: Payer: Self-pay | Admitting: Sports Medicine

## 2023-09-06 ENCOUNTER — Other Ambulatory Visit: Payer: Self-pay | Admitting: Sports Medicine

## 2023-09-06 DIAGNOSIS — R29898 Other symptoms and signs involving the musculoskeletal system: Secondary | ICD-10-CM

## 2023-09-06 DIAGNOSIS — M170 Bilateral primary osteoarthritis of knee: Secondary | ICD-10-CM

## 2023-09-06 DIAGNOSIS — S83241D Other tear of medial meniscus, current injury, right knee, subsequent encounter: Secondary | ICD-10-CM

## 2023-09-11 DIAGNOSIS — M25562 Pain in left knee: Secondary | ICD-10-CM | POA: Diagnosis not present

## 2023-09-11 DIAGNOSIS — G8929 Other chronic pain: Secondary | ICD-10-CM | POA: Diagnosis not present

## 2023-09-18 ENCOUNTER — Ambulatory Visit: Payer: BC Managed Care – PPO | Admitting: Sports Medicine

## 2023-09-18 ENCOUNTER — Other Ambulatory Visit: Payer: Self-pay

## 2023-09-18 ENCOUNTER — Ambulatory Visit (HOSPITAL_COMMUNITY): Payer: BC Managed Care – PPO

## 2023-09-18 DIAGNOSIS — M25561 Pain in right knee: Secondary | ICD-10-CM

## 2023-09-18 DIAGNOSIS — M25562 Pain in left knee: Secondary | ICD-10-CM | POA: Diagnosis not present

## 2023-09-18 DIAGNOSIS — M1711 Unilateral primary osteoarthritis, right knee: Secondary | ICD-10-CM | POA: Diagnosis not present

## 2023-09-18 DIAGNOSIS — G8929 Other chronic pain: Secondary | ICD-10-CM

## 2023-09-18 DIAGNOSIS — S83241D Other tear of medial meniscus, current injury, right knee, subsequent encounter: Secondary | ICD-10-CM

## 2023-09-18 DIAGNOSIS — M170 Bilateral primary osteoarthritis of knee: Secondary | ICD-10-CM

## 2023-09-18 MED ORDER — METHYLPREDNISOLONE ACETATE 40 MG/ML IJ SUSP
80.0000 mg | INTRAMUSCULAR | Status: AC | PRN
Start: 2023-09-18 — End: 2023-09-18
  Administered 2023-09-18: 80 mg via INTRA_ARTICULAR

## 2023-09-18 MED ORDER — SODIUM HYALURONATE (VISCOSUP) 25 MG/2.5ML IX SOSY
25.0000 mg | PREFILLED_SYRINGE | INTRA_ARTICULAR | Status: AC | PRN
Start: 2023-09-18 — End: 2023-09-18
  Administered 2023-09-18: 25 mg via INTRA_ARTICULAR

## 2023-09-18 NOTE — Addendum Note (Signed)
Addended by: Albertha Ghee E on: 09/18/2023 01:09 PM   Modules accepted: Orders

## 2023-09-18 NOTE — Addendum Note (Signed)
Addended by: Albertha Ghee E on: 09/18/2023 01:02 PM   Modules accepted: Orders

## 2023-09-18 NOTE — Progress Notes (Addendum)
Office & Procedure Note  Patient: Kayla Archer             Date of Birth: 1966-02-01           MRN: [id number]             Visit Date: 09/18/2023  HPI: Kayla Archer is a very pleasant 57 year-old female who presents today for follow-up of bilateral knee pain.  We are here to review her right knee MRI as well.  Her right knee had always been the most bothersome one, although here more recently both knees have continued to give her considerable pain, this is limiting her function and daily activity.  She does note she is feeling quite down about the situation and this is significantly affecting both her physical and mental well-being.  As previously discussed, we are proceeding with right knee TriVisc, viscosupplementation, injection today.  As well as moving forward with PRP for her left knee.  PE: Evaluation of bilateral knees shows trace effusion bilaterally.  There is bilateral patellar crepitus.  Range of motion is preserved at 0-135 degrees of bilateral knees.  There is joint line tenderness of both knees, medial greater than lateral.  Imaging:  MR Knee Right w/o contrast CLINICAL DATA:  Chronic right knee pain. History of meniscal tear and previous arthroscopy.  EXAM: MRI OF THE RIGHT KNEE WITHOUT CONTRAST  TECHNIQUE: Multiplanar, multisequence MR imaging of the knee was performed. No intravenous contrast was administered.  COMPARISON:  Right knee x-rays dated February 14, 2022. MRI right knee dated October 14, 2017.  FINDINGS: MENISCI  Medial meniscus: Postsurgical changes of the posterior horn with progressive blunting and irregularity of the free edge, consistent with new radial tear. Unchanged small parameniscal cyst near the body/posterior horn junction. Slightly progressive extrusion of the body.  Lateral meniscus:  Intact.  LIGAMENTS  Cruciates:  Intact ACL and PCL.  Collaterals: Medial collateral ligament is intact. Lateral collateral ligament complex is  intact.  CARTILAGE  Patellofemoral: Progressive cartilage thinning over the entire patella and lateral trochlea. New focal full-thickness cartilage fissuring and delamination over the lateral patellar facet and superior lateral trochlea.  Medial: Progressive now full-thickness cartilage loss over the central and peripheral weight-bearing medial femoral condyle and peripheral medial tibial plateau.  Lateral: Progressive mild-to-moderate partial-thickness cartilage loss over the central and peripheral lateral femoral condyle.  Joint:  New trace to small joint effusion.  Popliteal Fossa:  No Baker cyst. Intact popliteus tendon.  Extensor Mechanism: Intact quadriceps tendon and patellar tendon. Intact medial and lateral patellar retinaculum. Intact MPFL.  Bones: No acute fracture or dislocation. No suspicious bone lesion.  Other: Slight interval increase in size of a complex 2.6 cm ganglion cyst posterior to the PCL.  IMPRESSION: 1. Postsurgical changes of the medial meniscus posterior horn with new radial tear. Unchanged small parameniscal cyst near the body/posterior horn junction. 2. Progressive tricompartmental osteoarthritis, moderate in the medial compartment.  Electronically Signed   By: Kayla Archer M.D.   On: 08/26/2023 16:00  Procedures: Visit Diagnoses:  1. Bilateral primary osteoarthritis of knee   2. Acute medial meniscus tear of right knee, subsequent encounter   3. Bilateral chronic knee pain   4. Chronic pain of left knee    Large Joint Inj: R knee on 09/18/2023 10:11 AM Indications: pain Details: 22 G 1.5 in needle, anterolateral approach Medications: 80 mg methylPREDNISolone acetate 40 MG/ML; 25 mg Sodium Hyaluronate (Viscosup) 25 MG/2.5ML Outcome: tolerated well, no immediate complications  Technically successful  TriVisc right knee intra-articular injection Procedure, treatment alternatives, risks and benefits explained, specific risks discussed.  Consent was given by the patient. Immediately prior to procedure a time out was called to verify the correct patient, procedure, equipment, support staff and site/side marked as required. Patient was prepped and draped in the usual sterile fashion.     Ultrasound-guided PRP Knee Injection, Left Knee: After discussion on risks/benefits/indications, informed verbal consent was obtained and a timeout was performed. The patient was lying supine on exam table with knee bolster underneath affected knee for comfort. The patient's right knee was prepped with Chloraprep and alcohol swabs. Utilizing ultrasound-guidance with the probe in a transverse position, the patient's suprapatellar bursa was identified and the surrounding soft tissue area was anesthesized first with a 25G, 1.5" needle with approximately 3 cc of lidocaine 1% using sterile technique, but none was delivered into the knee joint or bursa itself. Following this, using ultrasound guidance via an in-plane approach, a 22G, 1.5" needle was directed into the suprapatellar pouch of the knee joint and subsequently injected intraarticularly with 5.5 cc of platelet-rich-plasma (leukocyte-poor). Visualization of injectate spread within the knee joint was visualized dynamically under ultrasound guidance. Patient tolerated the procedure well without immediate complications.     Kit: RegenLab: RegenPlasma; BCT-3 kit   *Post-PRP Injection Guidelines: No anti-inflammatories (ibuprofen/motrin, aleve, meloxicam, etc.) for 2 weeks.  No ice for 2 weeks.  Short prescription of tramadol may be written if pain is severe, call if needed. Appropriate rest / bracing / and timeframe for beginning therapy discussed and patient endorsed understanding.   Plan: -We did proceed with TriVisc injection into the right knee today, tolerated well.  At this time, given some of the fullness from the third injection of the left knee, she would like to hold off on further  viscosupplementation for the right knee. -Through shared decision-making, we did proceed with ultrasound-guided left knee PRP injection, patient tolerated well.  See procedure note and imaging above. -We had a lengthy discussion regarding her bilateral knee pain.  Her MRI does show a medial meniscus tear of the posterior horn within the radial sided tear, however more notably she has high-grade cartilage loss in the medial tibiofemoral joint as well as progressive tricompartmental arthritic change.  Given this, if her PRP and PT does not significantly improved her pain and function I think the only logical next step would be consideration of total knee replacement -I do think it is pertinent given her chronic pain and limited relief from conservative measures to obtain an MRI of the left knee to evaluate meniscus and cartilage structures of this knee, especially given what we found on the right side.  She does have an MRI compatible pacemaker, will order this today -Referral previously sent for formalized physical therapy.  I would like her to hold on starting therapy for the left knee until at least 10 days out from her PRP injection.  She may start this for the right knee however at this time. -He is interested in proceeding with PRP for the right contralateral knee, she can schedule this at her convenience at least 10 days from today's procedure  Madelyn Brunner, DO Primary Care Sports Medicine Physician  [facility] - Orthopedics  This note was dictated using Dragon naturally speaking software and may contain errors in syntax, spelling, or content which have not been identified prior to signing this note.

## 2023-09-20 ENCOUNTER — Encounter: Payer: Self-pay | Admitting: Sports Medicine

## 2023-09-23 ENCOUNTER — Ambulatory Visit: Payer: BC Managed Care – PPO | Admitting: Physical Therapy

## 2023-09-30 ENCOUNTER — Encounter: Payer: Self-pay | Admitting: Sports Medicine

## 2023-10-02 ENCOUNTER — Ambulatory Visit: Payer: BC Managed Care – PPO | Admitting: Sports Medicine

## 2023-10-08 DIAGNOSIS — M25561 Pain in right knee: Secondary | ICD-10-CM | POA: Diagnosis not present

## 2023-10-08 DIAGNOSIS — B9689 Other specified bacterial agents as the cause of diseases classified elsewhere: Secondary | ICD-10-CM | POA: Diagnosis not present

## 2023-10-08 DIAGNOSIS — J208 Acute bronchitis due to other specified organisms: Secondary | ICD-10-CM | POA: Diagnosis not present

## 2023-10-08 DIAGNOSIS — Z950 Presence of cardiac pacemaker: Secondary | ICD-10-CM | POA: Diagnosis not present

## 2023-10-08 DIAGNOSIS — J019 Acute sinusitis, unspecified: Secondary | ICD-10-CM | POA: Diagnosis not present

## 2023-10-08 DIAGNOSIS — Z953 Presence of xenogenic heart valve: Secondary | ICD-10-CM | POA: Diagnosis not present

## 2023-10-17 DIAGNOSIS — M170 Bilateral primary osteoarthritis of knee: Secondary | ICD-10-CM | POA: Diagnosis not present

## 2023-10-22 DIAGNOSIS — Z760 Encounter for issue of repeat prescription: Secondary | ICD-10-CM | POA: Diagnosis not present

## 2023-10-22 DIAGNOSIS — R6882 Decreased libido: Secondary | ICD-10-CM | POA: Diagnosis not present

## 2023-10-29 ENCOUNTER — Ambulatory Visit (HOSPITAL_COMMUNITY)
Admission: RE | Admit: 2023-10-29 | Discharge: 2023-10-29 | Disposition: A | Discharge status: Home / Self Care | Payer: BC Managed Care – PPO | Source: Ambulatory Visit | Attending: Sports Medicine | Admitting: Sports Medicine

## 2023-10-29 DIAGNOSIS — G8929 Other chronic pain: Secondary | ICD-10-CM | POA: Insufficient documentation

## 2023-10-29 DIAGNOSIS — M25462 Effusion, left knee: Secondary | ICD-10-CM | POA: Diagnosis not present

## 2023-10-29 DIAGNOSIS — M25562 Pain in left knee: Secondary | ICD-10-CM | POA: Diagnosis not present

## 2023-10-29 DIAGNOSIS — M1712 Unilateral primary osteoarthritis, left knee: Secondary | ICD-10-CM | POA: Diagnosis not present

## 2023-10-29 DIAGNOSIS — S83232A Complex tear of medial meniscus, current injury, left knee, initial encounter: Secondary | ICD-10-CM | POA: Diagnosis not present

## 2023-11-05 ENCOUNTER — Encounter: Payer: Self-pay | Admitting: Sports Medicine

## 2023-11-13 ENCOUNTER — Ambulatory Visit: Payer: BC Managed Care – PPO

## 2023-11-13 DIAGNOSIS — I442 Atrioventricular block, complete: Secondary | ICD-10-CM

## 2023-11-15 LAB — CUP PACEART REMOTE DEVICE CHECK
Battery Remaining Longevity: 74 mo
Battery Voltage: 2.97 V
Brady Statistic AP VP Percent: 41.37 %
Brady Statistic AP VS Percent: 0 %
Brady Statistic AS VP Percent: 58.63 %
Brady Statistic AS VS Percent: 0.01 %
Brady Statistic RA Percent Paced: 41.3 %
Brady Statistic RV Percent Paced: 99.99 %
Date Time Interrogation Session: 20241204092225
Implantable Pulse Generator Implant Date: 20180522
Lead Channel Impedance Value: 304 Ohm
Lead Channel Impedance Value: 342 Ohm
Lead Channel Impedance Value: 513 Ohm
Lead Channel Impedance Value: 646 Ohm
Lead Channel Pacing Threshold Amplitude: 0.375 V
Lead Channel Pacing Threshold Amplitude: 0.75 V
Lead Channel Pacing Threshold Pulse Width: 0.4 ms
Lead Channel Pacing Threshold Pulse Width: 0.4 ms
Lead Channel Sensing Intrinsic Amplitude: 1.75 mV
Lead Channel Sensing Intrinsic Amplitude: 1.75 mV
Lead Channel Sensing Intrinsic Amplitude: 8.875 mV
Lead Channel Sensing Intrinsic Amplitude: 8.875 mV
Lead Channel Setting Pacing Amplitude: 1.5 V
Lead Channel Setting Pacing Amplitude: 2 V
Lead Channel Setting Pacing Pulse Width: 0.4 ms
Lead Channel Setting Sensing Sensitivity: 0.9 mV
Zone Setting Status: 755011
Zone Setting Status: 755011

## 2023-11-20 DIAGNOSIS — M170 Bilateral primary osteoarthritis of knee: Secondary | ICD-10-CM | POA: Diagnosis not present

## 2023-11-26 DIAGNOSIS — M6281 Muscle weakness (generalized): Secondary | ICD-10-CM | POA: Diagnosis not present

## 2023-11-26 DIAGNOSIS — M25562 Pain in left knee: Secondary | ICD-10-CM | POA: Diagnosis not present

## 2023-11-26 DIAGNOSIS — M25561 Pain in right knee: Secondary | ICD-10-CM | POA: Diagnosis not present

## 2023-12-02 DIAGNOSIS — M25561 Pain in right knee: Secondary | ICD-10-CM | POA: Diagnosis not present

## 2023-12-02 DIAGNOSIS — M25562 Pain in left knee: Secondary | ICD-10-CM | POA: Diagnosis not present

## 2023-12-02 DIAGNOSIS — M6281 Muscle weakness (generalized): Secondary | ICD-10-CM | POA: Diagnosis not present

## 2023-12-10 DIAGNOSIS — M25561 Pain in right knee: Secondary | ICD-10-CM | POA: Diagnosis not present

## 2023-12-10 DIAGNOSIS — M6281 Muscle weakness (generalized): Secondary | ICD-10-CM | POA: Diagnosis not present

## 2023-12-10 DIAGNOSIS — M25562 Pain in left knee: Secondary | ICD-10-CM | POA: Diagnosis not present

## 2023-12-12 DIAGNOSIS — M25562 Pain in left knee: Secondary | ICD-10-CM | POA: Diagnosis not present

## 2023-12-12 DIAGNOSIS — M25561 Pain in right knee: Secondary | ICD-10-CM | POA: Diagnosis not present

## 2023-12-12 DIAGNOSIS — M6281 Muscle weakness (generalized): Secondary | ICD-10-CM | POA: Diagnosis not present

## 2023-12-13 ENCOUNTER — Telehealth: Payer: Self-pay | Admitting: Sports Medicine

## 2023-12-13 NOTE — Telephone Encounter (Signed)
 Received vm from patient wanting [place] to "push" MRI images through powershare. IC,lmvm advising that we do not use powershare, that she will need to obtain the images from the facility where MRI's were done.

## 2023-12-16 DIAGNOSIS — M25461 Effusion, right knee: Secondary | ICD-10-CM | POA: Diagnosis not present

## 2023-12-16 DIAGNOSIS — M170 Bilateral primary osteoarthritis of knee: Secondary | ICD-10-CM | POA: Diagnosis not present

## 2023-12-17 DIAGNOSIS — M25562 Pain in left knee: Secondary | ICD-10-CM | POA: Diagnosis not present

## 2023-12-17 DIAGNOSIS — M25561 Pain in right knee: Secondary | ICD-10-CM | POA: Diagnosis not present

## 2023-12-17 DIAGNOSIS — M6281 Muscle weakness (generalized): Secondary | ICD-10-CM | POA: Diagnosis not present

## 2023-12-19 DIAGNOSIS — M25562 Pain in left knee: Secondary | ICD-10-CM | POA: Diagnosis not present

## 2023-12-19 DIAGNOSIS — M6281 Muscle weakness (generalized): Secondary | ICD-10-CM | POA: Diagnosis not present

## 2023-12-19 DIAGNOSIS — M25561 Pain in right knee: Secondary | ICD-10-CM | POA: Diagnosis not present

## 2023-12-24 DIAGNOSIS — M6281 Muscle weakness (generalized): Secondary | ICD-10-CM | POA: Diagnosis not present

## 2023-12-24 DIAGNOSIS — M25562 Pain in left knee: Secondary | ICD-10-CM | POA: Diagnosis not present

## 2023-12-24 DIAGNOSIS — M25561 Pain in right knee: Secondary | ICD-10-CM | POA: Diagnosis not present

## 2023-12-26 DIAGNOSIS — M25562 Pain in left knee: Secondary | ICD-10-CM | POA: Diagnosis not present

## 2023-12-26 DIAGNOSIS — M6281 Muscle weakness (generalized): Secondary | ICD-10-CM | POA: Diagnosis not present

## 2023-12-26 DIAGNOSIS — M25561 Pain in right knee: Secondary | ICD-10-CM | POA: Diagnosis not present

## 2023-12-30 ENCOUNTER — Encounter: Payer: Self-pay | Admitting: Cardiology

## 2023-12-31 DIAGNOSIS — M25562 Pain in left knee: Secondary | ICD-10-CM | POA: Diagnosis not present

## 2023-12-31 DIAGNOSIS — M25561 Pain in right knee: Secondary | ICD-10-CM | POA: Diagnosis not present

## 2023-12-31 DIAGNOSIS — M6281 Muscle weakness (generalized): Secondary | ICD-10-CM | POA: Diagnosis not present

## 2024-01-02 DIAGNOSIS — M6281 Muscle weakness (generalized): Secondary | ICD-10-CM | POA: Diagnosis not present

## 2024-01-02 DIAGNOSIS — M25561 Pain in right knee: Secondary | ICD-10-CM | POA: Diagnosis not present

## 2024-01-02 DIAGNOSIS — M25562 Pain in left knee: Secondary | ICD-10-CM | POA: Diagnosis not present

## 2024-01-07 DIAGNOSIS — M25561 Pain in right knee: Secondary | ICD-10-CM | POA: Diagnosis not present

## 2024-01-07 DIAGNOSIS — M6281 Muscle weakness (generalized): Secondary | ICD-10-CM | POA: Diagnosis not present

## 2024-01-07 DIAGNOSIS — M25562 Pain in left knee: Secondary | ICD-10-CM | POA: Diagnosis not present

## 2024-01-09 DIAGNOSIS — M6281 Muscle weakness (generalized): Secondary | ICD-10-CM | POA: Diagnosis not present

## 2024-01-09 DIAGNOSIS — M25562 Pain in left knee: Secondary | ICD-10-CM | POA: Diagnosis not present

## 2024-01-09 DIAGNOSIS — M25561 Pain in right knee: Secondary | ICD-10-CM | POA: Diagnosis not present

## 2024-01-14 DIAGNOSIS — M25562 Pain in left knee: Secondary | ICD-10-CM | POA: Diagnosis not present

## 2024-01-14 DIAGNOSIS — M25561 Pain in right knee: Secondary | ICD-10-CM | POA: Diagnosis not present

## 2024-01-14 DIAGNOSIS — M6281 Muscle weakness (generalized): Secondary | ICD-10-CM | POA: Diagnosis not present

## 2024-01-15 DIAGNOSIS — M6281 Muscle weakness (generalized): Secondary | ICD-10-CM | POA: Diagnosis not present

## 2024-01-15 DIAGNOSIS — M25562 Pain in left knee: Secondary | ICD-10-CM | POA: Diagnosis not present

## 2024-01-15 DIAGNOSIS — M25561 Pain in right knee: Secondary | ICD-10-CM | POA: Diagnosis not present

## 2024-01-21 DIAGNOSIS — M25561 Pain in right knee: Secondary | ICD-10-CM | POA: Diagnosis not present

## 2024-01-21 DIAGNOSIS — M25562 Pain in left knee: Secondary | ICD-10-CM | POA: Diagnosis not present

## 2024-01-21 DIAGNOSIS — M6281 Muscle weakness (generalized): Secondary | ICD-10-CM | POA: Diagnosis not present

## 2024-01-27 DIAGNOSIS — M25561 Pain in right knee: Secondary | ICD-10-CM | POA: Diagnosis not present

## 2024-01-27 DIAGNOSIS — M6281 Muscle weakness (generalized): Secondary | ICD-10-CM | POA: Diagnosis not present

## 2024-01-27 DIAGNOSIS — M25562 Pain in left knee: Secondary | ICD-10-CM | POA: Diagnosis not present

## 2024-02-03 DIAGNOSIS — M25561 Pain in right knee: Secondary | ICD-10-CM | POA: Diagnosis not present

## 2024-02-03 DIAGNOSIS — M6281 Muscle weakness (generalized): Secondary | ICD-10-CM | POA: Diagnosis not present

## 2024-02-03 DIAGNOSIS — M25562 Pain in left knee: Secondary | ICD-10-CM | POA: Diagnosis not present

## 2024-02-06 DIAGNOSIS — M25561 Pain in right knee: Secondary | ICD-10-CM | POA: Diagnosis not present

## 2024-02-06 DIAGNOSIS — M25562 Pain in left knee: Secondary | ICD-10-CM | POA: Diagnosis not present

## 2024-02-06 DIAGNOSIS — M6281 Muscle weakness (generalized): Secondary | ICD-10-CM | POA: Diagnosis not present

## 2024-02-11 DIAGNOSIS — M6281 Muscle weakness (generalized): Secondary | ICD-10-CM | POA: Diagnosis not present

## 2024-02-11 DIAGNOSIS — M25562 Pain in left knee: Secondary | ICD-10-CM | POA: Diagnosis not present

## 2024-02-11 DIAGNOSIS — M25561 Pain in right knee: Secondary | ICD-10-CM | POA: Diagnosis not present

## 2024-02-12 ENCOUNTER — Ambulatory Visit (INDEPENDENT_AMBULATORY_CARE_PROVIDER_SITE_OTHER): Payer: BC Managed Care – PPO

## 2024-02-12 DIAGNOSIS — I442 Atrioventricular block, complete: Secondary | ICD-10-CM

## 2024-02-13 DIAGNOSIS — M170 Bilateral primary osteoarthritis of knee: Secondary | ICD-10-CM | POA: Diagnosis not present

## 2024-02-14 LAB — CUP PACEART REMOTE DEVICE CHECK
Battery Remaining Longevity: 69 mo
Battery Voltage: 2.97 V
Brady Statistic AP VP Percent: 41.14 %
Brady Statistic AP VS Percent: 0 %
Brady Statistic AS VP Percent: 58.82 %
Brady Statistic AS VS Percent: 0.03 %
Brady Statistic RA Percent Paced: 41.09 %
Brady Statistic RV Percent Paced: 99.96 %
Date Time Interrogation Session: 20250306151024
Implantable Pulse Generator Implant Date: 20180522
Lead Channel Impedance Value: 304 Ohm
Lead Channel Impedance Value: 361 Ohm
Lead Channel Impedance Value: 532 Ohm
Lead Channel Impedance Value: 551 Ohm
Lead Channel Pacing Threshold Amplitude: 0.375 V
Lead Channel Pacing Threshold Amplitude: 0.625 V
Lead Channel Pacing Threshold Pulse Width: 0.4 ms
Lead Channel Pacing Threshold Pulse Width: 0.4 ms
Lead Channel Sensing Intrinsic Amplitude: 2 mV
Lead Channel Sensing Intrinsic Amplitude: 2 mV
Lead Channel Sensing Intrinsic Amplitude: 8.875 mV
Lead Channel Sensing Intrinsic Amplitude: 8.875 mV
Lead Channel Setting Pacing Amplitude: 1.5 V
Lead Channel Setting Pacing Amplitude: 2 V
Lead Channel Setting Pacing Pulse Width: 0.4 ms
Lead Channel Setting Sensing Sensitivity: 0.9 mV
Zone Setting Status: 755011
Zone Setting Status: 755011

## 2024-02-17 DIAGNOSIS — M6281 Muscle weakness (generalized): Secondary | ICD-10-CM | POA: Diagnosis not present

## 2024-02-17 DIAGNOSIS — M25561 Pain in right knee: Secondary | ICD-10-CM | POA: Diagnosis not present

## 2024-02-17 DIAGNOSIS — M25562 Pain in left knee: Secondary | ICD-10-CM | POA: Diagnosis not present

## 2024-03-31 NOTE — Progress Notes (Signed)
 Remote pacemaker transmission.

## 2024-03-31 NOTE — Addendum Note (Signed)
 Addended by: Lott Rouleau A on: 03/31/2024 08:50 AM   Modules accepted: Orders

## 2024-04-06 DIAGNOSIS — M170 Bilateral primary osteoarthritis of knee: Secondary | ICD-10-CM | POA: Diagnosis not present

## 2024-04-06 DIAGNOSIS — M25461 Effusion, right knee: Secondary | ICD-10-CM | POA: Diagnosis not present

## 2024-04-06 DIAGNOSIS — M25462 Effusion, left knee: Secondary | ICD-10-CM | POA: Diagnosis not present

## 2024-04-11 DIAGNOSIS — M1711 Unilateral primary osteoarthritis, right knee: Secondary | ICD-10-CM | POA: Diagnosis not present

## 2024-04-13 ENCOUNTER — Encounter: Payer: Self-pay | Admitting: Cardiology

## 2024-04-14 NOTE — Telephone Encounter (Signed)
 I called the patient and discussed with her regarding her concerns or questions, patient is presently doing well and needs total knee arthroplasty and she had questions regarding preoperative cardiac risk which I feel she is low risk, she is already being scheduled for CT angiogram chest for abdominal aneurysm repair prior to surgery.  She has no known coronary disease, she has normal LVEF.  She is pacemaker dependent but they procedure should not interfere with pacemaker.  Advised her that she is overall low risk but she does have an appointment to see me prior to surgery, I also advised her that she can proceed with having the surgery done at the surgical center and does not need inpatient hospitalization from cardiac standpoint.  All her questions were answered.  She was very grateful.

## 2024-05-13 ENCOUNTER — Ambulatory Visit (INDEPENDENT_AMBULATORY_CARE_PROVIDER_SITE_OTHER): Payer: BC Managed Care – PPO

## 2024-05-13 ENCOUNTER — Telehealth: Payer: Self-pay

## 2024-05-13 DIAGNOSIS — I442 Atrioventricular block, complete: Secondary | ICD-10-CM

## 2024-05-13 LAB — CUP PACEART REMOTE DEVICE CHECK
Battery Remaining Longevity: 63 mo
Battery Voltage: 2.97 V
Brady Statistic AP VP Percent: 39.76 %
Brady Statistic AP VS Percent: 0 %
Brady Statistic AS VP Percent: 60.2 %
Brady Statistic AS VS Percent: 0.03 %
Brady Statistic RA Percent Paced: 39.71 %
Brady Statistic RV Percent Paced: 99.97 %
Date Time Interrogation Session: 20250603220817
Implantable Pulse Generator Implant Date: 20180522
Lead Channel Impedance Value: 323 Ohm
Lead Channel Impedance Value: 361 Ohm
Lead Channel Impedance Value: 380 Ohm
Lead Channel Impedance Value: 380 Ohm
Lead Channel Pacing Threshold Amplitude: 0.5 V
Lead Channel Pacing Threshold Amplitude: 0.625 V
Lead Channel Pacing Threshold Pulse Width: 0.4 ms
Lead Channel Pacing Threshold Pulse Width: 0.4 ms
Lead Channel Sensing Intrinsic Amplitude: 14.625 mV
Lead Channel Sensing Intrinsic Amplitude: 14.625 mV
Lead Channel Sensing Intrinsic Amplitude: 2.25 mV
Lead Channel Sensing Intrinsic Amplitude: 2.25 mV
Lead Channel Setting Pacing Amplitude: 1.5 V
Lead Channel Setting Pacing Amplitude: 2 V
Lead Channel Setting Pacing Pulse Width: 0.4 ms
Lead Channel Setting Sensing Sensitivity: 0.9 mV
Zone Setting Status: 755011
Zone Setting Status: 755011

## 2024-05-13 NOTE — Telephone Encounter (Signed)
   Name: Kayla Archer  DOB: 08-Dec-1966  MRN: [id number]  Primary Cardiologist: None  Chart reviewed as part of pre-operative protocol coverage. Because of Janaisa Welte's past medical history and time since last visit, she will require a follow-up in-office visit in order to better assess preoperative cardiovascular risk.  Pre-op covering staff: - Please schedule appointment and call patient to inform them. If patient already had an upcoming appointment within acceptable timeframe, please add "pre-op clearance" to the appointment notes so provider is aware. - Please contact requesting surgeon's office via preferred method (i.e, phone, fax) to inform them of need for appointment prior to surgery.    Francene Ing, Retha Cast, NP  05/13/2024, 1:48 PM

## 2024-05-13 NOTE — Telephone Encounter (Signed)
1st attempt to reach pt regarding surgical clearance and the need for an IN OFFICE appointment.  Left pt a detailed message to call back and get that scheduled.

## 2024-05-13 NOTE — Telephone Encounter (Signed)
   Pre-operative Risk Assessment    Patient Name: Kayla Archer  DOB: [birth year] MRN: [id number]   Date of last office visit: 06/21/2023 Date of next office visit: Not scheduled   Request for Surgical Clearance    Procedure:  Right total knee replacement  Date of Surgery:  Clearance 07/14/24                                Surgeon:  Dayne Even, M.D. Surgeon's Group or Practice Name:  [facility] Phone number:  [phone] Fax number:  [phone]   Type of Clearance Requested:   - Medical    Type of Anesthesia:  Spinal   Additional requests/questions:    Gardiner Jumper   05/13/2024, 1:09 PM

## 2024-05-14 NOTE — Telephone Encounter (Signed)
 I will update the requesting office that the pt called and said she is not having her surgery now.

## 2024-05-14 NOTE — Telephone Encounter (Signed)
 Pt will no longer be getting her surgery done at [facility].

## 2024-06-08 ENCOUNTER — Encounter: Payer: Self-pay | Admitting: Cardiology

## 2024-06-08 ENCOUNTER — Telehealth: Payer: Self-pay | Admitting: Cardiology

## 2024-06-08 NOTE — Telephone Encounter (Signed)
 Office is calling for auth #. States that patient appt will have to cancel if they don't have it before 12.

## 2024-06-08 NOTE — Telephone Encounter (Signed)
 Kayla Archer with [facility] needs pre auth for Chest CT -CPT code 28724. She stated she had to cancel patient's test tomorrow due to not being done before 12:00 today. Will send to pre auth.

## 2024-06-09 ENCOUNTER — Inpatient Hospital Stay
Admission: RE | Admit: 2024-06-09 | Discharge: 2024-06-09 | Discharge status: Home / Self Care | Source: Ambulatory Visit | Attending: Cardiology | Admitting: Cardiology

## 2024-06-09 ENCOUNTER — Ambulatory Visit: Payer: Self-pay | Admitting: Cardiology

## 2024-06-09 DIAGNOSIS — Z8679 Personal history of other diseases of the circulatory system: Secondary | ICD-10-CM

## 2024-06-09 DIAGNOSIS — Z9889 Other specified postprocedural states: Secondary | ICD-10-CM | POA: Diagnosis not present

## 2024-06-09 DIAGNOSIS — Z954 Presence of other heart-valve replacement: Secondary | ICD-10-CM

## 2024-06-09 MED ORDER — IOPAMIDOL (ISOVUE-370) INJECTION 76%
75.0000 mL | Freq: Once | INTRAVENOUS | Status: AC | PRN
  Administered 2024-06-09: 75 mL via INTRAVENOUS

## 2024-06-09 NOTE — Progress Notes (Signed)
 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 SVC patent. Heart size normal. No pericardial effusion. Satisfactory opacification of pulmonary arteries noted, and there is no evidence of pulmonary emboli.  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 Descending thoracic aorta unremarkable. Lungs and mediastinum appear normal.  Stable CT angiogram of the chest for aortic aneurysm repair and aortic valve replacement and compared to 07/24/2022 no changes.

## 2024-06-18 ENCOUNTER — Ambulatory Visit: Payer: BC Managed Care – PPO | Admitting: Cardiology

## 2024-07-06 NOTE — Progress Notes (Signed)
 Remote pacemaker transmission.

## 2024-07-09 ENCOUNTER — Encounter: Payer: Self-pay | Admitting: Cardiology

## 2024-07-09 ENCOUNTER — Ambulatory Visit: Attending: Cardiology | Admitting: Cardiology

## 2024-07-09 VITALS — BP 150/80 | HR 88 | Resp 16 | Ht 63.0 in | Wt 134.0 lb

## 2024-07-09 DIAGNOSIS — Z01810 Encounter for preprocedural cardiovascular examination: Secondary | ICD-10-CM | POA: Diagnosis not present

## 2024-07-09 DIAGNOSIS — I4729 Other ventricular tachycardia: Secondary | ICD-10-CM | POA: Diagnosis not present

## 2024-07-09 DIAGNOSIS — Z8679 Personal history of other diseases of the circulatory system: Secondary | ICD-10-CM

## 2024-07-09 DIAGNOSIS — R55 Syncope and collapse: Secondary | ICD-10-CM

## 2024-07-09 DIAGNOSIS — Z9889 Other specified postprocedural states: Secondary | ICD-10-CM

## 2024-07-09 DIAGNOSIS — Z954 Presence of other heart-valve replacement: Secondary | ICD-10-CM

## 2024-07-09 NOTE — Patient Instructions (Signed)
 Medication Instructions:  Your physician recommends that you continue on your current medications as directed. Please refer to the Current Medication list given to you today.  *If you need a refill on your cardiac medications before your next appointment, please call your pharmacy*  Lab Work: NONE If you have labs (blood work) drawn today and your tests are completely normal, you will receive your results only by: MyChart Message (if you have MyChart) OR A paper copy in the mail If you have any lab test that is abnormal or we need to change your treatment, we will call you to review the results.  Testing/Procedures: NONE  Follow-Up: At [facility], you and your health needs are our priority.  As part of our continuing mission to provide you with exceptional heart care, our providers are all part of one team.  This team includes your primary Cardiologist (physician) and Advanced Practice Providers or APPs (Physician Assistants and Nurse Practitioners) who all work together to provide you with the care you need, when you need it.  Your next appointment:   2 year(s)  Provider:   DR. [facility]  Your physician recommends that you schedule a follow-up appointment WITH ELECTROPHYSIOLOGY NEXT AVAILABLE FOR DEVICE CHECK      We recommend signing up for the patient portal called MyChart.  Sign up information is provided on this After Visit Summary.  MyChart is used to connect with patients for Virtual Visits (Telemedicine).  Patients are able to view lab/test results, encounter notes, upcoming appointments, etc.  Non-urgent messages can be sent to your provider as well.   To learn more about what you can do with MyChart, go to [email or web address].

## 2024-07-09 NOTE — Progress Notes (Unsigned)
 Cardiology Office Note:  .   Date:  07/09/2024  ID:  Kayla Archer, DOB 1965-12-29, MRN [id number] PCP: Jacques Camie Franchot DEVONNA  [facility] Providers Cardiologist:  None { Click to update primary MD,subspecialty MD or APP then REFRESH:1}  History of Present Illness: Kayla   Rylen Archer is a 58 y.o. female patient with h/o bicuspid aortic valve and ascending aortic aneurysm, she also has had ascending aortic aneurysm repair which was very complex done in [facility] in 2004, the vascular structures and RV very close to the retrosternal space.  She underwent redo bioprosthetic valve replacement due to degenerative aortic stenosis along with closure of PFO on 04/24/2017. Postoperatively, developed complete heart block leading to placement of a MRI compatible Medtronic dual-chamber pacemaker on 04/30/2017.  She is presently on CT angiogram surveillance every other year as aortic repair has remained stable.  Last echocardiogram 06/13/2022: Normal LV systolic function, EF 66%.  Bioprosthetic trileaflet aortic valve with no evidence of stenosis with peak gradient of 14 mmHg and dimensionless index of 0.54.  Borderline prolapse of mitral valve with mild mitral regurgitation.  CT angiogram chest 06/09/2024: Stable aortic valve placement and aortic root repair and no change from 07/24/2022.  Discussed the use of AI scribe software for clinical note transcription with the patient, who gave verbal consent to proceed.  History of Present Illness   Labs   External Labs:  Care everywhere labs 10/08/2023:  Serum glucose 116 mg, BUN 15, creatinine 0.62, EGFR 103 mL.  Hb 13.3/HCT 40.6, platelets 225, normal indicis.  Lipid profile 12/07/2022: Total cholesterol 168, triglycerides 124, HDL 42, LDL 114.  ROS  Review of Systems  Cardiovascular:  Positive for near-syncope. Negative for chest pain, dyspnea on exertion and leg swelling.   Physical Exam:   VS:  BP (!) 150/80 (BP Location:  Left Arm, Patient Position: Sitting, Cuff Size: Normal)   Pulse 88   Resp 16   Ht 5' 3 (1.6 m)   Wt 134 lb (60.8 kg)   SpO2 99%   BMI 23.74 kg/m    Wt Readings from Last 3 Encounters:  07/09/24 134 lb (60.8 kg)  07/17/23 132 lb (59.9 kg)  06/21/23 132 lb (59.9 kg)       07/09/2024    2:21 PM 07/17/2023    9:42 AM 06/21/2023   11:35 AM  Vitals with BMI  Height 5' 3 5' 3 5' 3  Weight 134 lbs 132 lbs 132 lbs  BMI 23.74 23.39 23.39  Systolic 150 118 864  Diastolic 80 77 67  Pulse 88 76 72    Physical Exam Neck:     Vascular: No JVD.  Cardiovascular:     Rate and Rhythm: Normal rate and regular rhythm.     Pulses: Intact distal pulses.     Heart sounds: S1 normal and S2 normal. Murmur heard.     Early systolic murmur is present with a grade of 2/6 at the upper right sternal border.     No gallop.  Pulmonary:     Effort: Pulmonary effort is normal.     Breath sounds: Normal breath sounds.  Abdominal:     General: Bowel sounds are normal.     Palpations: Abdomen is soft.  Musculoskeletal:     Right lower leg: No edema.     Left lower leg: No edema.    Studies Reviewed: .    CT angiogram of the chest post thoracic aortic disease post TEVAR evaluation 06/09/2024:  Cardiovascular: Left subclavian transvenous pacemaker. SVC patent. Heart size normal. No pericardial effusion. Satisfactory opacification of pulmonary arteries noted, and there is no evidence of pulmonary emboli.  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 Descending thoracic aorta unremarkable. Lungs and mediastinum appear normal.   Stable CT angiogram of the chest for aortic aneurysm repair and aortic valve replacement and compared to 07/24/2022 no changes. EKG:         Medications ordered    No orders of the defined types were placed in this encounter.    ASSESSMENT AND PLAN: .      ICD-10-CM   1. Near syncope  R55      2. NSVT (nonsustained ventricular tachycardia) (HCC)  I47.29     3. H/O aortic valve replacement Carpentier-Edwards prosthetic aortic valve (size #23) 04/24/2017  Z95.4     4. H/O thoracic aortic aneurysm repair  Z98.890    Z86.79     5. Pre-operative cardiovascular examination R TKA with Jayson Crome at [facility] 09/29/24  Z01.810      Assessment & Plan   Signed,  Gordy Bergamo, MD, [facility] 07/09/2024, 10:46 PM [facility] [address] [place], [place] [address] Phone: [phone]. Fax:  [phone]

## 2024-07-20 ENCOUNTER — Ambulatory Visit: Attending: Cardiology | Admitting: Cardiology

## 2024-07-20 ENCOUNTER — Encounter: Payer: Self-pay | Admitting: Cardiology

## 2024-07-20 VITALS — BP 120/72 | HR 85 | Ht 63.0 in | Wt 134.0 lb

## 2024-07-20 DIAGNOSIS — I4729 Other ventricular tachycardia: Secondary | ICD-10-CM

## 2024-07-20 DIAGNOSIS — Z8679 Personal history of other diseases of the circulatory system: Secondary | ICD-10-CM

## 2024-07-20 DIAGNOSIS — Z950 Presence of cardiac pacemaker: Secondary | ICD-10-CM

## 2024-07-20 DIAGNOSIS — R55 Syncope and collapse: Secondary | ICD-10-CM | POA: Diagnosis not present

## 2024-07-20 DIAGNOSIS — I442 Atrioventricular block, complete: Secondary | ICD-10-CM

## 2024-07-20 DIAGNOSIS — Z9889 Other specified postprocedural states: Secondary | ICD-10-CM

## 2024-07-20 DIAGNOSIS — Z952 Presence of prosthetic heart valve: Secondary | ICD-10-CM

## 2024-07-20 LAB — CUP PACEART INCLINIC DEVICE CHECK
Date Time Interrogation Session: 20250811171742
Implantable Lead Connection Status: 753985
Implantable Lead Connection Status: 753985
Implantable Lead Implant Date: 20180522
Implantable Lead Implant Date: 20180522
Implantable Lead Location: 753859
Implantable Lead Location: 753860
Implantable Lead Model: 5076
Implantable Lead Model: 5076
Implantable Pulse Generator Implant Date: 20180522

## 2024-07-20 NOTE — Progress Notes (Addendum)
 Electrophysiology Office Note:   Date:  07/21/2024  ID:  Kayla Archer, DOB 05-May-1966, MRN [id number]  Primary Cardiologist: None Electrophysiologist: Fonda Kitty, MD      History of Present Illness:   Kayla Archer is a 58 y.o. female with h/o bicuspid aortic valve and ascending aortic aneurysm s/p initial surgery in 2004 then redo bioprosthetic AVR and PFO closure on 04/24/17 c/b postoperative CHB s/p dual chamber pacemaker 04/30/17 who is being seen today to establish care of her pacemaker.   Discussed the use of AI scribe software for clinical note transcription with the patient, who gave verbal consent to proceed.  History of Present Illness Kayla Archer is a 58 year old female with a history of pacemaker implantation and thoracic aortic aneurysm who presents with episodes of syncope and to establish care for her pacemaker.  She has experienced episodes of syncope and dizziness since January 2022, with the first episode occurring while pumping gas. The most recent episode was in July 2025, characterized by sudden onset of dizziness, nausea, and fatigue, often while sitting outside in warm weather. She describes a sensation akin to an 'aura' preceding these episodes, with symptoms including everything going dark and spinning, followed by nausea, headache, and a feeling of a 'bad hangover'. Recovery from these episodes takes a couple of hours, and she experiences 'brain fog' and sluggishness afterwards. No loss of bowel or bladder control during these episodes.  She has a history of complete heart block following her second valve replacement surgery. Her pacemaker is set to detect if her heart rate exceeds 154 beats per minute. She has experienced numbness in her left toes and is concerned about potential small fiber neuropathy or POTS.  Her past medical history includes two open-heart surgeries, both performed at the [facility].  She is scheduled for a total knee replacement on  September 29, 2024, due to bilateral knee issues that have limited her physical activity since June 2024.  She is otherwise doing relatively well.  She is active.  She denies any chest pain or shortness of breath.  No new or acute complaints today aside from already mentioned.   Review of systems complete and found to be negative unless listed in HPI.   EP Information / Studies Reviewed:        Echocardiogram 06/13/2022:  1. Normal LV systolic function with EF 66%. Left ventricle cavity is  normal in size. Normal left ventricular wall thickness. Normal global wall  motion. Normal diastolic filling pattern, normal LAP. Calculated EF 66%.  2. Right ventricle cavity is normal in size. Right ventricle pacemaker  lead wires visualized. Normal right ventricular function.  3. Bioprosthetic trileaflet aortic valve. No evidence of aortic stenosis.  Trace aortic regurgitation. Normal aortic valve leaflet mobility. Peak  velocity  1.68m/s, Peak Pressure Gradient  14 mmHg, Mean Gradient 7.15mmHg,  AVA 1.79 cm, Dimensionless Index 0.54  4. Borderline prolapse of the mitral valve leaflets. Mild (Grade I) mitral  regurgitation.  5. Structurally normal tricuspid valve.  Mild tricuspid regurgitation. No  evidence of pulmonary hypertension.  6. Structurally normal pulmonic valve.  Mild pulmonic regurgitation.  7. The aortic root is normal in size. Aortic root measured 2.8 cm and  acing aorta at 3.5 cm.  8.. No significant change since prior study 07/04/2017.    Physical Exam:   VS:  BP 120/72 (BP Location: Right Arm, Patient Position: Sitting, Cuff Size: Normal)   Pulse 85   Ht 5' 3 (1.6 m)  Wt 134 lb (60.8 kg)   SpO2 98%   BMI 23.74 kg/m    Wt Readings from Last 3 Encounters:  07/20/24 134 lb (60.8 kg)  07/09/24 134 lb (60.8 kg)  07/17/23 132 lb (59.9 kg)     GEN: Well nourished, well developed in no acute distress NECK: No JVD CARDIAC: Normal rate, regular rhythm.  Well-healed left  chest pacer pocket. RESPIRATORY:  Clear to auscultation without rales, wheezing or rhonchi  ABDOMEN: Soft, non-distended EXTREMITIES:  No edema; No deformity   ASSESSMENT AND PLAN:    Assessment & Plan Recurrent near syncope / presyncope: Description appears most consistent with vasovagal. States she has not had a complete loss of consciousness. - Device interrogated and there have been no arrhythmias associated with her syncopal events. - She is going to explore noncardiac causes for her symptoms. - Syncope precautions provided.  Avoid triggers. If she has true loss of consciousness, then she is not to drive for 6 months in accordance with state of [place] law.  Complete heart block with permanent pacemaker: -In clinic device interrogation was performed today.  Appropriate device function and stable lead parameters.  No programming changes made.  No arrhythmias correlating with dates of syncopal episodes. - Continue remote monitoring.  History of thoracic aortic aneurysm repair and bioprosthetic aortic valve replacement: Appears well compensated today.  Patient is concerned about potential need for third cardiac surgery based on shelf life of bioprosthetic aortic valve. -Continue follow-up with general cardiology, Dr. Ladona.  Surveillance echocardiogram has been ordered for next 6 months to assess aortic valve function.   Follow up with Dr. Kennyth in 6 months  Signed, Fonda Kennyth, MD

## 2024-07-20 NOTE — Patient Instructions (Signed)
 Medication Instructions:  Your physician recommends that you continue on your current medications as directed. Please refer to the Current Medication list given to you today.  *If you need a refill on your cardiac medications before your next appointment, please call your pharmacy*  Testing/Procedures: Echocardiogram  - in 6 months Your physician has requested that you have an echocardiogram. Echocardiography is a painless test that uses sound waves to create images of your heart. It provides your doctor with information about the size and shape of your heart and how well your heart's chambers and valves are working. This procedure takes approximately one hour. There are no restrictions for this procedure. Please do NOT wear cologne, perfume, aftershave, or lotions (deodorant is allowed). Please arrive 15 minutes prior to your appointment time.  Please note: We ask at that you not bring children with you during ultrasound (echo/ vascular) testing. Due to room size and safety concerns, children are not allowed in the ultrasound rooms during exams. Our front office staff cannot provide observation of children in our lobby area while testing is being conducted. An adult accompanying a patient to their appointment will only be allowed in the ultrasound room at the discretion of the ultrasound technician under special circumstances. We apologize for any inconvenience.  Follow-Up: At [facility], you and your health needs are our priority.  As part of our continuing mission to provide you with exceptional heart care, our providers are all part of one team.  This team includes your primary Cardiologist (physician) and Advanced Practice Providers or APPs (Physician Assistants and Nurse Practitioners) who all work together to provide you with the care you need, when you need it.  Your next appointment:   6 month  Provider:   Fonda Kitty, MD

## 2024-07-22 ENCOUNTER — Encounter: Payer: Self-pay | Admitting: Cardiology

## 2024-07-23 ENCOUNTER — Ambulatory Visit: Payer: Self-pay | Admitting: Cardiology

## 2024-08-12 ENCOUNTER — Ambulatory Visit (INDEPENDENT_AMBULATORY_CARE_PROVIDER_SITE_OTHER): Payer: BC Managed Care – PPO

## 2024-08-12 DIAGNOSIS — I442 Atrioventricular block, complete: Secondary | ICD-10-CM

## 2024-08-13 LAB — CUP PACEART REMOTE DEVICE CHECK
Battery Remaining Longevity: 59 mo
Battery Voltage: 2.96 V
Brady Statistic AP VP Percent: 37.82 %
Brady Statistic AP VS Percent: 0 %
Brady Statistic AS VP Percent: 62.16 %
Brady Statistic AS VS Percent: 0.02 %
Brady Statistic RA Percent Paced: 37.76 %
Brady Statistic RV Percent Paced: 99.98 %
Date Time Interrogation Session: 20250902214429
Implantable Lead Connection Status: 753985
Implantable Lead Connection Status: 753985
Implantable Lead Implant Date: 20180522
Implantable Lead Implant Date: 20180522
Implantable Lead Location: 753859
Implantable Lead Location: 753860
Implantable Lead Model: 5076
Implantable Lead Model: 5076
Implantable Pulse Generator Implant Date: 20180522
Lead Channel Impedance Value: 304 Ohm
Lead Channel Impedance Value: 361 Ohm
Lead Channel Impedance Value: 380 Ohm
Lead Channel Impedance Value: 456 Ohm
Lead Channel Pacing Threshold Amplitude: 0.375 V
Lead Channel Pacing Threshold Amplitude: 0.5 V
Lead Channel Pacing Threshold Pulse Width: 0.4 ms
Lead Channel Pacing Threshold Pulse Width: 0.4 ms
Lead Channel Sensing Intrinsic Amplitude: 14.625 mV
Lead Channel Sensing Intrinsic Amplitude: 14.625 mV
Lead Channel Sensing Intrinsic Amplitude: 2.125 mV
Lead Channel Sensing Intrinsic Amplitude: 2.125 mV
Lead Channel Setting Pacing Amplitude: 1.5 V
Lead Channel Setting Pacing Amplitude: 2 V
Lead Channel Setting Pacing Pulse Width: 0.4 ms
Lead Channel Setting Sensing Sensitivity: 0.9 mV
Zone Setting Status: 755011
Zone Setting Status: 755011

## 2024-08-16 ENCOUNTER — Ambulatory Visit: Payer: Self-pay | Admitting: Cardiology

## 2024-08-22 NOTE — Progress Notes (Signed)
 Remote PPM Transmission

## 2024-08-25 DIAGNOSIS — Z6823 Body mass index (BMI) 23.0-23.9, adult: Secondary | ICD-10-CM | POA: Diagnosis not present

## 2024-08-25 DIAGNOSIS — N760 Acute vaginitis: Secondary | ICD-10-CM | POA: Diagnosis not present

## 2024-08-25 DIAGNOSIS — Z01419 Encounter for gynecological examination (general) (routine) without abnormal findings: Secondary | ICD-10-CM | POA: Diagnosis not present

## 2024-09-06 ENCOUNTER — Encounter: Payer: Self-pay | Admitting: Cardiology

## 2024-09-08 DIAGNOSIS — Z01818 Encounter for other preprocedural examination: Secondary | ICD-10-CM | POA: Diagnosis not present

## 2024-09-08 DIAGNOSIS — Z9889 Other specified postprocedural states: Secondary | ICD-10-CM | POA: Diagnosis not present

## 2024-09-08 DIAGNOSIS — Z8679 Personal history of other diseases of the circulatory system: Secondary | ICD-10-CM | POA: Diagnosis not present

## 2024-09-08 DIAGNOSIS — Z86718 Personal history of other venous thrombosis and embolism: Secondary | ICD-10-CM | POA: Diagnosis not present

## 2024-09-08 DIAGNOSIS — T82857A Stenosis of cardiac prosthetic devices, implants and grafts, initial encounter: Secondary | ICD-10-CM | POA: Diagnosis not present

## 2024-09-08 DIAGNOSIS — Z953 Presence of xenogenic heart valve: Secondary | ICD-10-CM | POA: Diagnosis not present

## 2024-09-08 DIAGNOSIS — Q2112 Patent foramen ovale: Secondary | ICD-10-CM | POA: Diagnosis not present

## 2024-09-08 DIAGNOSIS — M170 Bilateral primary osteoarthritis of knee: Secondary | ICD-10-CM | POA: Diagnosis not present

## 2024-09-08 DIAGNOSIS — Y831 Surgical operation with implant of artificial internal device as the cause of abnormal reaction of the patient, or of later complication, without mention of misadventure at the time of the procedure: Secondary | ICD-10-CM | POA: Diagnosis not present

## 2024-09-08 DIAGNOSIS — R9431 Abnormal electrocardiogram [ECG] [EKG]: Secondary | ICD-10-CM | POA: Diagnosis not present

## 2024-09-29 DIAGNOSIS — Z953 Presence of xenogenic heart valve: Secondary | ICD-10-CM | POA: Diagnosis not present

## 2024-09-29 DIAGNOSIS — Z8774 Personal history of (corrected) congenital malformations of heart and circulatory system: Secondary | ICD-10-CM | POA: Diagnosis not present

## 2024-09-29 DIAGNOSIS — M25761 Osteophyte, right knee: Secondary | ICD-10-CM | POA: Diagnosis not present

## 2024-09-29 DIAGNOSIS — Z888 Allergy status to other drugs, medicaments and biological substances status: Secondary | ICD-10-CM | POA: Diagnosis not present

## 2024-09-29 DIAGNOSIS — M21061 Valgus deformity, not elsewhere classified, right knee: Secondary | ICD-10-CM | POA: Diagnosis not present

## 2024-09-29 DIAGNOSIS — Q2112 Patent foramen ovale: Secondary | ICD-10-CM | POA: Diagnosis not present

## 2024-09-29 DIAGNOSIS — Z7982 Long term (current) use of aspirin: Secondary | ICD-10-CM | POA: Diagnosis not present

## 2024-09-29 DIAGNOSIS — Z950 Presence of cardiac pacemaker: Secondary | ICD-10-CM | POA: Diagnosis not present

## 2024-09-29 DIAGNOSIS — Z79890 Hormone replacement therapy (postmenopausal): Secondary | ICD-10-CM | POA: Diagnosis not present

## 2024-09-29 DIAGNOSIS — Z79899 Other long term (current) drug therapy: Secondary | ICD-10-CM | POA: Diagnosis not present

## 2024-09-29 DIAGNOSIS — M1711 Unilateral primary osteoarthritis, right knee: Secondary | ICD-10-CM | POA: Diagnosis not present

## 2024-09-29 DIAGNOSIS — I442 Atrioventricular block, complete: Secondary | ICD-10-CM | POA: Diagnosis not present

## 2024-09-29 DIAGNOSIS — M170 Bilateral primary osteoarthritis of knee: Secondary | ICD-10-CM | POA: Diagnosis not present

## 2024-09-29 DIAGNOSIS — Z86718 Personal history of other venous thrombosis and embolism: Secondary | ICD-10-CM | POA: Diagnosis not present

## 2024-09-30 DIAGNOSIS — Z7982 Long term (current) use of aspirin: Secondary | ICD-10-CM | POA: Diagnosis not present

## 2024-09-30 DIAGNOSIS — Z953 Presence of xenogenic heart valve: Secondary | ICD-10-CM | POA: Diagnosis not present

## 2024-09-30 DIAGNOSIS — Z79899 Other long term (current) drug therapy: Secondary | ICD-10-CM | POA: Diagnosis not present

## 2024-09-30 DIAGNOSIS — Z79890 Hormone replacement therapy (postmenopausal): Secondary | ICD-10-CM | POA: Diagnosis not present

## 2024-09-30 DIAGNOSIS — Z950 Presence of cardiac pacemaker: Secondary | ICD-10-CM | POA: Diagnosis not present

## 2024-09-30 DIAGNOSIS — M170 Bilateral primary osteoarthritis of knee: Secondary | ICD-10-CM | POA: Diagnosis not present

## 2024-09-30 DIAGNOSIS — Z8774 Personal history of (corrected) congenital malformations of heart and circulatory system: Secondary | ICD-10-CM | POA: Diagnosis not present

## 2024-09-30 DIAGNOSIS — Z86718 Personal history of other venous thrombosis and embolism: Secondary | ICD-10-CM | POA: Diagnosis not present

## 2024-09-30 DIAGNOSIS — Z888 Allergy status to other drugs, medicaments and biological substances status: Secondary | ICD-10-CM | POA: Diagnosis not present

## 2024-09-30 DIAGNOSIS — M1711 Unilateral primary osteoarthritis, right knee: Secondary | ICD-10-CM | POA: Diagnosis not present

## 2024-09-30 DIAGNOSIS — M25761 Osteophyte, right knee: Secondary | ICD-10-CM | POA: Diagnosis not present

## 2024-09-30 DIAGNOSIS — I442 Atrioventricular block, complete: Secondary | ICD-10-CM | POA: Diagnosis not present

## 2024-10-05 DIAGNOSIS — Z96651 Presence of right artificial knee joint: Secondary | ICD-10-CM | POA: Diagnosis not present

## 2024-10-05 DIAGNOSIS — Z0389 Encounter for observation for other suspected diseases and conditions ruled out: Secondary | ICD-10-CM | POA: Diagnosis not present

## 2024-10-11 NOTE — Therapy (Unsigned)
 OUTPATIENT PHYSICAL THERAPY LOWER EXTREMITY EVALUATION   Patient Name: Kayla Archer MRN: [id number] DOB:[birth year], 58 y.o., female Today's Date: 10/12/2024  END OF SESSION:  PT End of Session - 10/12/24 1001     Visit Number 1    Number of Visits 16    Date for Recertification  12/12/24    Authorization Type BCBS    PT Start Time 1000    PT Stop Time 1045    PT Time Calculation (min) 45 min    Activity Tolerance Patient tolerated treatment well;Patient limited by pain    Behavior During Therapy [facility] for tasks assessed/performed          Past Medical History:  Diagnosis Date   Cartilage tear    rt knee   CHB (complete heart block) (HCC) 04/24/2017   Overview:  History: Post-op  Assessment: CHB with escape rate 50-60's. S/p PPM placement 05.22.2018.   PMW intact. On low-dose BB.   Plan: Continue low dose BB at discharge. Follow EP recommendations at discharge.   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 04/30/2017 06/24/2017   PONV (postoperative nausea and vomiting)    plus hypotension   Past Surgical History:  Procedure Laterality Date   CARDIAC VALVE REPLACEMENT  04   thoracic aortic aneurysm plus valve   INSERT / REPLACE / REMOVE PACEMAKER     at [facility] 04/2017 for complete heart block   KNEE ARTHROSCOPY Right 12/28/2013   Procedure: ARTHROSCOPY KNEE;  Surgeon: Marcey Raman, MD;  Location: [facility] OR;  Service: Orthopedics;  Laterality: Right;   LABIOPLASTY Bilateral 10/04/2016   Procedure: LABIAL REDUCTION;  Surgeon: Rosaline Cobble, MD;  Location: [facility] ORS;  Service: Gynecology;  Laterality: Bilateral;   TEE WITHOUT CARDIOVERSION N/A 02/08/2017   Procedure: TRANSESOPHAGEAL ECHOCARDIOGRAM (TEE);  Surgeon: Jerel Balding, MD;  Location: [facility] ENDOSCOPY;  Service: Cardiovascular;  Laterality: N/A;   VALVE REPLACEMENT  04/2017 [facility]    Patient Active Problem List   Diagnosis Date Noted   Acute medial meniscus tear of right knee 06/20/2023   Other tear of medial meniscus, current injury, left knee, subsequent encounter 06/20/2023   Heart disease 05/09/2021   Encounter for care of pacemaker 09/27/2019   Piriformis syndrome of left side 06/16/2019   H/O thoracic aortic aneurysm repair 02/14/2019   Other cytomegaloviral diseases (HCC) 02/14/2019   Vitamin D deficiency 02/14/2019   Degenerative arthritis of right knee 05/14/2018   Nontraumatic neck pain 10/25/2017   Nonallopathic lesion of cervical region 10/25/2017   Nonallopathic lesion of rib cage 10/25/2017   Hx of Lyme disease 07/15/2017   Pacemaker Medtronic 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 04/30/2017 06/24/2017   Atrial flutter (HCC) 05/02/2017   Transition of care performed with sharing of clinical summary 04/26/2017   CHB (complete heart block) (HCC) 04/24/2017   PFO (patent foramen ovale) 03/13/2017   Prosthetic aortic valve stenosis    Nonallopathic lesion of lumbosacral region 11/20/2016   Nonallopathic lesion of sacral region 11/20/2016   Nonallopathic lesion of thoracic region 11/20/2016   Left lumbar radiculopathy 11/13/2016   Left knee injury 03/29/2016   S/P arthroscopy of knee 11/02/2014   Mid sternal chest pain 03/31/2014   Aortic regurgitation 03/31/2014   S/P aortic valve replacement with bioprosthetic valve 03/31/2014   Knee joint pain 10/20/2013   Vitiligo 08/09/2003  PCP: Jacques Camie Pepper, PA-C   REFERRING PROVIDER: Viann Jayson Raddle., MD  REFERRING DIAG: sTATUSW POST RIGHT KNEE  THERAPY DIAG:  History of arthroplasty of right knee  Acute pain of right knee  Other abnormalities of gait and mobility  Muscle weakness (generalized)  Rationale for Evaluation and Treatment: Rehabilitation  ONSET DATE: 09/29/24 DOS  SUBJECTIVE:   SUBJECTIVE STATEMENT: Presents to OPPT following R TKA following long standing history  of R knee pain and dysfunction and unsuccessful conservative measures.  Patient focused on pain level with reluctance to rely on pain meds for fear of reliance on them.    PERTINENT HISTORY: None available PAIN:  Are you having pain? Yes: NPRS scale: 8/10 Pain location: R knee Pain description: ache, throb, sharp Aggravating factors: movement, activity Relieving factors: rest, meds  PRECAUTIONS: None for knee, pacemaker  RED FLAGS: None   WEIGHT BEARING RESTRICTIONS: No  FALLS:  Has patient fallen in last 6 months? No  LIVING ENVIRONMENT: Lives with: lives with their family Lives in: House/apartment Stairs: 2 Has following equipment at home: Environmental Consultant - 2 wheeled  OCCUPATION: not working  PLOF: Independent  PATIENT GOALS: To regain my knee function  NEXT MD VISIT: 10/13/24  OBJECTIVE:  Note: Objective measures were completed at Evaluation unless otherwise noted.  DIAGNOSTIC FINDINGS: none  PATIENT SURVEYS:  LEFS    MUSCLE LENGTH: N/T  POSTURE: flexed R knee  PALPATION: Good patellar mobility, no redness/warmth and minimal swelling/edema  LOWER EXTREMITY ROM:  A/PROM Right eval Left eval  Hip flexion    Hip extension    Hip abduction    Hip adduction    Hip internal rotation    Hip external rotation    Knee flexion 55/60d   Knee extension -16/-6d   Ankle dorsiflexion    Ankle plantarflexion    Ankle inversion    Ankle eversion     (Blank rows = not tested)  LOWER EXTREMITY MMT:  MMT Right eval Left eval  Hip flexion    Hip extension    Hip abduction    Hip adduction    Hip internal rotation    Hip external rotation    Knee flexion 3/5   Knee extension 3/5   Ankle dorsiflexion    Ankle plantarflexion    Ankle inversion    Ankle eversion     (Blank rows = not tested)  LOWER EXTREMITY SPECIAL TESTS:  Deferred post-op  FUNCTIONAL TESTS:  30 seconds chair stand test 1 with UE assist  GAIT: Distance walked: 69ftx2 Assistive device  utilized: Walker - 2 wheeled Level of assistance: Modified independence Comments: slow cadence, step through pattern                                                                                                                                TREATMENT:  [facility] Adult PT Treatment:  DATE: 10/12/24 Eval and HEP  Self Care: Additional minutes spent for educating on updated Therapeutic Home Exercise Program as well as comparing current status to condition at start of symptoms. This included exercises focusing on stretching, strengthening, with focus on eccentric aspects. Long term goals include an improvement in range of motion, strength, endurance as well as avoiding reinjury. Patient's frequency would include in 1-2 times a day, 3-5 times a week for a duration of 6-12 weeks. Proper technique shown and discussed handout in great detail. All questions were discussed and addressed.     PATIENT EDUCATION:  Education details: Discussed eval findings, rehab rationale and POC and patient is in agreement  Person educated: Patient and Spouse Education method: Explanation and Handouts Education comprehension: verbalized understanding and needs further education  HOME EXERCISE PROGRAM: Access Code: [email or web address] URL: [email or web address] Date: 10/12/2024 Prepared by: Reyes Kohut  Exercises - Supine Quad Set  - 5 x daily - 5 x weekly - 5 sets - 100 reps - Supine Heel Slide with Strap  - 5 x daily - 5 x weekly - 5 sets - 100 reps - Seated Knee Flexion Slide  - 5 x daily - 5 x weekly - 5 sets - 100 reps  ASSESSMENT:  CLINICAL IMPRESSION: Patient is a 58 y.o. female who was seen today for physical therapy evaluation and treatment following R TKA.   Patient is approximately 2 weeks postop with Aquacel dressing in place without signs of redness drainage or excessive warmth noted at knee and around dressing edges.  Patient ambulates with a rolling  walker using a step through gait with slow cadence and decreased step length bilaterally.  She is able to transfer out of a chair independently but requires upper extremity assist to place right lower extremity onto table.  Patient is extremely fearful of knee flexion due to pain levels.  Patient is able to demonstrate good quad control and facilitation with extension range of motion improving throughout session.  Knee flexion remain patient's main limitation as she was apprehensive about pain.  Patient is a good candidate for outpatient rehab to regain right knee strength and function.  She has a 2-week postop follow-up with her surgeons office and has been recommended to discuss optimum pain relieving techniques to allow more comfortable participation in rehab.  OBJECTIVE IMPAIRMENTS: Abnormal gait, decreased activity tolerance, decreased balance, decreased coordination, decreased endurance, decreased knowledge of condition, decreased mobility, difficulty walking, decreased ROM, decreased strength, increased fascial restrictions, impaired perceived functional ability, improper body mechanics, and pain.   ACTIVITY LIMITATIONS: carrying, lifting, sitting, standing, squatting, stairs, transfers, bed mobility, toileting, and locomotion level  PERSONAL FACTORS: Behavior pattern, Fitness, and Past/current experiences are also affecting patient's functional outcome.   REHAB POTENTIAL: Good  CLINICAL DECISION MAKING: Stable/uncomplicated  EVALUATION COMPLEXITY: Low   GOALS: Goals reviewed with patient? No  SHORT TERM GOALS: Target date: 11/09/2024   Patient to demonstrate independence in HEP  Baseline: [email or web address] Goal status: INITIAL  2.  215ft ambulation with LRAD and appropriate gait pattern Baseline: 65ft with RW and step through pattern Goal status: INITIAL  3.  90d AROM knee flexion Baseline: 55d Goal status: INITIAL   LONG TERM GOALS: Target date: 12/07/2024    Patient will  acknowledge 4/10 pain at least once during episode of care   Baseline: 8/10 Goal status: INITIAL  2.  Increase AROM of R knee to 115d flexion -5d extension Baseline:  A/PROM Right eval Left eval  Hip flexion    Hip  extension    Hip abduction    Hip adduction    Hip internal rotation    Hip external rotation    Knee flexion 55/60d   Knee extension -16/-6d    Goal status: INITIAL  3.  Increase R knee strength to 4/5 Baseline:  MMT Right eval Left eval  Hip flexion    Hip extension    Hip abduction    Hip adduction    Hip internal rotation    Hip external rotation    Knee flexion 3/5   Knee extension 3/5    Goal status: INITIAL  4.  Patient to negotiate 16 stairs with most appropriate pattern and assist. Baseline: TBD Goal status: INITIAL  5.  Patient will score at least 60/80 on LEFS to signify clinically meaningful improvement in functional abilities.   Baseline: 10/80 Goal status: INITIAL     PLAN:  PT FREQUENCY: 1-2x/week  PT DURATION: 8 weeks  PLANNED INTERVENTIONS: 97110-Therapeutic exercises, 97530- Therapeutic activity, 97112- Neuromuscular re-education, 97535- Self Care, [id number]- Manual therapy, [phone]- Gait training, Patient/Family education, Balance training, and Stair training  PLAN FOR NEXT SESSION: HEP review and update, manual techniques as appropriate, aerobic tasks, ROM and flexibility activities, strengthening and PREs, TPDN, gait and balance training,aquatic therapy, modalities for pain and NMRE      Reyes CHRISTELLA Kohut, PT 10/12/2024, 10:07 AM

## 2024-10-12 ENCOUNTER — Ambulatory Visit: Attending: Physician Assistant

## 2024-10-12 ENCOUNTER — Encounter: Payer: Self-pay | Admitting: Radiology

## 2024-10-12 ENCOUNTER — Other Ambulatory Visit: Payer: Self-pay

## 2024-10-12 DIAGNOSIS — M6281 Muscle weakness (generalized): Secondary | ICD-10-CM | POA: Diagnosis not present

## 2024-10-12 DIAGNOSIS — M25561 Pain in right knee: Secondary | ICD-10-CM | POA: Diagnosis not present

## 2024-10-12 DIAGNOSIS — R2689 Other abnormalities of gait and mobility: Secondary | ICD-10-CM | POA: Diagnosis not present

## 2024-10-12 DIAGNOSIS — Z96651 Presence of right artificial knee joint: Secondary | ICD-10-CM | POA: Insufficient documentation

## 2024-10-13 DIAGNOSIS — Z471 Aftercare following joint replacement surgery: Secondary | ICD-10-CM | POA: Diagnosis not present

## 2024-10-13 DIAGNOSIS — M25461 Effusion, right knee: Secondary | ICD-10-CM | POA: Diagnosis not present

## 2024-10-13 DIAGNOSIS — Z96651 Presence of right artificial knee joint: Secondary | ICD-10-CM | POA: Diagnosis not present

## 2024-10-15 ENCOUNTER — Ambulatory Visit: Admitting: Physical Therapy

## 2024-10-16 DIAGNOSIS — N390 Urinary tract infection, site not specified: Secondary | ICD-10-CM | POA: Diagnosis not present

## 2024-10-19 ENCOUNTER — Ambulatory Visit: Admitting: Physical Therapy

## 2024-10-20 NOTE — Therapy (Signed)
 OUTPATIENT PHYSICAL THERAPY LOWER EXTREMITY EVALUATION   Patient Name: Kayla Archer MRN: [id number] DOB:24-Feb-1966, 58 y.o., female Today's Date: 10/21/2024  END OF SESSION:  PT End of Session - 10/21/24 1250     Visit Number 2    Number of Visits 16    Date for Recertification  12/12/24    Authorization Type [employer]    PT Start Time 1015    PT Stop Time 1110    PT Time Calculation (min) 55 min    Activity Tolerance Patient tolerated treatment well;Patient limited by pain           Past Medical History:  Diagnosis Date   Cartilage tear    rt knee   CHB (complete heart block) (HCC) 04/24/2017   Overview:  History: Post-op  Assessment: CHB with escape rate 50-60's. S/p PPM placement 05.22.2018.   PMW intact. On low-dose BB.   Plan: Continue low dose BB at discharge. Follow EP recommendations at discharge.   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 04/30/2017 06/24/2017   PONV (postoperative nausea and vomiting)    plus hypotension   Past Surgical History:  Procedure Laterality Date   CARDIAC VALVE REPLACEMENT  04   thoracic aortic aneurysm plus valve   INSERT / REPLACE / REMOVE PACEMAKER     at [facility] 04/2017 for complete heart block   KNEE ARTHROSCOPY Right 12/28/2013   Procedure: ARTHROSCOPY KNEE;  Surgeon: Marcey Raman, MD;  Location: [facility] OR;  Service: Orthopedics;  Laterality: Right;   LABIOPLASTY Bilateral 10/04/2016   Procedure: LABIAL REDUCTION;  Surgeon: Rosaline Cobble, MD;  Location: [facility] ORS;  Service: Gynecology;  Laterality: Bilateral;   TEE WITHOUT CARDIOVERSION N/A 02/08/2017   Procedure: TRANSESOPHAGEAL ECHOCARDIOGRAM (TEE);  Surgeon: Jerel Balding, MD;  Location: [facility] ENDOSCOPY;  Service: Cardiovascular;  Laterality: N/A;   VALVE REPLACEMENT  04/2017 [facility]   Patient Active Problem List   Diagnosis Date Noted    Acute medial meniscus tear of right knee 06/20/2023   Other tear of medial meniscus, current injury, left knee, subsequent encounter 06/20/2023   Heart disease 05/09/2021   Encounter for care of pacemaker 09/27/2019   Piriformis syndrome of left side 06/16/2019   H/O thoracic aortic aneurysm repair 02/14/2019   Other cytomegaloviral diseases (HCC) 02/14/2019   Vitamin D deficiency 02/14/2019   Degenerative arthritis of right knee 05/14/2018   Nontraumatic neck pain 10/25/2017   Nonallopathic lesion of cervical region 10/25/2017   Nonallopathic lesion of rib cage 10/25/2017   Hx of Lyme disease 07/15/2017   Pacemaker Medtronic 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 04/30/2017 06/24/2017   Atrial flutter (HCC) 05/02/2017   Transition of care performed with sharing of clinical summary 04/26/2017   CHB (complete heart block) (HCC) 04/24/2017   PFO (patent foramen ovale) 03/13/2017   Prosthetic aortic valve stenosis    Nonallopathic lesion of lumbosacral region 11/20/2016   Nonallopathic lesion of sacral region 11/20/2016   Nonallopathic lesion of thoracic region 11/20/2016   Left lumbar radiculopathy 11/13/2016   Left knee injury 03/29/2016   S/P arthroscopy of knee 11/02/2014   Mid sternal chest pain 03/31/2014   Aortic regurgitation 03/31/2014   S/P aortic valve replacement with bioprosthetic valve 03/31/2014   Knee joint pain 10/20/2013   Vitiligo 08/09/2003    PCP: Jacques Camie Pepper, PA-C  REFERRING PROVIDER: Viann Jayson Raddle., MD  REFERRING DIAG: sTATUSW POST RIGHT KNEE  THERAPY DIAG:  History of arthroplasty of right knee  Acute pain of right knee  Other abnormalities of gait and mobility  Muscle weakness (generalized)  Rationale for Evaluation and Treatment: Rehabilitation  ONSET DATE: 09/29/24 DOS  SUBJECTIVE:   SUBJECTIVE STATEMENT: Pt reports seeing the surgeon and was placed on a prednisone  taper and hold of PT for 10 days. She states she is currently  taking oxycontin  prior to her HEP as needed and prior to this PT session.  EVAL: Presents to OPPT following R TKA following long standing history of R knee pain and dysfunction and unsuccessful conservative measures.  Patient focused on pain level with reluctance to rely on pain meds for fear of reliance on them.    PERTINENT HISTORY: None available PAIN:  Are you having pain? Yes: NPRS scale: 8/10 Pain location: R knee Pain description: ache, throb, sharp Aggravating factors: movement, activity Relieving factors: rest, meds  PRECAUTIONS: None for knee, pacemaker  RED FLAGS: None   WEIGHT BEARING RESTRICTIONS: No  FALLS:  Has patient fallen in last 6 months? No  LIVING ENVIRONMENT: Lives with: lives with their family Lives in: House/apartment Stairs: 2 Has following equipment at home: Environmental Consultant - 2 wheeled  OCCUPATION: not working  PLOF: Independent  PATIENT GOALS: To regain my knee function  NEXT MD VISIT: 10/13/24  OBJECTIVE:  Note: Objective measures were completed at Evaluation unless otherwise noted.  DIAGNOSTIC FINDINGS: none  PATIENT SURVEYS:  LEFS    MUSCLE LENGTH: N/T  POSTURE: flexed R knee  10/21/24 PALPATION: Good patellar mobility, no redness/warmth and minimal swelling/edema  LOWER EXTREMITY ROM:  A/PROM Right eval Left eval RT 10/21/24  Hip flexion     Hip extension     Hip abduction     Hip adduction     Hip internal rotation     Hip external rotation     Knee flexion 55/60d  AAROM 80d  Knee extension -16/-6d  AROM 10d lacking  Ankle dorsiflexion     Ankle plantarflexion     Ankle inversion     Ankle eversion      (Blank rows = not tested)  LOWER EXTREMITY MMT:  MMT Right eval Left eval  Hip flexion    Hip extension    Hip abduction    Hip adduction    Hip internal rotation    Hip external rotation    Knee flexion 3/5   Knee extension 3/5   Ankle dorsiflexion    Ankle plantarflexion    Ankle inversion    Ankle  eversion     (Blank rows = not tested)  LOWER EXTREMITY SPECIAL TESTS:  Deferred post-op  FUNCTIONAL TESTS:  30 seconds chair stand test 1 with UE assist  GAIT: Distance walked: 12ftx2 Assistive device utilized: Walker - 2 wheeled Level of assistance: Modified independence Comments: slow cadence, step through pattern  TREATMENT:  [facility] Adult PT Treatment:                                                DATE: 10/21/24 Therapeutic Exercise: Seated Knee Flexion Slide x10 Seated knee flexion c pt overpressure opp leg x5 20 Assisted Supine heel slides x10  c strap Supine heel slides x2 20 over pressure c strap Supine Quad Set x10 5 Supine AA SLR c strap progressing to Ind c min quad lag x10 Supine SAQ x5 3 Modalities: CP c elevation to the R knee at 10 mins  [facility] Adult PT Treatment:                                                DATE: 10/12/24 Eval and HEP  Self Care: Additional minutes spent for educating on updated Therapeutic Home Exercise Program as well as comparing current status to condition at start of symptoms. This included exercises focusing on stretching, strengthening, with focus on eccentric aspects. Long term goals include an improvement in range of motion, strength, endurance as well as avoiding reinjury. Patient's frequency would include in 1-2 times a day, 3-5 times a week for a duration of 6-12 weeks. Proper technique shown and discussed handout in great detail. All questions were discussed and addressed.     PATIENT EDUCATION:  Education details: Discussed eval findings, rehab rationale and POC and patient is in agreement  Person educated: Patient and Spouse Education method: Explanation and Handouts Education comprehension: verbalized understanding and needs further education  HOME EXERCISE PROGRAM: Access Code: [email or web address] URL:  [email or web address] Date: 10/21/2024 Prepared by: Dasie Daft  Exercises - Supine Quad Set  - 5 x daily - 5 x weekly - 1 sets - 10-15 reps - Active Straight Leg Raise with Quad Set  - 5 x daily - 7 x weekly - 1 sets - 10 reps - 3 hold - Supine Knee Extension Strengthening  - 5 x daily - 7 x weekly - 1 sets - 10 reps - 3 hold - Supine Heel Slide with Strap  - 5 x daily - 5 x weekly - 1 sets - 10-15 reps - Seated Knee Flexion Slide  - 5 x daily - 5 x weekly - 1 sets - 10-15 reps  ASSESSMENT:  CLINICAL IMPRESSION:  PT was completed for ROM and strengthening. AA knee flexion improved to 80d. Pt progressed from needing assistance for a SLR to complete Indly c a min quad lag. Pt is making appropriate progress post R TKA. Pt tolerated prescribed exs in PT today without adverse effects. HEP was updated. Pt returned proper demonstration. A CP with elevation was provided at EOS for symptom management.  Patient is a 58 y.o. female who was seen today for physical therapy evaluation and treatment following R TKA.   Patient is approximately 2 weeks postop with Aquacel dressing in place without signs of redness drainage or excessive warmth noted at knee and around dressing edges.  Patient ambulates with a rolling walker using a step through gait with slow cadence and decreased step length bilaterally.  She is able to transfer out of a chair independently but requires upper extremity assist to place right lower extremity onto table.  Patient is  extremely fearful of knee flexion due to pain levels.  Patient is able to demonstrate good quad control and facilitation with extension range of motion improving throughout session.  Knee flexion remain patient's main limitation as she was apprehensive about pain.  Patient is a good candidate for outpatient rehab to regain right knee strength and function.  She has a 2-week postop follow-up with her surgeons office and has been recommended to discuss optimum  pain relieving techniques to allow more comfortable participation in rehab.  OBJECTIVE IMPAIRMENTS: Abnormal gait, decreased activity tolerance, decreased balance, decreased coordination, decreased endurance, decreased knowledge of condition, decreased mobility, difficulty walking, decreased ROM, decreased strength, increased fascial restrictions, impaired perceived functional ability, improper body mechanics, and pain.   ACTIVITY LIMITATIONS: carrying, lifting, sitting, standing, squatting, stairs, transfers, bed mobility, toileting, and locomotion level  PERSONAL FACTORS: Behavior pattern, Fitness, and Past/current experiences are also affecting patient's functional outcome.   REHAB POTENTIAL: Good  CLINICAL DECISION MAKING: Stable/uncomplicated  EVALUATION COMPLEXITY: Low   GOALS: Goals reviewed with patient? No  SHORT TERM GOALS: Target date: 11/09/2024   Patient to demonstrate independence in HEP  Baseline: [email or web address] Goal status: ONGOING  2.  273ft ambulation with LRAD and appropriate gait pattern Baseline: 66ft with RW and step through pattern Goal status: INITIAL  3.  90d AROM knee flexion Baseline: 55d Goal status: INITIAL   LONG TERM GOALS: Target date: 12/07/2024    Patient will acknowledge 4/10 pain at least once during episode of care   Baseline: 8/10 Goal status: INITIAL  2.  Increase AROM of R knee to 115d flexion -5d extension Baseline:  A/PROM Right eval Left eval  Hip flexion    Hip extension    Hip abduction    Hip adduction    Hip internal rotation    Hip external rotation    Knee flexion 55/60d   Knee extension -16/-6d    Goal status: INITIAL  3.  Increase R knee strength to 4/5 Baseline:  MMT Right eval Left eval  Hip flexion    Hip extension    Hip abduction    Hip adduction    Hip internal rotation    Hip external rotation    Knee flexion 3/5   Knee extension 3/5    Goal status: INITIAL  4.  Patient to negotiate 16 stairs  with most appropriate pattern and assist. Baseline: TBD Goal status: INITIAL  5.  Patient will score at least 60/80 on LEFS to signify clinically meaningful improvement in functional abilities.   Baseline: 10/80 Goal status: INITIAL     PLAN:  PT FREQUENCY: 1-2x/week  PT DURATION: 8 weeks  PLANNED INTERVENTIONS: 97110-Therapeutic exercises, 97530- Therapeutic activity, 97112- Neuromuscular re-education, 97535- Self Care, [id number]- Manual therapy, [phone]- Gait training, Patient/Family education, Balance training, and Stair training  PLAN FOR NEXT SESSION: HEP review and update, manual techniques as appropriate, aerobic tasks, ROM and flexibility activities, strengthening and PREs, TPDN, gait and balance training,aquatic therapy, modalities for pain and NMRE      Swayze Kozuch, PT 10/21/2024, 1:16 PM

## 2024-10-21 ENCOUNTER — Ambulatory Visit

## 2024-10-21 DIAGNOSIS — M25561 Pain in right knee: Secondary | ICD-10-CM

## 2024-10-21 DIAGNOSIS — Z96651 Presence of right artificial knee joint: Secondary | ICD-10-CM | POA: Diagnosis not present

## 2024-10-21 DIAGNOSIS — M6281 Muscle weakness (generalized): Secondary | ICD-10-CM | POA: Diagnosis not present

## 2024-10-21 DIAGNOSIS — R2689 Other abnormalities of gait and mobility: Secondary | ICD-10-CM

## 2024-10-26 ENCOUNTER — Ambulatory Visit: Admitting: Physical Therapy

## 2024-10-26 ENCOUNTER — Encounter: Payer: Self-pay | Admitting: Physical Therapy

## 2024-10-26 DIAGNOSIS — R2689 Other abnormalities of gait and mobility: Secondary | ICD-10-CM | POA: Diagnosis not present

## 2024-10-26 DIAGNOSIS — M25561 Pain in right knee: Secondary | ICD-10-CM

## 2024-10-26 DIAGNOSIS — M6281 Muscle weakness (generalized): Secondary | ICD-10-CM | POA: Diagnosis not present

## 2024-10-26 DIAGNOSIS — Z96651 Presence of right artificial knee joint: Secondary | ICD-10-CM | POA: Diagnosis not present

## 2024-10-26 NOTE — Therapy (Signed)
 OUTPATIENT PHYSICAL THERAPY LOWER EXTREMITY EVALUATION   Patient Name: Kayla Archer MRN: [id number] DOB:March 15, 1966, 58 y.o., female Today's Date: 10/26/2024  END OF SESSION:  PT End of Session - 10/26/24 1100     Visit Number 3    Number of Visits 16    Date for Recertification  12/12/24    PT Start Time 1100    PT Stop Time 1208    PT Time Calculation (min) 68 min    Activity Tolerance Patient tolerated treatment well    Behavior During Therapy [facility] for tasks assessed/performed            Past Medical History:  Diagnosis Date   Cartilage tear    rt knee   CHB (complete heart block) (HCC) 04/24/2017   Overview:  History: Post-op  Assessment: CHB with escape rate 50-60's. S/p PPM placement 05.22.2018.   PMW intact. On low-dose BB.   Plan: Continue low dose BB at discharge. Follow EP recommendations at discharge.   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 04/30/2017 06/24/2017   PONV (postoperative nausea and vomiting)    plus hypotension   Past Surgical History:  Procedure Laterality Date   CARDIAC VALVE REPLACEMENT  04   thoracic aortic aneurysm plus valve   INSERT / REPLACE / REMOVE PACEMAKER     at [facility] 04/2017 for complete heart block   KNEE ARTHROSCOPY Right 12/28/2013   Procedure: ARTHROSCOPY KNEE;  Surgeon: Marcey Raman, MD;  Location: [facility] OR;  Service: Orthopedics;  Laterality: Right;   LABIOPLASTY Bilateral 10/04/2016   Procedure: LABIAL REDUCTION;  Surgeon: Rosaline Cobble, MD;  Location: [facility] ORS;  Service: Gynecology;  Laterality: Bilateral;   TEE WITHOUT CARDIOVERSION N/A 02/08/2017   Procedure: TRANSESOPHAGEAL ECHOCARDIOGRAM (TEE);  Surgeon: Jerel Balding, MD;  Location: [facility] ENDOSCOPY;  Service: Cardiovascular;  Laterality: N/A;   VALVE REPLACEMENT  04/2017 [facility]   Patient Active Problem List   Diagnosis Date  Noted   Acute medial meniscus tear of right knee 06/20/2023   Other tear of medial meniscus, current injury, left knee, subsequent encounter 06/20/2023   Heart disease 05/09/2021   Encounter for care of pacemaker 09/27/2019   Piriformis syndrome of left side 06/16/2019   H/O thoracic aortic aneurysm repair 02/14/2019   Other cytomegaloviral diseases (HCC) 02/14/2019   Vitamin D deficiency 02/14/2019   Degenerative arthritis of right knee 05/14/2018   Nontraumatic neck pain 10/25/2017   Nonallopathic lesion of cervical region 10/25/2017   Nonallopathic lesion of rib cage 10/25/2017   Hx of Lyme disease 07/15/2017   Pacemaker Medtronic 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 04/30/2017 06/24/2017   Atrial flutter (HCC) 05/02/2017   Transition of care performed with sharing of clinical summary 04/26/2017   CHB (complete heart block) (HCC) 04/24/2017   PFO (patent foramen ovale) 03/13/2017   Prosthetic aortic valve stenosis    Nonallopathic lesion of lumbosacral region 11/20/2016   Nonallopathic lesion of sacral region 11/20/2016   Nonallopathic lesion of thoracic region 11/20/2016   Left lumbar radiculopathy 11/13/2016   Left knee injury 03/29/2016   S/P arthroscopy of knee 11/02/2014   Mid sternal chest pain 03/31/2014   Aortic regurgitation 03/31/2014   S/P aortic valve replacement with bioprosthetic valve 03/31/2014   Knee joint pain 10/20/2013   Vitiligo 08/09/2003    PCP: Jacques Camie Pepper,  PA-C   REFERRING PROVIDER: Viann Jayson Raddle., MD  REFERRING DIAG: sTATUSW POST RIGHT KNEE  THERAPY DIAG:  Acute pain of right knee  Other abnormalities of gait and mobility  Muscle weakness (generalized)  Rationale for Evaluation and Treatment: Rehabilitation  ONSET DATE: 09/29/24 DOS  SUBJECTIVE:   SUBJECTIVE STATEMENT: 10/26/2024 I woke up this morning, and that I as able to walk better. The pain is getting better.   EVAL: Presents to OPPT following R TKA following  long standing history of R knee pain and dysfunction and unsuccessful conservative measures.  Patient focused on pain level with reluctance to rely on pain meds for fear of reliance on them.    PERTINENT HISTORY: None available PAIN:  Are you having pain? Yes: NPRS scale: took 1/2 pain med prior to session, 2-3/10 Pain location: R knee Pain description: ache, throb, sharp Aggravating factors: movement, activity Relieving factors: rest, meds  PRECAUTIONS: None for knee, pacemaker  RED FLAGS: None   WEIGHT BEARING RESTRICTIONS: No  FALLS:  Has patient fallen in last 6 months? No  LIVING ENVIRONMENT: Lives with: lives with their family Lives in: House/apartment Stairs: 2 Has following equipment at home: Environmental Consultant - 2 wheeled  OCCUPATION: not working  PLOF: Independent  PATIENT GOALS: To regain my knee function  NEXT MD VISIT: 10/13/24  OBJECTIVE:  Note: Objective measures were completed at Evaluation unless otherwise noted.  DIAGNOSTIC FINDINGS: none  PATIENT SURVEYS:  LEFS    MUSCLE LENGTH: N/T  POSTURE: flexed R knee  10/21/24 PALPATION: Good patellar mobility, no redness/warmth and minimal swelling/edema  LOWER EXTREMITY ROM:  A/PROM Right eval Left eval RT 10/21/24 RT 10/26/24  Hip flexion      Hip extension      Hip abduction      Hip adduction      Hip internal rotation      Hip external rotation      Knee flexion 55/60d  AAROM 80d 72 P tx   Knee extension -16/-6d  AROM 10d lacking 7  Ankle dorsiflexion      Ankle plantarflexion      Ankle inversion      Ankle eversion       (Blank rows = not tested)  LOWER EXTREMITY MMT:  MMT Right eval Left eval  Hip flexion    Hip extension    Hip abduction    Hip adduction    Hip internal rotation    Hip external rotation    Knee flexion 3/5   Knee extension 3/5   Ankle dorsiflexion    Ankle plantarflexion    Ankle inversion    Ankle eversion     (Blank rows = not tested)  LOWER EXTREMITY  SPECIAL TESTS:  Deferred post-op  FUNCTIONAL TESTS:  30 seconds chair stand test 1 with UE assist  GAIT: Distance walked: 67ftx2 Assistive device utilized: Walker - 2 wheeled Level of assistance: Modified independence Comments: slow cadence, step through pattern  TREATMENT:  [facility] Adult PT Treatment:                                                DATE: 10/26/2024 Tibiofemoral Ap grade II with active flexion between sets MTPR along the R vastus lateralis and taught spouse how to perform at home  Seated tibial femoral distraciton with knee manual knee flexion, with medial pateller glide to alleviate pain Quad set 1 x 10 hodling 5 seconds Standing weight shifting for involuntary quad activation Recumbent bike 1/2 revolutions x 5 min  Ice pack with elevation x 10 min  Updated HEP today.   [facility] Adult PT Treatment:                                                DATE: 10/21/24 Therapeutic Exercise: Seated Knee Flexion Slide x10 Seated knee flexion c pt overpressure opp leg x5 20 Assisted Supine heel slides x10  c strap Supine heel slides x2 20 over pressure c strap Supine Quad Set x10 5 Supine AA SLR c strap progressing to Ind c min quad lag x10 Supine SAQ x5 3 Modalities: CP c elevation to the R knee at 10 mins  [facility] Adult PT Treatment:                                                DATE: 10/12/24 Eval and HEP  Self Care: Additional minutes spent for educating on updated Therapeutic Home Exercise Program as well as comparing current status to condition at start of symptoms. This included exercises focusing on stretching, strengthening, with focus on eccentric aspects. Long term goals include an improvement in range of motion, strength, endurance as well as avoiding reinjury. Patient's frequency would include in 1-2 times a day, 3-5 times a week for a duration of  6-12 weeks. Proper technique shown and discussed handout in great detail. All questions were discussed and addressed.     PATIENT EDUCATION:  Education details: Discussed eval findings, rehab rationale and POC and patient is in agreement  Person educated: Patient and Spouse Education method: Explanation and Handouts Education comprehension: verbalized understanding and needs further education  HOME EXERCISE PROGRAM: Access Code: [email or web address] URL: [email or web address] Date: 10/26/2024 Prepared by: Joneen Fresh  Program Notes forward / backward weight shifting with the right foot advanced forward. when rocking forward put weight on the right leg, when rocking backward rock on to the left leg and pull the toes up on the right foot and tighten your quad and straighten your knee.seated on high table/ surface, swinging the right and left knee. Swing R knee forward while left knee goes back and vice versa.   Exercises - Supine Quad Set  - 5 x daily - 5 x weekly - 1 sets - 10-15 reps - Active Straight Leg Raise with Quad Set  - 5 x daily - 7 x weekly - 1 sets - 10 reps - 3 hold - Supine Knee Extension Strengthening  - 5 x daily - 7 x weekly - 1 sets - 10 reps - 3 hold - Supine Heel Slide with  Strap  - 5 x daily - 5 x weekly - 1 sets - 10-15 reps - Seated Knee Flexion Slide  - 5 x daily - 5 x weekly - 1 sets - 10-15 reps  ASSESSMENT:  CLINICAL IMPRESSION: 11/17/2025Mrs Guerin arrives to session noting some pain today reated at 2-3/10 which she had taken pain meds before session. Continued working on ROM which today measured 8 to 72 degrees which she demonstrated limited knee flexion secondary to swelling and medial patellar pain which was alleviated with medial patellar glide/ pressure. Worked on quad activation and knee ROM which visually she was able to get AAROM to 90 degrees (assessed visually). Utilized ice end of session to calm down soreness. Addressed patient questions end  of session.   Patient is a 58 y.o. female who was seen today for physical therapy evaluation and treatment following R TKA.   Patient is approximately 2 weeks postop with Aquacel dressing in place without signs of redness drainage or excessive warmth noted at knee and around dressing edges.  Patient ambulates with a rolling walker using a step through gait with slow cadence and decreased step length bilaterally.  She is able to transfer out of a chair independently but requires upper extremity assist to place right lower extremity onto table.  Patient is extremely fearful of knee flexion due to pain levels.  Patient is able to demonstrate good quad control and facilitation with extension range of motion improving throughout session.  Knee flexion remain patient's main limitation as she was apprehensive about pain.  Patient is a good candidate for outpatient rehab to regain right knee strength and function.  She has a 2-week postop follow-up with her surgeons office and has been recommended to discuss optimum pain relieving techniques to allow more comfortable participation in rehab.  OBJECTIVE IMPAIRMENTS: Abnormal gait, decreased activity tolerance, decreased balance, decreased coordination, decreased endurance, decreased knowledge of condition, decreased mobility, difficulty walking, decreased ROM, decreased strength, increased fascial restrictions, impaired perceived functional ability, improper body mechanics, and pain.   ACTIVITY LIMITATIONS: carrying, lifting, sitting, standing, squatting, stairs, transfers, bed mobility, toileting, and locomotion level  PERSONAL FACTORS: Behavior pattern, Fitness, and Past/current experiences are also affecting patient's functional outcome.   REHAB POTENTIAL: Good  CLINICAL DECISION MAKING: Stable/uncomplicated  EVALUATION COMPLEXITY: Low   GOALS: Goals reviewed with patient? No  SHORT TERM GOALS: Target date: 11/09/2024   Patient to demonstrate  independence in HEP  Baseline: [email or web address] Goal status: ONGOING  2.  222ft ambulation with LRAD and appropriate gait pattern Baseline: 76ft with RW and step through pattern Goal status: INITIAL  3.  90d AROM knee flexion Baseline: 55d Goal status: INITIAL   LONG TERM GOALS: Target date: 12/07/2024    Patient will acknowledge 4/10 pain at least once during episode of care   Baseline: 8/10 Goal status: INITIAL  2.  Increase AROM of R knee to 115d flexion -5d extension Baseline:  A/PROM Right eval Left eval  Hip flexion    Hip extension    Hip abduction    Hip adduction    Hip internal rotation    Hip external rotation    Knee flexion 55/60d   Knee extension -16/-6d    Goal status: INITIAL  3.  Increase R knee strength to 4/5 Baseline:  MMT Right eval Left eval  Hip flexion    Hip extension    Hip abduction    Hip adduction    Hip internal rotation    Hip external rotation  Knee flexion 3/5   Knee extension 3/5    Goal status: INITIAL  4.  Patient to negotiate 16 stairs with most appropriate pattern and assist. Baseline: TBD Goal status: INITIAL  5.  Patient will score at least 60/80 on LEFS to signify clinically meaningful improvement in functional abilities.   Baseline: 10/80 Goal status: INITIAL     PLAN:  PT FREQUENCY: 1-2x/week  PT DURATION: 8 weeks  PLANNED INTERVENTIONS: 97110-Therapeutic exercises, 97530- Therapeutic activity, 97112- Neuromuscular re-education, 97535- Self Care, [id number]- Manual therapy, [phone]- Gait training, Patient/Family education, Balance training, and Stair training  PLAN FOR NEXT SESSION: HEP review and update, manual techniques as appropriate, aerobic tasks, ROM and flexibility activities, strengthening and PREs, TPDN, gait and balance training,aquatic therapy, modalities for pain and NMRE     Elhadj Girton PT, DPT, LAT, ATC  10/26/24  12:19 PM

## 2024-10-28 ENCOUNTER — Ambulatory Visit: Admitting: Physical Therapy

## 2024-10-28 DIAGNOSIS — M25561 Pain in right knee: Secondary | ICD-10-CM | POA: Diagnosis not present

## 2024-10-28 DIAGNOSIS — R2689 Other abnormalities of gait and mobility: Secondary | ICD-10-CM | POA: Diagnosis not present

## 2024-10-28 DIAGNOSIS — M6281 Muscle weakness (generalized): Secondary | ICD-10-CM

## 2024-10-28 DIAGNOSIS — Z96651 Presence of right artificial knee joint: Secondary | ICD-10-CM | POA: Diagnosis not present

## 2024-10-28 NOTE — Therapy (Signed)
 OUTPATIENT PHYSICAL THERAPY LOWER EXTREMITY EVALUATION   Patient Name: Kayla Archer MRN: [id number] DOB:[birth year], 58 y.o., female Today's Date: 10/28/2024  END OF SESSION:  PT End of Session - 10/28/24 1105     Visit Number 4    Number of Visits 16    Date for Recertification  12/12/24    Authorization Type [employer]    PT Start Time 1104    PT Stop Time 1218    PT Time Calculation (min) 74 min    Activity Tolerance Patient tolerated treatment well    Behavior During Therapy WFL for tasks assessed/performed             Past Medical History:  Diagnosis Date   Cartilage tear    rt knee   CHB (complete heart block) (HCC) 04/24/2017   Overview:  History: Post-op  Assessment: CHB with escape rate 50-60's. S/p PPM placement 05.22.2018.   PMW intact. On low-dose BB.   Plan: Continue low dose BB at discharge. Follow EP recommendations at discharge.   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 04/30/2017 06/24/2017   PONV (postoperative nausea and vomiting)    plus hypotension   Past Surgical History:  Procedure Laterality Date   CARDIAC VALVE REPLACEMENT  04   thoracic aortic aneurysm plus valve   INSERT / REPLACE / REMOVE PACEMAKER     at [facility] 04/2017 for complete heart block   KNEE ARTHROSCOPY Right 12/28/2013   Procedure: ARTHROSCOPY KNEE;  Surgeon: Marcey Raman, MD;  Location: [facility] OR;  Service: Orthopedics;  Laterality: Right;   LABIOPLASTY Bilateral 10/04/2016   Procedure: LABIAL REDUCTION;  Surgeon: Rosaline Cobble, MD;  Location: [facility] ORS;  Service: Gynecology;  Laterality: Bilateral;   TEE WITHOUT CARDIOVERSION N/A 02/08/2017   Procedure: TRANSESOPHAGEAL ECHOCARDIOGRAM (TEE);  Surgeon: Jerel Balding, MD;  Location: [facility] ENDOSCOPY;  Service: Cardiovascular;  Laterality: N/A;   VALVE REPLACEMENT  04/2017 [facility]   Patient Active  Problem List   Diagnosis Date Noted   Acute medial meniscus tear of right knee 06/20/2023   Other tear of medial meniscus, current injury, left knee, subsequent encounter 06/20/2023   Heart disease 05/09/2021   Encounter for care of pacemaker 09/27/2019   Piriformis syndrome of left side 06/16/2019   H/O thoracic aortic aneurysm repair 02/14/2019   Other cytomegaloviral diseases (HCC) 02/14/2019   Vitamin D deficiency 02/14/2019   Degenerative arthritis of right knee 05/14/2018   Nontraumatic neck pain 10/25/2017   Nonallopathic lesion of cervical region 10/25/2017   Nonallopathic lesion of rib cage 10/25/2017   Hx of Lyme disease 07/15/2017   Pacemaker Medtronic 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 04/30/2017 06/24/2017   Atrial flutter (HCC) 05/02/2017   Transition of care performed with sharing of clinical summary 04/26/2017   CHB (complete heart block) (HCC) 04/24/2017   PFO (patent foramen ovale) 03/13/2017   Prosthetic aortic valve stenosis    Nonallopathic lesion of lumbosacral region 11/20/2016   Nonallopathic lesion of sacral region 11/20/2016   Nonallopathic lesion of thoracic region 11/20/2016   Left lumbar radiculopathy 11/13/2016   Left knee injury 03/29/2016   S/P arthroscopy of knee 11/02/2014   Mid sternal chest pain 03/31/2014   Aortic regurgitation 03/31/2014   S/P aortic valve replacement with bioprosthetic valve 03/31/2014   Knee joint pain 10/20/2013   Vitiligo 08/09/2003  PCP: Jacques Camie Pepper, PA-C   REFERRING PROVIDER: Viann Jayson Raddle., MD  REFERRING DIAG: sTATUSW POST RIGHT KNEE  THERAPY DIAG:  Acute pain of right knee  Other abnormalities of gait and mobility  Muscle weakness (generalized)  Rationale for Evaluation and Treatment: Rehabilitation  ONSET DATE: 09/29/24 DOS  SUBJECTIVE:   SUBJECTIVE STATEMENT: 10/28/2024 I was completely wiped after the last session.  EVAL: Presents to OPPT following R TKA following long  standing history of R knee pain and dysfunction and unsuccessful conservative measures.  Patient focused on pain level with reluctance to rely on pain meds for fear of reliance on them.    PERTINENT HISTORY: None available PAIN:  Are you having pain? Yes: NPRS scale: 2/10 Pain location: R knee Pain description: ache, throb, sharp Aggravating factors: movement, activity Relieving factors: rest, meds  PRECAUTIONS: None for knee, pacemaker  RED FLAGS: None   WEIGHT BEARING RESTRICTIONS: No  FALLS:  Has patient fallen in last 6 months? No  LIVING ENVIRONMENT: Lives with: lives with their family Lives in: House/apartment Stairs: 2 Has following equipment at home: Environmental Consultant - 2 wheeled  OCCUPATION: not working  PLOF: Independent  PATIENT GOALS: To regain my knee function  NEXT MD VISIT: 10/13/24  OBJECTIVE:  Note: Objective measures were completed at Evaluation unless otherwise noted.  DIAGNOSTIC FINDINGS: none  PATIENT SURVEYS:  LEFS    MUSCLE LENGTH: N/T  POSTURE: flexed R knee  10/21/24 PALPATION: Good patellar mobility, no redness/warmth and minimal swelling/edema  LOWER EXTREMITY ROM:  A/PROM Right eval Left eval RT 10/21/24 RT 10/26/24 RT 10/28/24  Hip flexion       Hip extension       Hip abduction       Hip adduction       Hip internal rotation       Hip external rotation       Knee flexion 55/60d  AAROM 80d 72 A tx  88 A, 95 A  Knee extension -16/-6d  AROM 10d lacking 7   Ankle dorsiflexion       Ankle plantarflexion       Ankle inversion       Ankle eversion        (Blank rows = not tested)  LOWER EXTREMITY MMT:  MMT Right eval Left eval  Hip flexion    Hip extension    Hip abduction    Hip adduction    Hip internal rotation    Hip external rotation    Knee flexion 3/5   Knee extension 3/5   Ankle dorsiflexion    Ankle plantarflexion    Ankle inversion    Ankle eversion     (Blank rows = not tested)  LOWER EXTREMITY SPECIAL  TESTS:  Deferred post-op  FUNCTIONAL TESTS:  30 seconds chair stand test 1 with UE assist  GAIT: Distance walked: 70ftx2 Assistive device utilized: Walker - 2 wheeled Level of assistance: Modified independence Comments: slow cadence, step through pattern  TREATMENT:  [facility] Adult PT Treatment:                                                DATE: 10/28/2024 Tibiofemoral Ap grade II with active flexion between sets X friction massage along incision Bridge 2 x 10 blocking knee to keep flexed throughout.  Seated hamstring curl 1 x 10 with RTB Recumbent bike 1/2 revolutions, and 4 backward full revolutions.  Knee flexion while blocking popliteal space Heel strike/ toe off 1 x 20 ft with RW, 2 x 20 ft with SPC with CGA for safety Ice x 10 min with elevation.  Reviewed/ updated HEP  [facility] Adult PT Treatment:                                                DATE: 10/26/2024 Tibiofemoral Ap grade II with active flexion between sets MTPR along the R vastus lateralis and taught spouse how to perform at home  Seated tibial femoral distraciton with knee manual knee flexion, with medial pateller glide to alleviate pain Quad set 1 x 10 hodling 5 seconds Standing weight shifting for involuntary quad activation Recumbent bike 1/2 revolutions x 5 min  Ice pack with elevation x 10 min  Updated HEP today.   [facility] Adult PT Treatment:                                                DATE: 10/21/24 Therapeutic Exercise: Seated Knee Flexion Slide x10 Seated knee flexion c pt overpressure opp leg x5 20 Assisted Supine heel slides x10  c strap Supine heel slides x2 20 over pressure c strap Supine Quad Set x10 5 Supine AA SLR c strap progressing to Ind c min quad lag x10 Supine SAQ x5 3 Modalities: CP c elevation to the R knee at 10 mins  [facility] Adult PT Treatment:                                                 DATE: 10/12/24 Eval and HEP  Self Care: Additional minutes spent for educating on updated Therapeutic Home Exercise Program as well as comparing current status to condition at start of symptoms. This included exercises focusing on stretching, strengthening, with focus on eccentric aspects. Long term goals include an improvement in range of motion, strength, endurance as well as avoiding reinjury. Patient's frequency would include in 1-2 times a day, 3-5 times a week for a duration of 6-12 weeks. Proper technique shown and discussed handout in great detail. All questions were discussed and addressed.     PATIENT EDUCATION:  Education details: Discussed eval findings, rehab rationale and POC and patient is in agreement  Person educated: Patient and Spouse Education method: Explanation and Handouts Education comprehension: verbalized understanding and needs further education  HOME EXERCISE PROGRAM: Access Code: [email or web address] URL: [email or web address] Date: 10/28/2024 Prepared by: Joneen Fresh  Program Notes forward / backward weight shifting with the right foot advanced forward.  when rocking forward put weight on the right leg, when rocking backward rock on to the left leg and pull the toes up on the right foot and tighten your quad and straighten your knee.seated on high table/ surface, swinging the right and left knee. Swing R knee forward while left knee goes back and vice versa.   Exercises - Supine Quad Set  - 5 x daily - 5 x weekly - 1 sets - 10-15 reps - Active Straight Leg Raise with Quad Set  - 5 x daily - 7 x weekly - 1 sets - 10 reps - 3 hold - Supine Knee Extension Strengthening  - 5 x daily - 7 x weekly - 1 sets - 10 reps - 3 hold - Supine Heel Slide with Strap  - 5 x daily - 5 x weekly - 1 sets - 10-15 reps - Seated Knee Flexion Slide  - 5 x daily - 5 x weekly - 1 sets - 10-15 reps - Seated Hamstring Curl with Anchored Resistance  (Mirrored)  - 1 x daily - 7 x weekly - 3 sets - 10 reps  ASSESSMENT:  CLINICAL IMPRESSION: 11/19/2025Mrs Schofield arrives to session reporting last session wiped her out but overall that she felt she was doing better. Continued working on ROM with tibiofemoral mobs, and strengthening. She was able to to get 4 full backward revolutions today. She did well with her gait training using both RW and SPC with CGA for safety. Focused ROM on flexion which she began the session at 88 degrees and end at 95 both actively. Addressed patient specific questions, and finished session with ice.   EVAL: Patient is a 58 y.o. female who was seen today for physical therapy evaluation and treatment following R TKA.   Patient is approximately 2 weeks postop with Aquacel dressing in place without signs of redness drainage or excessive warmth noted at knee and around dressing edges.  Patient ambulates with a rolling walker using a step through gait with slow cadence and decreased step length bilaterally.  She is able to transfer out of a chair independently but requires upper extremity assist to place right lower extremity onto table.  Patient is extremely fearful of knee flexion due to pain levels.  Patient is able to demonstrate good quad control and facilitation with extension range of motion improving throughout session.  Knee flexion remain patient's main limitation as she was apprehensive about pain.  Patient is a good candidate for outpatient rehab to regain right knee strength and function.  She has a 2-week postop follow-up with her surgeons office and has been recommended to discuss optimum pain relieving techniques to allow more comfortable participation in rehab.  OBJECTIVE IMPAIRMENTS: Abnormal gait, decreased activity tolerance, decreased balance, decreased coordination, decreased endurance, decreased knowledge of condition, decreased mobility, difficulty walking, decreased ROM, decreased strength, increased fascial  restrictions, impaired perceived functional ability, improper body mechanics, and pain.   ACTIVITY LIMITATIONS: carrying, lifting, sitting, standing, squatting, stairs, transfers, bed mobility, toileting, and locomotion level  PERSONAL FACTORS: Behavior pattern, Fitness, and Past/current experiences are also affecting patient's functional outcome.   REHAB POTENTIAL: Good  CLINICAL DECISION MAKING: Stable/uncomplicated  EVALUATION COMPLEXITY: Low   GOALS: Goals reviewed with patient? No  SHORT TERM GOALS: Target date: 11/09/2024   Patient to demonstrate independence in HEP  Baseline: [email or web address] Goal status: ONGOING  2.  297ft ambulation with LRAD and appropriate gait pattern Baseline: 32ft with RW and step through pattern Goal status: INITIAL  3.  90d AROM knee flexion Baseline: 55d Goal status: INITIAL   LONG TERM GOALS: Target date: 12/07/2024    Patient will acknowledge 4/10 pain at least once during episode of care   Baseline: 8/10 Goal status: INITIAL  2.  Increase AROM of R knee to 115d flexion -5d extension Baseline:  A/PROM Right eval Left eval  Hip flexion    Hip extension    Hip abduction    Hip adduction    Hip internal rotation    Hip external rotation    Knee flexion 55/60d   Knee extension -16/-6d    Goal status: INITIAL  3.  Increase R knee strength to 4/5 Baseline:  MMT Right eval Left eval  Hip flexion    Hip extension    Hip abduction    Hip adduction    Hip internal rotation    Hip external rotation    Knee flexion 3/5   Knee extension 3/5    Goal status: INITIAL  4.  Patient to negotiate 16 stairs with most appropriate pattern and assist. Baseline: TBD Goal status: INITIAL  5.  Patient will score at least 60/80 on LEFS to signify clinically meaningful improvement in functional abilities.   Baseline: 10/80 Goal status: INITIAL     PLAN:  PT FREQUENCY: 1-2x/week  PT DURATION: 8 weeks  PLANNED INTERVENTIONS:  97110-Therapeutic exercises, 97530- Therapeutic activity, 97112- Neuromuscular re-education, 97535- Self Care, [id number]- Manual therapy, [phone]- Gait training, Patient/Family education, Balance training, and Stair training  PLAN FOR NEXT SESSION: HEP review and update, manual techniques as appropriate, aerobic tasks, ROM and flexibility activities, strengthening and PREs, TPDN, gait and balance training,aquatic therapy, modalities for pain and NMRE     Jaiven Graveline PT, DPT, LAT, ATC  10/28/24  12:30 PM

## 2024-11-02 ENCOUNTER — Ambulatory Visit: Admitting: Physical Therapy

## 2024-11-02 ENCOUNTER — Ambulatory Visit: Payer: Self-pay | Admitting: Physical Therapy

## 2024-11-02 ENCOUNTER — Encounter: Payer: Self-pay | Admitting: Physical Therapy

## 2024-11-02 DIAGNOSIS — N3000 Acute cystitis without hematuria: Secondary | ICD-10-CM | POA: Diagnosis not present

## 2024-11-02 DIAGNOSIS — R3915 Urgency of urination: Secondary | ICD-10-CM | POA: Diagnosis not present

## 2024-11-02 DIAGNOSIS — R2689 Other abnormalities of gait and mobility: Secondary | ICD-10-CM

## 2024-11-02 DIAGNOSIS — G4700 Insomnia, unspecified: Secondary | ICD-10-CM | POA: Diagnosis not present

## 2024-11-02 DIAGNOSIS — Z96651 Presence of right artificial knee joint: Secondary | ICD-10-CM

## 2024-11-02 DIAGNOSIS — M25561 Pain in right knee: Secondary | ICD-10-CM | POA: Diagnosis not present

## 2024-11-02 DIAGNOSIS — M6281 Muscle weakness (generalized): Secondary | ICD-10-CM | POA: Diagnosis not present

## 2024-11-02 NOTE — Therapy (Signed)
 OUTPATIENT PHYSICAL THERAPY LOWER EXTREMITY EVALUATION   Patient Name: Kayla Archer MRN: [id number] DOB:[birth year], 58 y.o., female Today's Date: 11/02/2024  END OF SESSION:  PT End of Session - 11/02/24 1005     Visit Number 5    Number of Visits 16    Date for Recertification  12/12/24    Authorization Type BC[employer]    PT Start Time 1004    PT Stop Time 1047    PT Time Calculation (min) 43 min    Activity Tolerance Patient tolerated treatment well    Behavior During Therapy WFL for tasks assessed/performed              Past Medical History:  Diagnosis Date   Cartilage tear    rt knee   CHB (complete heart block) (HCC) 04/24/2017   Overview:  History: Post-op  Assessment: CHB with escape rate 50-60's. S/p PPM placement 05.22.2018.   PMW intact. On low-dose BB.   Plan: Continue low dose BB at discharge. Follow EP recommendations at discharge.   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 04/30/2017 06/24/2017   PONV (postoperative nausea and vomiting)    plus hypotension   Past Surgical History:  Procedure Laterality Date   CARDIAC VALVE REPLACEMENT  04   thoracic aortic aneurysm plus valve   INSERT / REPLACE / REMOVE PACEMAKER     at [facility] 04/2017 for complete heart block   KNEE ARTHROSCOPY Right 12/28/2013   Procedure: ARTHROSCOPY KNEE;  Surgeon: Marcey Raman, MD;  Location: [facility] OR;  Service: Orthopedics;  Laterality: Right;   LABIOPLASTY Bilateral 10/04/2016   Procedure: LABIAL REDUCTION;  Surgeon: Rosaline Cobble, MD;  Location: [facility] ORS;  Service: Gynecology;  Laterality: Bilateral;   TEE WITHOUT CARDIOVERSION N/A 02/08/2017   Procedure: TRANSESOPHAGEAL ECHOCARDIOGRAM (TEE);  Surgeon: Jerel Balding, MD;  Location: [facility] ENDOSCOPY;  Service: Cardiovascular;  Laterality: N/A;   VALVE REPLACEMENT  04/2017 [facility]   Patient  Active Problem List   Diagnosis Date Noted   Acute medial meniscus tear of right knee 06/20/2023   Other tear of medial meniscus, current injury, left knee, subsequent encounter 06/20/2023   Heart disease 05/09/2021   Encounter for care of pacemaker 09/27/2019   Piriformis syndrome of left side 06/16/2019   H/O thoracic aortic aneurysm repair 02/14/2019   Other cytomegaloviral diseases (HCC) 02/14/2019   Vitamin D deficiency 02/14/2019   Degenerative arthritis of right knee 05/14/2018   Nontraumatic neck pain 10/25/2017   Nonallopathic lesion of cervical region 10/25/2017   Nonallopathic lesion of rib cage 10/25/2017   Hx of Lyme disease 07/15/2017   Pacemaker Medtronic 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 04/30/2017 06/24/2017   Atrial flutter (HCC) 05/02/2017   Transition of care performed with sharing of clinical summary 04/26/2017   CHB (complete heart block) (HCC) 04/24/2017   PFO (patent foramen ovale) 03/13/2017   Prosthetic aortic valve stenosis    Nonallopathic lesion of lumbosacral region 11/20/2016   Nonallopathic lesion of sacral region 11/20/2016   Nonallopathic lesion of thoracic region 11/20/2016   Left lumbar radiculopathy 11/13/2016   Left knee injury 03/29/2016   S/P arthroscopy of knee 11/02/2014   Mid sternal chest pain 03/31/2014   Aortic regurgitation 03/31/2014   S/P aortic valve replacement with bioprosthetic valve 03/31/2014   Knee joint pain 10/20/2013   Vitiligo  08/09/2003    PCP: Jacques Camie Pepper, PA-C   REFERRING PROVIDER: Viann Jayson Raddle., MD  REFERRING DIAG: sTATUSW POST RIGHT KNEE  THERAPY DIAG:  Acute pain of right knee  Other abnormalities of gait and mobility  Muscle weakness (generalized)  History of arthroplasty of right knee  Rationale for Evaluation and Treatment: Rehabilitation  ONSET DATE: 09/29/24 DOS  SUBJECTIVE:   SUBJECTIVE STATEMENT: 11/02/2024 Pt states that she was tired following last visit, but notes  continued improvement. Pt states that she wants to transition off the walker. Pt states that she was able to initiate some amb around the house without the walker but furniture surfs for stability.   EVAL: Presents to OPPT following R TKA following long standing history of R knee pain and dysfunction and unsuccessful conservative measures.  Patient focused on pain level with reluctance to rely on pain meds for fear of reliance on them.    PERTINENT HISTORY: None available PAIN:  Are you having pain? Yes: NPRS scale: 2/10 Pain location: R knee Pain description: ache, throb, sharp Aggravating factors: movement, activity Relieving factors: rest, meds  PRECAUTIONS: None for knee, pacemaker  RED FLAGS: None   WEIGHT BEARING RESTRICTIONS: No  FALLS:  Has patient fallen in last 6 months? No  LIVING ENVIRONMENT: Lives with: lives with their family Lives in: House/apartment Stairs: 2 Has following equipment at home: Environmental Consultant - 2 wheeled  OCCUPATION: not working  PLOF: Independent  PATIENT GOALS: To regain my knee function  NEXT MD VISIT: 10/13/24  OBJECTIVE:  Note: Objective measures were completed at Evaluation unless otherwise noted.  DIAGNOSTIC FINDINGS: none  PATIENT SURVEYS:  LEFS    MUSCLE LENGTH: N/T  POSTURE: flexed R knee  10/21/24 PALPATION: Good patellar mobility, no redness/warmth and minimal swelling/edema  LOWER EXTREMITY ROM:  A/PROM Right eval Left eval RT 10/21/24 RT 10/26/24 RT 10/28/24 RT 11/02/24  Hip flexion        Hip extension        Hip abduction        Hip adduction        Hip internal rotation        Hip external rotation        Knee flexion 55/60d  AAROM 80d 72 A tx  88 A, 95 A 100 AAROM  Knee extension -16/-6d  AROM 10d lacking 7    Ankle dorsiflexion        Ankle plantarflexion        Ankle inversion        Ankle eversion         (Blank rows = not tested)  LOWER EXTREMITY MMT:  MMT Right eval Left eval  Hip flexion     Hip extension    Hip abduction    Hip adduction    Hip internal rotation    Hip external rotation    Knee flexion 3/5   Knee extension 3/5   Ankle dorsiflexion    Ankle plantarflexion    Ankle inversion    Ankle eversion     (Blank rows = not tested)  LOWER EXTREMITY SPECIAL TESTS:  Deferred post-op  FUNCTIONAL TESTS:  30 seconds chair stand test 1 with UE assist  GAIT: Distance walked: 32ftx2 Assistive device utilized: Walker - 2 wheeled Level of assistance: Modified independence Comments: slow cadence, step through pattern  TREATMENT:  [facility] Adult PT Treatment:                                                DATE: 11/02/24 Therapeutic Exercise: Recumbent bike x5 mins partial pedal progressing to full cycle no resistance Knee flexion extension on physioball in sitting x30 Manual Therapy: IASTM to R knee. Light pressure and short strokes from proximal to distal beginning at distal 1/3rd of thigh working to lateral knee. Well tolerated. Extra time spent on vastus lateralis with redness consistent with treatment goals. Well tolerated and educated on home completion prior to exercise or functional activity.  Gait training: Pt trials SPC with step to pattern. Pt planning to get cane after todays session. Encouraged to maintain step through pattern, focus on weight bearing and coordination early on and progress to heel toe as indicated. Improves with reps and is better when pt able to maintain straight distance vs obstacle negotiation.    [facility] Adult PT Treatment:                                                DATE: 10/28/2024 Tibiofemoral Ap grade II with active flexion between sets X friction massage along incision Bridge 2 x 10 blocking knee to keep flexed throughout.  Seated hamstring curl 1 x 10 with RTB Recumbent bike 1/2 revolutions, and 4 backward full  revolutions.  Knee flexion while blocking popliteal space Heel strike/ toe off 1 x 20 ft with RW, 2 x 20 ft with SPC with CGA for safety Ice x 10 min with elevation.  Reviewed/ updated HEP  [facility] Adult PT Treatment:                                                DATE: 10/26/2024 Tibiofemoral Ap grade II with active flexion between sets MTPR along the R vastus lateralis and taught spouse how to perform at home  Seated tibial femoral distraciton with knee manual knee flexion, with medial pateller glide to alleviate pain Quad set 1 x 10 hodling 5 seconds Standing weight shifting for involuntary quad activation Recumbent bike 1/2 revolutions x 5 min  Ice pack with elevation x 10 min  Updated HEP today.   [facility] Adult PT Treatment:                                                DATE: 10/21/24 Therapeutic Exercise: Seated Knee Flexion Slide x10 Seated knee flexion c pt overpressure opp leg x5 20 Assisted Supine heel slides x10  c strap Supine heel slides x2 20 over pressure c strap Supine Quad Set x10 5 Supine AA SLR c strap progressing to Ind c min quad lag x10 Supine SAQ x5 3 Modalities: CP c elevation to the R knee at 10 mins    PATIENT EDUCATION:  Education details: Discussed eval findings, rehab rationale and POC and patient is in agreement  Person educated: Patient and Spouse Education method: Explanation and  Handouts Education comprehension: verbalized understanding and needs further education  HOME EXERCISE PROGRAM: Access Code: [email or web address] URL: [email or web address] Date: 10/28/2024 Prepared by: Joneen Fresh  Program Notes forward / backward weight shifting with the right foot advanced forward. when rocking forward put weight on the right leg, when rocking backward rock on to the left leg and pull the toes up on the right foot and tighten your quad and straighten your knee.seated on high table/ surface, swinging the right and left knee. Swing R knee  forward while left knee goes back and vice versa.   Exercises - Supine Quad Set  - 5 x daily - 5 x weekly - 1 sets - 10-15 reps - Active Straight Leg Raise with Quad Set  - 5 x daily - 7 x weekly - 1 sets - 10 reps - 3 hold - Supine Knee Extension Strengthening  - 5 x daily - 7 x weekly - 1 sets - 10 reps - 3 hold - Supine Heel Slide with Strap  - 5 x daily - 5 x weekly - 1 sets - 10-15 reps - Seated Knee Flexion Slide  - 5 x daily - 5 x weekly - 1 sets - 10-15 reps - Seated Hamstring Curl with Anchored Resistance (Mirrored)  - 1 x daily - 7 x weekly - 3 sets - 10 reps  ASSESSMENT:  CLINICAL IMPRESSION: 11/24/2025Pt has been responding well to her current regimen, she is fearful of being behind in therapy. Pt has been able to demonstrate consistent progress over the last few sessions and is encouraged by findings today. Pt to continue to address ROM, strength, stability as tolerated and will initiate gait training with SPC.    EVAL: Patient is a 58 y.o. female who was seen today for physical therapy evaluation and treatment following R TKA.   Patient is approximately 2 weeks postop with Aquacel dressing in place without signs of redness drainage or excessive warmth noted at knee and around dressing edges.  Patient ambulates with a rolling walker using a step through gait with slow cadence and decreased step length bilaterally.  She is able to transfer out of a chair independently but requires upper extremity assist to place right lower extremity onto table.  Patient is extremely fearful of knee flexion due to pain levels.  Patient is able to demonstrate good quad control and facilitation with extension range of motion improving throughout session.  Knee flexion remain patient's main limitation as she was apprehensive about pain.  Patient is a good candidate for outpatient rehab to regain right knee strength and function.  She has a 2-week postop follow-up with her surgeons office and has been  recommended to discuss optimum pain relieving techniques to allow more comfortable participation in rehab.  OBJECTIVE IMPAIRMENTS: Abnormal gait, decreased activity tolerance, decreased balance, decreased coordination, decreased endurance, decreased knowledge of condition, decreased mobility, difficulty walking, decreased ROM, decreased strength, increased fascial restrictions, impaired perceived functional ability, improper body mechanics, and pain.   ACTIVITY LIMITATIONS: carrying, lifting, sitting, standing, squatting, stairs, transfers, bed mobility, toileting, and locomotion level  PERSONAL FACTORS: Behavior pattern, Fitness, and Past/current experiences are also affecting patient's functional outcome.   REHAB POTENTIAL: Good  CLINICAL DECISION MAKING: Stable/uncomplicated  EVALUATION COMPLEXITY: Low   GOALS: Goals reviewed with patient? No  SHORT TERM GOALS: Target date: 11/09/2024   Patient to demonstrate independence in HEP  Baseline: [email or web address] Goal status: ONGOING  2.  275ft ambulation with LRAD and appropriate gait pattern Baseline: 52ft with RW  and step through pattern Goal status: INITIAL  3.  90d AROM knee flexion Baseline: 55d Goal status: INITIAL   LONG TERM GOALS: Target date: 12/07/2024    Patient will acknowledge 4/10 pain at least once during episode of care   Baseline: 8/10 Goal status: INITIAL  2.  Increase AROM of R knee to 115d flexion -5d extension Baseline:  A/PROM Right eval Left eval  Hip flexion    Hip extension    Hip abduction    Hip adduction    Hip internal rotation    Hip external rotation    Knee flexion 55/60d   Knee extension -16/-6d    Goal status: INITIAL  3.  Increase R knee strength to 4/5 Baseline:  MMT Right eval Left eval  Hip flexion    Hip extension    Hip abduction    Hip adduction    Hip internal rotation    Hip external rotation    Knee flexion 3/5   Knee extension 3/5    Goal status: INITIAL  4.   Patient to negotiate 16 stairs with most appropriate pattern and assist. Baseline: TBD Goal status: INITIAL  5.  Patient will score at least 60/80 on LEFS to signify clinically meaningful improvement in functional abilities.   Baseline: 10/80 Goal status: INITIAL     PLAN:  PT FREQUENCY: 1-2x/week  PT DURATION: 8 weeks  PLANNED INTERVENTIONS: 97110-Therapeutic exercises, 97530- Therapeutic activity, 97112- Neuromuscular re-education, 97535- Self Care, [id number]- Manual therapy, [phone]- Gait training, Patient/Family education, Balance training, and Stair training  PLAN FOR NEXT SESSION: HEP review and update, manual techniques as appropriate, aerobic tasks, ROM and flexibility activities, strengthening and PREs, TPDN, gait and balance training,aquatic therapy, modalities for pain and NMRE     Stann Ohara PT, DPT, CLT, CES 11/02/24 12:31 PM

## 2024-11-03 ENCOUNTER — Ambulatory Visit

## 2024-11-03 NOTE — Therapy (Signed)
 OUTPATIENT PHYSICAL THERAPY LOWER EXTREMITY EVALUATION   Patient Name: Kayla Archer MRN: [id number] DOB:28-Nov-1966, 58 y.o., female Today's Date: 11/04/2024  END OF SESSION:  PT End of Session - 11/04/24 1106     Visit Number 6    Number of Visits 16    Date for Recertification  12/12/24    Authorization Type [employer]    PT Start Time 1105    PT Stop Time 1151    PT Time Calculation (min) 46 min               Past Medical History:  Diagnosis Date   Cartilage tear    rt knee   CHB (complete heart block) (HCC) 04/24/2017   Overview:  History: Post-op  Assessment: CHB with escape rate 50-60's. S/p PPM placement 05.22.2018.   PMW intact. On low-dose BB.   Plan: Continue low dose BB at discharge. Follow EP recommendations at discharge.   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 04/30/2017 06/24/2017   PONV (postoperative nausea and vomiting)    plus hypotension   Past Surgical History:  Procedure Laterality Date   CARDIAC VALVE REPLACEMENT  04   thoracic aortic aneurysm plus valve   INSERT / REPLACE / REMOVE PACEMAKER     at [facility] 04/2017 for complete heart block   KNEE ARTHROSCOPY Right 12/28/2013   Procedure: ARTHROSCOPY KNEE;  Surgeon: Marcey Raman, MD;  Location: [facility];  Service: Orthopedics;  Laterality: Right;   LABIOPLASTY Bilateral 10/04/2016   Procedure: LABIAL REDUCTION;  Surgeon: Rosaline Cobble, MD;  Location: [facility] ORS;  Service: Gynecology;  Laterality: Bilateral;   TEE WITHOUT CARDIOVERSION N/A 02/08/2017   Procedure: TRANSESOPHAGEAL ECHOCARDIOGRAM (TEE);  Surgeon: Jerel Balding, MD;  Location: [facility];  Service: Cardiovascular;  Laterality: N/A;   VALVE REPLACEMENT  04/2017 [facility]   Patient Active Problem List   Diagnosis Date Noted   Acute medial meniscus tear of right knee 06/20/2023   Other tear of  medial meniscus, current injury, left knee, subsequent encounter 06/20/2023   Heart disease 05/09/2021   Encounter for care of pacemaker 09/27/2019   Piriformis syndrome of left side 06/16/2019   H/O thoracic aortic aneurysm repair 02/14/2019   Other cytomegaloviral diseases (HCC) 02/14/2019   Vitamin D deficiency 02/14/2019   Degenerative arthritis of right knee 05/14/2018   Nontraumatic neck pain 10/25/2017   Nonallopathic lesion of cervical region 10/25/2017   Nonallopathic lesion of rib cage 10/25/2017   Hx of Lyme disease 07/15/2017   Pacemaker Medtronic 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 04/30/2017 06/24/2017   Atrial flutter (HCC) 05/02/2017   Transition of care performed with sharing of clinical summary 04/26/2017   CHB (complete heart block) (HCC) 04/24/2017   PFO (patent foramen ovale) 03/13/2017   Prosthetic aortic valve stenosis    Nonallopathic lesion of lumbosacral region 11/20/2016   Nonallopathic lesion of sacral region 11/20/2016   Nonallopathic lesion of thoracic region 11/20/2016   Left lumbar radiculopathy 11/13/2016   Left knee injury 03/29/2016   S/P arthroscopy of knee 11/02/2014   Mid sternal chest pain 03/31/2014   Aortic regurgitation 03/31/2014   S/P aortic valve replacement with bioprosthetic valve 03/31/2014   Knee joint pain 10/20/2013   Vitiligo 08/09/2003    PCP: Jacques Camie Pepper, PA-C   REFERRING PROVIDER: Viann Jayson Raddle., MD  REFERRING DIAG: sTATUSW POST RIGHT KNEE  THERAPY DIAG:  Acute pain of right knee  Other abnormalities of gait and mobility  Muscle weakness (generalized)  Rationale for Evaluation and Treatment: Rehabilitation  ONSET DATE: 09/29/24 DOS  SUBJECTIVE:   SUBJECTIVE STATEMENT: 11/04/2024  I got a cane to use at home, I had some soft tissue work done yesterday and they gave me some new stretches.  EVAL: Presents to OPPT following R TKA following long standing history of R knee pain and dysfunction and  unsuccessful conservative measures.  Patient focused on pain level with reluctance to rely on pain meds for fear of reliance on them.    PERTINENT HISTORY: None available PAIN:  Are you having pain? Yes: NPRS scale: 2/10 Pain location: R knee Pain description: ache, throb, sharp Aggravating factors: movement, activity Relieving factors: rest, meds  PRECAUTIONS: None for knee, pacemaker  RED FLAGS: None   WEIGHT BEARING RESTRICTIONS: No  FALLS:  Has patient fallen in last 6 months? No  LIVING ENVIRONMENT: Lives with: lives with their family Lives in: House/apartment Stairs: 2 Has following equipment at home: Environmental Consultant - 2 wheeled  OCCUPATION: not working  PLOF: Independent  PATIENT GOALS: To regain my knee function  NEXT MD VISIT: 10/13/24  OBJECTIVE:  Note: Objective measures were completed at Evaluation unless otherwise noted.  DIAGNOSTIC FINDINGS: none  PATIENT SURVEYS:  LEFS    MUSCLE LENGTH: N/T  POSTURE: flexed R knee  10/21/24 PALPATION: Good patellar mobility, no redness/warmth and minimal swelling/edema  LOWER EXTREMITY ROM:  A/PROM Right eval Left eval RT 10/21/24 RT 10/26/24 RT 10/28/24 RT 11/02/24 RT 11/04/24  Hip flexion         Hip extension         Hip abduction         Hip adduction         Hip internal rotation         Hip external rotation         Knee flexion 55/60d  AAROM 80d 72 A tx  88 A, 95 A 100 AAROM 100 A, 110A  Knee extension -16/-6d  AROM 10d lacking 7     Ankle dorsiflexion         Ankle plantarflexion         Ankle inversion         Ankle eversion          (Blank rows = not tested)  LOWER EXTREMITY MMT:  MMT Right eval Left eval  Hip flexion    Hip extension    Hip abduction    Hip adduction    Hip internal rotation    Hip external rotation    Knee flexion 3/5   Knee extension 3/5   Ankle dorsiflexion    Ankle plantarflexion    Ankle inversion    Ankle eversion     (Blank rows = not tested)  LOWER  EXTREMITY SPECIAL TESTS:  Deferred post-op  FUNCTIONAL TESTS:  30 seconds chair stand test 1 with UE assist  GAIT: Distance walked: 59ftx2 Assistive device utilized: Walker - 2 wheeled Level of assistance: Modified independence Comments: slow cadence, step through pattern  TREATMENT:  [facility] Adult PT Treatment:                                                DATE: 11/04/24 Contract/ relax for thomas test stretch 4 x 30  Contract/ relax hamstring stretch 3 x 30  LAD RLE grade III LAQ with ball squeeze 1 x 10, 1 x 10 with 4# Sit to stand with ball between the knee Seated hamstring cur x 6 min lowering seat every 2 min   [facility] Adult PT Treatment:                                                DATE: 11/02/24 Therapeutic Exercise: Recumbent bike x5 mins partial pedal progressing to full cycle no resistance Knee flexion extension on physioball in sitting x30 Manual Therapy: IASTM to R knee. Light pressure and short strokes from proximal to distal beginning at distal 1/3rd of thigh working to lateral knee. Well tolerated. Extra time spent on vastus lateralis with redness consistent with treatment goals. Well tolerated and educated on home completion prior to exercise or functional activity.  Gait training: Pt trials SPC with step to pattern. Pt planning to get cane after todays session. Encouraged to maintain step through pattern, focus on weight bearing and coordination early on and progress to heel toe as indicated. Improves with reps and is better when pt able to maintain straight distance vs obstacle negotiation.    [facility] Adult PT Treatment:                                                DATE: 10/28/2024 Tibiofemoral Ap grade II with active flexion between sets X friction massage along incision Bridge 2 x 10 blocking knee to keep flexed throughout.  Seated hamstring curl  1 x 10 with RTB Recumbent bike 1/2 revolutions, and 4 backward full revolutions.  Knee flexion while blocking popliteal space Heel strike/ toe off 1 x 20 ft with RW, 2 x 20 ft with SPC with CGA for safety Ice x 10 min with elevation.  Reviewed/ updated HEP   PATIENT EDUCATION:  Education details: Discussed eval findings, rehab rationale and POC and patient is in agreement  Person educated: Patient and Spouse Education method: Explanation and Handouts Education comprehension: verbalized understanding and needs further education  HOME EXERCISE PROGRAM: Access Code: [email or web address] URL: [email or web address] Date: 10/28/2024 Prepared by: Joneen Fresh  Program Notes forward / backward weight shifting with the right foot advanced forward. when rocking forward put weight on the right leg, when rocking backward rock on to the left leg and pull the toes up on the right foot and tighten your quad and straighten your knee.seated on high table/ surface, swinging the right and left knee. Swing R knee forward while left knee goes back and vice versa.   Exercises - Supine Quad Set  - 5 x daily - 5 x weekly - 1 sets - 10-15 reps - Active Straight Leg Raise with Quad Set  - 5 x daily - 7 x weekly - 1 sets - 10 reps - 3 hold -  Supine Knee Extension Strengthening  - 5 x daily - 7 x weekly - 1 sets - 10 reps - 3 hold - Supine Heel Slide with Strap  - 5 x daily - 5 x weekly - 1 sets - 10-15 reps - Seated Knee Flexion Slide  - 5 x daily - 5 x weekly - 1 sets - 10-15 reps - Seated Hamstring Curl with Anchored Resistance (Mirrored)  - 1 x daily - 7 x weekly - 3 sets - 10 reps  ASSESSMENT:  CLINICAL IMPRESSION: 11/04/2024 Sharnese arrived to session using a SPC and noting she has been using at home. Continued working on quad and hamstring stretching which she is making good progress and started at 100 degrees of flexion and finished today's session at 110. Worked on quad activation with emphasis  on VMO to reduce medial knee pain/ tension and taught how to perform medial patellar glides. She denied modalities today.   EVAL: Patient is a 58 y.o. female who was seen today for physical therapy evaluation and treatment following R TKA.   Patient is approximately 2 weeks postop with Aquacel dressing in place without signs of redness drainage or excessive warmth noted at knee and around dressing edges.  Patient ambulates with a rolling walker using a step through gait with slow cadence and decreased step length bilaterally.  She is able to transfer out of a chair independently but requires upper extremity assist to place right lower extremity onto table.  Patient is extremely fearful of knee flexion due to pain levels.  Patient is able to demonstrate good quad control and facilitation with extension range of motion improving throughout session.  Knee flexion remain patient's main limitation as she was apprehensive about pain.  Patient is a good candidate for outpatient rehab to regain right knee strength and function.  She has a 2-week postop follow-up with her surgeons office and has been recommended to discuss optimum pain relieving techniques to allow more comfortable participation in rehab.  OBJECTIVE IMPAIRMENTS: Abnormal gait, decreased activity tolerance, decreased balance, decreased coordination, decreased endurance, decreased knowledge of condition, decreased mobility, difficulty walking, decreased ROM, decreased strength, increased fascial restrictions, impaired perceived functional ability, improper body mechanics, and pain.   ACTIVITY LIMITATIONS: carrying, lifting, sitting, standing, squatting, stairs, transfers, bed mobility, toileting, and locomotion level  PERSONAL FACTORS: Behavior pattern, Fitness, and Past/current experiences are also affecting patient's functional outcome.   REHAB POTENTIAL: Good  CLINICAL DECISION MAKING: Stable/uncomplicated  EVALUATION COMPLEXITY:  Low   GOALS: Goals reviewed with patient? No  SHORT TERM GOALS: Target date: 11/09/2024   Patient to demonstrate independence in HEP  Baseline: [email or web address] Goal status: ONGOING  2.  272ft ambulation with LRAD and appropriate gait pattern Baseline: 67ft with RW and step through pattern Goal status: INITIAL  3.  90d AROM knee flexion Baseline: 55d Goal status: INITIAL   LONG TERM GOALS: Target date: 12/07/2024    Patient will acknowledge 4/10 pain at least once during episode of care   Baseline: 8/10 Goal status: INITIAL  2.  Increase AROM of R knee to 115d flexion -5d extension Baseline:  A/PROM Right eval Left eval  Hip flexion    Hip extension    Hip abduction    Hip adduction    Hip internal rotation    Hip external rotation    Knee flexion 55/60d   Knee extension -16/-6d    Goal status: INITIAL  3.  Increase R knee strength to 4/5 Baseline:  MMT Right eval  Left eval  Hip flexion    Hip extension    Hip abduction    Hip adduction    Hip internal rotation    Hip external rotation    Knee flexion 3/5   Knee extension 3/5    Goal status: INITIAL  4.  Patient to negotiate 16 stairs with most appropriate pattern and assist. Baseline: TBD Goal status: INITIAL  5.  Patient will score at least 60/80 on LEFS to signify clinically meaningful improvement in functional abilities.   Baseline: 10/80 Goal status: INITIAL     PLAN:  PT FREQUENCY: 1-2x/week  PT DURATION: 8 weeks  PLANNED INTERVENTIONS: 97110-Therapeutic exercises, 97530- Therapeutic activity, 97112- Neuromuscular re-education, 97535- Self Care, [id number]- Manual therapy, [phone]- Gait training, Patient/Family education, Balance training, and Stair training  PLAN FOR NEXT SESSION: HEP review and update, manual techniques as appropriate, aerobic tasks, ROM and flexibility activities, strengthening and PREs, TPDN, gait and balance training,aquatic therapy, modalities for pain and NMRE      Loribeth Katich PT, DPT, LAT, ATC  11/04/24  12:38 PM

## 2024-11-04 ENCOUNTER — Encounter: Payer: Self-pay | Admitting: Physical Therapy

## 2024-11-04 ENCOUNTER — Ambulatory Visit: Admitting: Physical Therapy

## 2024-11-04 DIAGNOSIS — M6281 Muscle weakness (generalized): Secondary | ICD-10-CM | POA: Diagnosis not present

## 2024-11-04 DIAGNOSIS — R2689 Other abnormalities of gait and mobility: Secondary | ICD-10-CM

## 2024-11-04 DIAGNOSIS — M25561 Pain in right knee: Secondary | ICD-10-CM | POA: Diagnosis not present

## 2024-11-04 DIAGNOSIS — Z96651 Presence of right artificial knee joint: Secondary | ICD-10-CM | POA: Diagnosis not present

## 2024-11-09 ENCOUNTER — Ambulatory Visit: Admitting: Physical Therapy

## 2024-11-09 ENCOUNTER — Encounter: Payer: Self-pay | Admitting: Physical Therapy

## 2024-11-09 DIAGNOSIS — M25561 Pain in right knee: Secondary | ICD-10-CM | POA: Insufficient documentation

## 2024-11-09 DIAGNOSIS — Z96651 Presence of right artificial knee joint: Secondary | ICD-10-CM | POA: Insufficient documentation

## 2024-11-09 DIAGNOSIS — M6281 Muscle weakness (generalized): Secondary | ICD-10-CM | POA: Insufficient documentation

## 2024-11-09 DIAGNOSIS — R2689 Other abnormalities of gait and mobility: Secondary | ICD-10-CM | POA: Insufficient documentation

## 2024-11-09 NOTE — Therapy (Signed)
 OUTPATIENT PHYSICAL THERAPY TREATMENT  Patient Name: Kayla Archer MRN: [id number] DOB:10/05/1966, 58 y.o., female Today's Date: 11/09/2024  END OF SESSION:  PT End of Session - 11/09/24 1416     Visit Number 7    Number of Visits 16    Date for Recertification  12/12/24    Authorization Type BC[employer]    PT Start Time 1417    PT Stop Time 1518    PT Time Calculation (min) 61 min    Activity Tolerance Patient tolerated treatment well    Behavior During Therapy [facility] for tasks assessed/performed                Past Medical History:  Diagnosis Date   Cartilage tear    rt knee   CHB (complete heart block) (HCC) 04/24/2017   Overview:  History: Post-op  Assessment: CHB with escape rate 50-60's. S/p PPM placement 05.22.2018.   PMW intact. On low-dose BB.   Plan: Continue low dose BB at discharge. Follow EP recommendations at discharge.   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 04/30/2017 06/24/2017   PONV (postoperative nausea and vomiting)    plus hypotension   Past Surgical History:  Procedure Laterality Date   CARDIAC VALVE REPLACEMENT  04   thoracic aortic aneurysm plus valve   INSERT / REPLACE / REMOVE PACEMAKER     at [facility] 04/2017 for complete heart block   KNEE ARTHROSCOPY Right 12/28/2013   Procedure: ARTHROSCOPY KNEE;  Surgeon: Marcey Raman, MD;  Location: [facility] OR;  Service: Orthopedics;  Laterality: Right;   LABIOPLASTY Bilateral 10/04/2016   Procedure: LABIAL REDUCTION;  Surgeon: Rosaline Cobble, MD;  Location: [facility] ORS;  Service: Gynecology;  Laterality: Bilateral;   TEE WITHOUT CARDIOVERSION N/A 02/08/2017   Procedure: TRANSESOPHAGEAL ECHOCARDIOGRAM (TEE);  Surgeon: Jerel Balding, MD;  Location: [facility] ENDOSCOPY;  Service: Cardiovascular;  Laterality: N/A;   VALVE REPLACEMENT  04/2017 [facility]   Patient Active Problem List    Diagnosis Date Noted   Acute medial meniscus tear of right knee 06/20/2023   Other tear of medial meniscus, current injury, left knee, subsequent encounter 06/20/2023   Heart disease 05/09/2021   Encounter for care of pacemaker 09/27/2019   Piriformis syndrome of left side 06/16/2019   H/O thoracic aortic aneurysm repair 02/14/2019   Other cytomegaloviral diseases (HCC) 02/14/2019   Vitamin D deficiency 02/14/2019   Degenerative arthritis of right knee 05/14/2018   Nontraumatic neck pain 10/25/2017   Nonallopathic lesion of cervical region 10/25/2017   Nonallopathic lesion of rib cage 10/25/2017   Hx of Lyme disease 07/15/2017   Pacemaker Medtronic 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 04/30/2017 06/24/2017   Atrial flutter (HCC) 05/02/2017   Transition of care performed with sharing of clinical summary 04/26/2017   CHB (complete heart block) (HCC) 04/24/2017   PFO (patent foramen ovale) 03/13/2017   Prosthetic aortic valve stenosis    Nonallopathic lesion of lumbosacral region 11/20/2016   Nonallopathic lesion of sacral region 11/20/2016   Nonallopathic lesion of thoracic region 11/20/2016   Left lumbar radiculopathy 11/13/2016   Left knee injury 03/29/2016   S/P arthroscopy of knee 11/02/2014   Mid sternal chest pain 03/31/2014   Aortic regurgitation 03/31/2014   S/P aortic valve replacement with bioprosthetic valve 03/31/2014   Knee joint pain 10/20/2013   Vitiligo 08/09/2003  PCP: Jacques Camie Pepper, PA-C   REFERRING PROVIDER: Viann Jayson Raddle., MD  REFERRING DIAG: sTATUSW POST RIGHT KNEE  THERAPY DIAG:  Acute pain of right knee  Other abnormalities of gait and mobility  Muscle weakness (generalized)  Rationale for Evaluation and Treatment: Rehabilitation  ONSET DATE: 09/29/24 DOS  SUBJECTIVE:   SUBJECTIVE STATEMENT: 11/09/2024 My frustration that I am still taking meds for pain/ inflammation, I feel like I still struggle at times with the  straightening.  EVAL: Presents to OPPT following R TKA following long standing history of R knee pain and dysfunction and unsuccessful conservative measures.  Patient focused on pain level with reluctance to rely on pain meds for fear of reliance on them.    PERTINENT HISTORY: None available PAIN:  Are you having pain? Yes: NPRS scale: 2.5/10 Pain location: R knee Pain description: ache, throb, sharp Aggravating factors: movement, activity Relieving factors: rest, meds  PRECAUTIONS: None for knee, pacemaker  RED FLAGS: None   WEIGHT BEARING RESTRICTIONS: No  FALLS:  Has patient fallen in last 6 months? No  LIVING ENVIRONMENT: Lives with: lives with their family Lives in: House/apartment Stairs: 2 Has following equipment at home: Environmental Consultant - 2 wheeled  OCCUPATION: not working  PLOF: Independent  PATIENT GOALS: To regain my knee function  NEXT MD VISIT: 10/13/24  OBJECTIVE:  Note: Objective measures were completed at Evaluation unless otherwise noted.  DIAGNOSTIC FINDINGS: none  PATIENT SURVEYS:  LEFS    MUSCLE LENGTH: N/T  POSTURE: flexed R knee  10/21/24 PALPATION: Good patellar mobility, no redness/warmth and minimal swelling/edema  LOWER EXTREMITY ROM:  A/PROM Right eval Left eval RT 10/21/24 RT 10/26/24 RT 10/28/24 RT 11/02/24 RT 11/04/24 RT 11/09/24  Hip flexion          Hip extension          Hip abduction          Hip adduction          Hip internal rotation          Hip external rotation          Knee flexion 55/60d  AAROM 80d 72 A tx  88 A, 95 A 100 AAROM 100 A, 110A 112  Knee extension -16/-6d  AROM 10d lacking 7    5  Ankle dorsiflexion          Ankle plantarflexion          Ankle inversion          Ankle eversion           (Blank rows = not tested)  LOWER EXTREMITY MMT:  MMT Right eval Left eval  Hip flexion    Hip extension    Hip abduction    Hip adduction    Hip internal rotation    Hip external rotation    Knee  flexion 3/5   Knee extension 3/5   Ankle dorsiflexion    Ankle plantarflexion    Ankle inversion    Ankle eversion     (Blank rows = not tested)  LOWER EXTREMITY SPECIAL TESTS:  Deferred post-op  FUNCTIONAL TESTS:  30 seconds chair stand test 1 with UE assist  GAIT: Distance walked: 51ftx2 Assistive device utilized: Walker - 2 wheeled Level of assistance: Modified independence Comments: slow cadence, step through pattern  TREATMENT:  [facility] Adult PT Treatment:                                                DATE: 11/09/2024 Recumbent bike L1 x 6 min lower seat every 2 min  TKE with ball against the wall  MTPR along the rectus femoris and vastus lateralis IASTM along the quad and patellar tendon Sidelying hip abduction 1 x  going to fatigue Stair training up/ down 6 in ch steps with SPC x 8 cues for proper form  Reviewed/ updated HEP today.   [facility] Adult PT Treatment:                                                DATE: 11/04/24 Contract/ relax for thomas test stretch 4 x 30  Contract/ relax hamstring stretch 3 x 30  LAD RLE grade III LAQ with ball squeeze 1 x 10, 1 x 10 with 4# Sit to stand with ball between the knee Seated hamstring cur x 6 min lowering seat every 2 min   [facility] Adult PT Treatment:                                                DATE: 11/02/24 Therapeutic Exercise: Recumbent bike x5 mins partial pedal progressing to full cycle no resistance Knee flexion extension on physioball in sitting x30 Manual Therapy: IASTM to R knee. Light pressure and short strokes from proximal to distal beginning at distal 1/3rd of thigh working to lateral knee. Well tolerated. Extra time spent on vastus lateralis with redness consistent with treatment goals. Well tolerated and educated on home completion prior to exercise or functional activity.  Gait training: Pt  trials SPC with step to pattern. Pt planning to get cane after todays session. Encouraged to maintain step through pattern, focus on weight bearing and coordination early on and progress to heel toe as indicated. Improves with reps and is better when pt able to maintain straight distance vs obstacle negotiation.    [facility] Adult PT Treatment:                                                DATE: 10/28/2024 Tibiofemoral Ap grade II with active flexion between sets X friction massage along incision Bridge 2 x 10 blocking knee to keep flexed throughout.  Seated hamstring curl 1 x 10 with RTB Recumbent bike 1/2 revolutions, and 4 backward full revolutions.  Knee flexion while blocking popliteal space Heel strike/ toe off 1 x 20 ft with RW, 2 x 20 ft with SPC with CGA for safety Ice x 10 min with elevation.  Reviewed/ updated HEP   PATIENT EDUCATION:  Education details: Discussed eval findings, rehab rationale and POC and patient is in agreement  Person educated: Patient and Spouse Education method: Explanation and Handouts Education comprehension: verbalized understanding and needs further education  HOME EXERCISE PROGRAM: Access Code: [email or web address] URL: [email or web address] Date: 10/28/2024 Prepared by: Joneen Fresh  Program Notes forward / backward weight shifting with the right foot advanced forward. when rocking forward put weight on the right leg, when rocking backward rock on to the left leg and pull the toes up on the right foot and tighten your quad and straighten your knee.seated on high table/ surface, swinging the right and left knee. Swing R knee forward while left knee goes back and vice versa.   Exercises - Supine Quad Set  - 5 x daily - 5 x weekly - 1 sets - 10-15 reps - Active Straight Leg Raise with Quad Set  - 5 x daily - 7 x weekly - 1 sets - 10 reps - 3 hold - Supine Knee Extension Strengthening  - 5 x daily - 7 x weekly - 1 sets - 10 reps - 3 hold -  Supine Heel Slide with Strap  - 5 x daily - 5 x weekly - 1 sets - 10-15 reps - Seated Knee Flexion Slide  - 5 x daily - 5 x weekly - 1 sets - 10-15 reps - Seated Hamstring Curl with Anchored Resistance (Mirrored)  - 1 x daily - 7 x weekly - 3 sets - 10 reps  ASSESSMENT:  CLINICAL IMPRESSION: 11/09/2024 Mrs Mcaleer arrives to session noting 2.5/10 pain and notes some frustration with quad stiffness and fluctuating challenged with knee ROM. Continued working on knee ROM through exercises which she came in today with total arc 5 - 112 degrees today. STW was performed along the quad and quad/ patellar tendons which were areas of her concordant symptoms. She did well with stair training but did required verbal cues/ demonstration for proper form. End of session she noted feeling better than when she arrived.  EVAL: Patient is a 58 y.o. female who was seen today for physical therapy evaluation and treatment following R TKA.   Patient is approximately 2 weeks postop with Aquacel dressing in place without signs of redness drainage or excessive warmth noted at knee and around dressing edges.  Patient ambulates with a rolling walker using a step through gait with slow cadence and decreased step length bilaterally.  She is able to transfer out of a chair independently but requires upper extremity assist to place right lower extremity onto table.  Patient is extremely fearful of knee flexion due to pain levels.  Patient is able to demonstrate good quad control and facilitation with extension range of motion improving throughout session.  Knee flexion remain patient's main limitation as she was apprehensive about pain.  Patient is a good candidate for outpatient rehab to regain right knee strength and function.  She has a 2-week postop follow-up with her surgeons office and has been recommended to discuss optimum pain relieving techniques to allow more comfortable participation in rehab.  OBJECTIVE IMPAIRMENTS: Abnormal  gait, decreased activity tolerance, decreased balance, decreased coordination, decreased endurance, decreased knowledge of condition, decreased mobility, difficulty walking, decreased ROM, decreased strength, increased fascial restrictions, impaired perceived functional ability, improper body mechanics, and pain.   ACTIVITY LIMITATIONS: carrying, lifting, sitting, standing, squatting, stairs, transfers, bed mobility, toileting, and locomotion level  PERSONAL FACTORS: Behavior pattern, Fitness, and Past/current experiences are also affecting patient's functional outcome.   REHAB POTENTIAL: Good  CLINICAL DECISION MAKING: Stable/uncomplicated  EVALUATION COMPLEXITY: Low   GOALS: Goals reviewed with patient? No  SHORT TERM GOALS: Target date: 11/09/2024   Patient to demonstrate independence in HEP  Baseline: [email or web address] Goal status: MET   2.  276ft ambulation with LRAD and appropriate  gait pattern Baseline: 70ft with RW and step through pattern Goal status: MET  3.  90d AROM knee flexion Baseline: 55d Goal status: MET   LONG TERM GOALS: Target date: 12/07/2024    Patient will acknowledge 4/10 pain at least once during episode of care   Baseline: 8/10 Goal status: INITIAL  2.  Increase AROM of R knee to 115d flexion -5d extension Baseline:  A/PROM Right eval Left eval  Hip flexion    Hip extension    Hip abduction    Hip adduction    Hip internal rotation    Hip external rotation    Knee flexion 55/60d   Knee extension -16/-6d    Goal status: INITIAL  3.  Increase R knee strength to 4/5 Baseline:  MMT Right eval Left eval  Hip flexion    Hip extension    Hip abduction    Hip adduction    Hip internal rotation    Hip external rotation    Knee flexion 3/5   Knee extension 3/5    Goal status: INITIAL  4.  Patient to negotiate 16 stairs with most appropriate pattern and assist. Baseline: TBD Goal status: INITIAL  5.  Patient will score at least 60/80 on  LEFS to signify clinically meaningful improvement in functional abilities.   Baseline: 10/80 Goal status: INITIAL     PLAN:  PT FREQUENCY: 1-2x/week  PT DURATION: 8 weeks  PLANNED INTERVENTIONS: 97110-Therapeutic exercises, 97530- Therapeutic activity, 97112- Neuromuscular re-education, 97535- Self Care, [id number]- Manual therapy, [phone]- Gait training, Patient/Family education, Balance training, and Stair training  PLAN FOR NEXT SESSION: HEP review and update, manual techniques as appropriate, aerobic tasks, ROM and flexibility activities, strengthening and PREs, TPDN, gait and balance training,aquatic therapy, modalities for pain and NMRE     Marypat Kimmet PT, DPT, LAT, ATC  11/09/24  3:38 PM

## 2024-11-11 ENCOUNTER — Ambulatory Visit: Payer: BC Managed Care – PPO

## 2024-11-11 DIAGNOSIS — I442 Atrioventricular block, complete: Secondary | ICD-10-CM

## 2024-11-12 ENCOUNTER — Ambulatory Visit: Admitting: Physical Therapy

## 2024-11-12 ENCOUNTER — Encounter: Payer: Self-pay | Admitting: Physical Therapy

## 2024-11-12 DIAGNOSIS — M25561 Pain in right knee: Secondary | ICD-10-CM

## 2024-11-12 DIAGNOSIS — M6281 Muscle weakness (generalized): Secondary | ICD-10-CM

## 2024-11-12 DIAGNOSIS — R2689 Other abnormalities of gait and mobility: Secondary | ICD-10-CM

## 2024-11-12 LAB — CUP PACEART REMOTE DEVICE CHECK
Battery Remaining Longevity: 61 mo
Battery Voltage: 2.96 V
Brady Statistic AP VP Percent: 34 %
Brady Statistic AP VS Percent: 0 %
Brady Statistic AS VP Percent: 65.99 %
Brady Statistic AS VS Percent: 0.01 %
Brady Statistic RA Percent Paced: 33.94 %
Brady Statistic RV Percent Paced: 99.99 %
Date Time Interrogation Session: 20251202230805
Implantable Lead Connection Status: 753985
Implantable Lead Connection Status: 753985
Implantable Lead Implant Date: 20180522
Implantable Lead Implant Date: 20180522
Implantable Lead Location: 753859
Implantable Lead Location: 753860
Implantable Lead Model: 5076
Implantable Lead Model: 5076
Implantable Pulse Generator Implant Date: 20180522
Lead Channel Impedance Value: 323 Ohm
Lead Channel Impedance Value: 361 Ohm
Lead Channel Impedance Value: 361 Ohm
Lead Channel Impedance Value: 456 Ohm
Lead Channel Pacing Threshold Amplitude: 0.375 V
Lead Channel Pacing Threshold Amplitude: 0.625 V
Lead Channel Pacing Threshold Pulse Width: 0.4 ms
Lead Channel Pacing Threshold Pulse Width: 0.4 ms
Lead Channel Sensing Intrinsic Amplitude: 14.625 mV
Lead Channel Sensing Intrinsic Amplitude: 14.625 mV
Lead Channel Sensing Intrinsic Amplitude: 2.125 mV
Lead Channel Sensing Intrinsic Amplitude: 2.125 mV
Lead Channel Setting Pacing Amplitude: 1.5 V
Lead Channel Setting Pacing Amplitude: 2 V
Lead Channel Setting Pacing Pulse Width: 0.4 ms
Lead Channel Setting Sensing Sensitivity: 0.9 mV
Zone Setting Status: 755011
Zone Setting Status: 755011

## 2024-11-12 NOTE — Therapy (Signed)
 OUTPATIENT PHYSICAL THERAPY TREATMENT  Patient Name: Kayla Archer MRN: [id number] DOB:06-Nov-1966, 58 y.o., female Today's Date: 11/12/2024  END OF SESSION:  PT End of Session - 11/12/24 1330     Visit Number 8    Number of Visits 16    Date for Recertification  12/12/24    Authorization Type BC[employer]    PT Start Time 1330    PT Stop Time 1423    PT Time Calculation (min) 53 min    Activity Tolerance Patient tolerated treatment well    Behavior During Therapy WFL for tasks assessed/performed                 Past Medical History:  Diagnosis Date   Cartilage tear    rt knee   CHB (complete heart block) (HCC) 04/24/2017   Overview:  History: Post-op  Assessment: CHB with escape rate 50-60's. S/p PPM placement 05.22.2018.   PMW intact. On low-dose BB.   Plan: Continue low dose BB at discharge. Follow EP recommendations at discharge.   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 04/30/2017 06/24/2017   PONV (postoperative nausea and vomiting)    plus hypotension   Past Surgical History:  Procedure Laterality Date   CARDIAC VALVE REPLACEMENT  04   thoracic aortic aneurysm plus valve   INSERT / REPLACE / REMOVE PACEMAKER     at [facility] 04/2017 for complete heart block   KNEE ARTHROSCOPY Right 12/28/2013   Procedure: ARTHROSCOPY KNEE;  Surgeon: Marcey Raman, MD;  Location: [facility] OR;  Service: Orthopedics;  Laterality: Right;   LABIOPLASTY Bilateral 10/04/2016   Procedure: LABIAL REDUCTION;  Surgeon: Rosaline Cobble, MD;  Location: [facility] ORS;  Service: Gynecology;  Laterality: Bilateral;   TEE WITHOUT CARDIOVERSION N/A 02/08/2017   Procedure: TRANSESOPHAGEAL ECHOCARDIOGRAM (TEE);  Surgeon: Jerel Balding, MD;  Location: [facility] ENDOSCOPY;  Service: Cardiovascular;  Laterality: N/A;   VALVE REPLACEMENT  04/2017 [facility]   Patient Active Problem List    Diagnosis Date Noted   Acute medial meniscus tear of right knee 06/20/2023   Other tear of medial meniscus, current injury, left knee, subsequent encounter 06/20/2023   Heart disease 05/09/2021   Encounter for care of pacemaker 09/27/2019   Piriformis syndrome of left side 06/16/2019   H/O thoracic aortic aneurysm repair 02/14/2019   Other cytomegaloviral diseases (HCC) 02/14/2019   Vitamin D deficiency 02/14/2019   Degenerative arthritis of right knee 05/14/2018   Nontraumatic neck pain 10/25/2017   Nonallopathic lesion of cervical region 10/25/2017   Nonallopathic lesion of rib cage 10/25/2017   Hx of Lyme disease 07/15/2017   Pacemaker Medtronic 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 04/30/2017 06/24/2017   Atrial flutter (HCC) 05/02/2017   Transition of care performed with sharing of clinical summary 04/26/2017   CHB (complete heart block) (HCC) 04/24/2017   PFO (patent foramen ovale) 03/13/2017   Prosthetic aortic valve stenosis    Nonallopathic lesion of lumbosacral region 11/20/2016   Nonallopathic lesion of sacral region 11/20/2016   Nonallopathic lesion of thoracic region 11/20/2016   Left lumbar radiculopathy 11/13/2016   Left knee injury 03/29/2016   S/P arthroscopy of knee 11/02/2014   Mid sternal chest pain 03/31/2014   Aortic regurgitation 03/31/2014   S/P aortic valve replacement with bioprosthetic valve 03/31/2014   Knee joint pain 10/20/2013   Vitiligo  08/09/2003    PCP: Jacques Camie Pepper, PA-C   REFERRING PROVIDER: Viann Jayson Raddle., MD  REFERRING DIAG: sTATUSW POST RIGHT KNEE  THERAPY DIAG:  Acute pain of right knee  Other abnormalities of gait and mobility  Muscle weakness (generalized)  Rationale for Evaluation and Treatment: Rehabilitation  ONSET DATE: 09/29/24 DOS  SUBJECTIVE:   SUBJECTIVE STATEMENT: 11/12/2024 The knee is doing okay, I am still medicated prior to coming in.   EVAL: Presents to OPPT following R TKA following long  standing history of R knee pain and dysfunction and unsuccessful conservative measures.  Patient focused on pain level with reluctance to rely on pain meds for fear of reliance on them.    PERTINENT HISTORY: None available PAIN:  Are you having pain? Yes: NPRS scale: 1/10 Pain location: R knee Pain description: ache, throb, sharp Aggravating factors: movement, activity Relieving factors: rest, meds  PRECAUTIONS: None for knee, pacemaker  RED FLAGS: None   WEIGHT BEARING RESTRICTIONS: No  FALLS:  Has patient fallen in last 6 months? No  LIVING ENVIRONMENT: Lives with: lives with their family Lives in: House/apartment Stairs: 2 Has following equipment at home: Environmental Consultant - 2 wheeled  OCCUPATION: not working  PLOF: Independent  PATIENT GOALS: To regain my knee function  NEXT MD VISIT: 10/13/24  OBJECTIVE:  Note: Objective measures were completed at Evaluation unless otherwise noted.  DIAGNOSTIC FINDINGS: none  PATIENT SURVEYS:  LEFS    MUSCLE LENGTH: N/T  POSTURE: flexed R knee  10/21/24 PALPATION: Good patellar mobility, no redness/warmth and minimal swelling/edema  LOWER EXTREMITY ROM:  A/PROM Right eval Left eval RT 10/21/24 RT 10/26/24 RT 10/28/24 RT 11/02/24 RT 11/04/24 RT 11/09/24 RT 11/12/24  Hip flexion           Hip extension           Hip abduction           Hip adduction           Hip internal rotation           Hip external rotation           Knee flexion 55/60d  AAROM 80d 72 A tx  88 A, 95 A 100 AAROM 100 A, 110A 112 116/120  Knee extension -16/-6d  AROM 10d lacking 7    5 5   Ankle dorsiflexion           Ankle plantarflexion           Ankle inversion           Ankle eversion            (Blank rows = not tested)  LOWER EXTREMITY MMT:  MMT Right eval Left eval  Hip flexion    Hip extension    Hip abduction    Hip adduction    Hip internal rotation    Hip external rotation    Knee flexion 3/5   Knee extension 3/5   Ankle  dorsiflexion    Ankle plantarflexion    Ankle inversion    Ankle eversion     (Blank rows = not tested)  LOWER EXTREMITY SPECIAL TESTS:  Deferred post-op  FUNCTIONAL TESTS:  30 seconds chair stand test 1 with UE assist  GAIT: Distance walked: 42ftx2 Assistive device utilized: Walker - 2 wheeled Level of assistance: Modified independence Comments: slow cadence, step through pattern  TREATMENT:  [facility] Adult PT Treatment:                                                DATE: 11/12/2024 Tibial ER mob with knee extension grade III Prone knee flexion stretch 2 x 60 sec Prone hamstring curl with hip extension 1  x 12 3#, prone hip extension 1 x 10 3# Standing foot in chair forward rocking 2 x 10 Standing marching in place focusing on control 1 x 25 Recumbent bike l1 x 8 min lowering seat ever 2 min  to promote knee flexion Gait training without SPC heel strike/ toe off.  [facility] Adult PT Treatment:                                                DATE: 11/09/2024 Recumbent bike L1 x 6 min lower seat every 2 min  TKE with ball against the wall  MTPR along the rectus femoris and vastus lateralis IASTM along the quad and patellar tendon Sidelying hip abduction 1 x  going to fatigue Stair training up/ down 6 in ch steps with SPC x 8 cues for proper form  Reviewed/ updated HEP today.   [facility] Adult PT Treatment:                                                DATE: 11/04/24 Contract/ relax for thomas test stretch 4 x 30  Contract/ relax hamstring stretch 3 x 30  LAD RLE grade III LAQ with ball squeeze 1 x 10, 1 x 10 with 4# Sit to stand with ball between the knee Seated hamstring cur x 6 min lowering seat every 2 min   [facility] Adult PT Treatment:                                                DATE: 11/02/24 Therapeutic Exercise: Recumbent bike x5 mins partial pedal progressing  to full cycle no resistance Knee flexion extension on physioball in sitting x30 Manual Therapy: IASTM to R knee. Light pressure and short strokes from proximal to distal beginning at distal 1/3rd of thigh working to lateral knee. Well tolerated. Extra time spent on vastus lateralis with redness consistent with treatment goals. Well tolerated and educated on home completion prior to exercise or functional activity.  Gait training: Pt trials SPC with step to pattern. Pt planning to get cane after todays session. Encouraged to maintain step through pattern, focus on weight bearing and coordination early on and progress to heel toe as indicated. Improves with reps and is better when pt able to maintain straight distance vs obstacle negotiation.    PATIENT EDUCATION:  Education details: Discussed eval findings, rehab rationale and POC and patient is in agreement  Person educated: Patient and Spouse Education method: Explanation and Handouts Education comprehension: verbalized understanding and needs further education  HOME EXERCISE PROGRAM: Access Code: [email or web address] URL: [email or web address] Date: 10/28/2024 Prepared by: Joneen Fresh  Program Notes  forward / backward weight shifting with the right foot advanced forward. when rocking forward put weight on the right leg, when rocking backward rock on to the left leg and pull the toes up on the right foot and tighten your quad and straighten your knee.seated on high table/ surface, swinging the right and left knee. Swing R knee forward while left knee goes back and vice versa.   Exercises - Supine Quad Set  - 5 x daily - 5 x weekly - 1 sets - 10-15 reps - Active Straight Leg Raise with Quad Set  - 5 x daily - 7 x weekly - 1 sets - 10 reps - 3 hold - Supine Knee Extension Strengthening  - 5 x daily - 7 x weekly - 1 sets - 10 reps - 3 hold - Supine Heel Slide with Strap  - 5 x daily - 5 x weekly - 1 sets - 10-15 reps - Seated Knee  Flexion Slide  - 5 x daily - 5 x weekly - 1 sets - 10-15 reps - Seated Hamstring Curl with Anchored Resistance (Mirrored)  - 1 x daily - 7 x weekly - 3 sets - 10 reps  ASSESSMENT:  CLINICAL IMPRESSION: 11/12/2024 Akiba arrives to session and continues to make steady progress with her knee ROM. She strated flexion at 116 degrees and finished session at 120. Continued to work on AROM / strengthening utilizing varying positions to maximize muscle activation and build confidence. End of session she noted feeling better .  EVAL: Patient is a 58 y.o. female who was seen today for physical therapy evaluation and treatment following R TKA.   Patient is approximately 2 weeks postop with Aquacel dressing in place without signs of redness drainage or excessive warmth noted at knee and around dressing edges.  Patient ambulates with a rolling walker using a step through gait with slow cadence and decreased step length bilaterally.  She is able to transfer out of a chair independently but requires upper extremity assist to place right lower extremity onto table.  Patient is extremely fearful of knee flexion due to pain levels.  Patient is able to demonstrate good quad control and facilitation with extension range of motion improving throughout session.  Knee flexion remain patient's main limitation as she was apprehensive about pain.  Patient is a good candidate for outpatient rehab to regain right knee strength and function.  She has a 2-week postop follow-up with her surgeons office and has been recommended to discuss optimum pain relieving techniques to allow more comfortable participation in rehab.  OBJECTIVE IMPAIRMENTS: Abnormal gait, decreased activity tolerance, decreased balance, decreased coordination, decreased endurance, decreased knowledge of condition, decreased mobility, difficulty walking, decreased ROM, decreased strength, increased fascial restrictions, impaired perceived functional ability, improper  body mechanics, and pain.   ACTIVITY LIMITATIONS: carrying, lifting, sitting, standing, squatting, stairs, transfers, bed mobility, toileting, and locomotion level  PERSONAL FACTORS: Behavior pattern, Fitness, and Past/current experiences are also affecting patient's functional outcome.   REHAB POTENTIAL: Good  CLINICAL DECISION MAKING: Stable/uncomplicated  EVALUATION COMPLEXITY: Low   GOALS: Goals reviewed with patient? No  SHORT TERM GOALS: Target date: 11/09/2024   Patient to demonstrate independence in HEP  Baseline: [id number] Goal status: MET   2.  229ft ambulation with LRAD and appropriate gait pattern Baseline: 37ft with RW and step through pattern Goal status: MET  3.  90d AROM knee flexion Baseline: 55d Goal status: MET   LONG TERM GOALS: Target date: 12/07/2024  Patient will acknowledge 4/10 pain at least once during episode of care   Baseline: 8/10 Goal status: INITIAL  2.  Increase AROM of R knee to 115d flexion -5d extension Baseline:  A/PROM Right eval Left eval  Hip flexion    Hip extension    Hip abduction    Hip adduction    Hip internal rotation    Hip external rotation    Knee flexion 55/60d   Knee extension -16/-6d    Goal status: INITIAL  3.  Increase R knee strength to 4/5 Baseline:  MMT Right eval Left eval  Hip flexion    Hip extension    Hip abduction    Hip adduction    Hip internal rotation    Hip external rotation    Knee flexion 3/5   Knee extension 3/5    Goal status: INITIAL  4.  Patient to negotiate 16 stairs with most appropriate pattern and assist. Baseline: TBD Goal status: INITIAL  5.  Patient will score at least 60/80 on LEFS to signify clinically meaningful improvement in functional abilities.   Baseline: 10/80 Goal status: INITIAL     PLAN:  PT FREQUENCY: 1-2x/week  PT DURATION: 8 weeks  PLANNED INTERVENTIONS: 97110-Therapeutic exercises, 97530- Therapeutic activity, 97112- Neuromuscular  re-education, 97535- Self Care, [id number]- Manual therapy, [phone]- Gait training, Patient/Family education, Balance training, and Stair training  PLAN FOR NEXT SESSION: HEP review and update, manual techniques as appropriate, aerobic tasks, ROM and flexibility activities, strengthening and PREs, TPDN, gait and balance training,aquatic therapy,   Ardie Dragoo PT, DPT, LAT, ATC  11/12/24  2:33 PM

## 2024-11-17 NOTE — Progress Notes (Signed)
 Remote PPM Transmission

## 2024-11-18 ENCOUNTER — Ambulatory Visit: Admitting: Physical Therapy

## 2024-11-18 DIAGNOSIS — R2689 Other abnormalities of gait and mobility: Secondary | ICD-10-CM

## 2024-11-18 DIAGNOSIS — M25561 Pain in right knee: Secondary | ICD-10-CM

## 2024-11-18 DIAGNOSIS — M6281 Muscle weakness (generalized): Secondary | ICD-10-CM

## 2024-11-18 NOTE — Therapy (Addendum)
 OUTPATIENT PHYSICAL THERAPY TREATMENT  Patient Name: Kayla Archer MRN: [id number] DOB:[birth year], 58 y.o., female Today's Date: 11/18/2024  END OF SESSION:  PT End of Session - 11/18/24 1105     Visit Number 9    Number of Visits 16    Date for Recertification  12/12/24    Authorization Type BC[employer]    PT Start Time 1105    PT Stop Time 1148    PT Time Calculation (min) 43 min                  Past Medical History:  Diagnosis Date   Cartilage tear    rt knee   CHB (complete heart block) (HCC) 04/24/2017   Overview:  History: Post-op  Assessment: CHB with escape rate 50-60's. S/p PPM placement 05.22.2018.   PMW intact. On low-dose BB.   Plan: Continue low dose BB at discharge. Follow EP recommendations at discharge.   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 04/30/2017 06/24/2017   PONV (postoperative nausea and vomiting)    plus hypotension   Past Surgical History:  Procedure Laterality Date   CARDIAC VALVE REPLACEMENT  04   thoracic aortic aneurysm plus valve   INSERT / REPLACE / REMOVE PACEMAKER     at [facility] 04/2017 for complete heart block   KNEE ARTHROSCOPY Right 12/28/2013   Procedure: ARTHROSCOPY KNEE;  Surgeon: Marcey Raman, MD;  Location: [facility] OR;  Service: Orthopedics;  Laterality: Right;   LABIOPLASTY Bilateral 10/04/2016   Procedure: LABIAL REDUCTION;  Surgeon: Rosaline Cobble, MD;  Location: [facility] ORS;  Service: Gynecology;  Laterality: Bilateral;   TEE WITHOUT CARDIOVERSION N/A 02/08/2017   Procedure: TRANSESOPHAGEAL ECHOCARDIOGRAM (TEE);  Surgeon: Jerel Balding, MD;  Location: [facility] ENDOSCOPY;  Service: Cardiovascular;  Laterality: N/A;   VALVE REPLACEMENT  04/2017 [facility]   Patient Active Problem List   Diagnosis Date Noted   Acute medial meniscus tear of right knee 06/20/2023   Other tear of medial meniscus,  current injury, left knee, subsequent encounter 06/20/2023   Heart disease 05/09/2021   Encounter for care of pacemaker 09/27/2019   Piriformis syndrome of left side 06/16/2019   H/O thoracic aortic aneurysm repair 02/14/2019   Other cytomegaloviral diseases (HCC) 02/14/2019   Vitamin D deficiency 02/14/2019   Degenerative arthritis of right knee 05/14/2018   Nontraumatic neck pain 10/25/2017   Nonallopathic lesion of cervical region 10/25/2017   Nonallopathic lesion of rib cage 10/25/2017   Hx of Lyme disease 07/15/2017   Pacemaker Medtronic 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 04/30/2017 06/24/2017   Atrial flutter (HCC) 05/02/2017   Transition of care performed with sharing of clinical summary 04/26/2017   CHB (complete heart block) (HCC) 04/24/2017   PFO (patent foramen ovale) 03/13/2017   Prosthetic aortic valve stenosis    Nonallopathic lesion of lumbosacral region 11/20/2016   Nonallopathic lesion of sacral region 11/20/2016   Nonallopathic lesion of thoracic region 11/20/2016   Left lumbar radiculopathy 11/13/2016   Left knee injury 03/29/2016   S/P arthroscopy of knee 11/02/2014   Mid sternal chest pain 03/31/2014   Aortic regurgitation 03/31/2014   S/P aortic valve replacement with bioprosthetic valve 03/31/2014   Knee joint pain 10/20/2013   Vitiligo 08/09/2003    PCP: Jacques Camie Pepper, PA-C   REFERRING PROVIDER: Viann Jayson Raddle., MD  REFERRING DIAG: sTATUSW POST RIGHT KNEE  THERAPY DIAG:  Acute pain of right knee  Other abnormalities of gait and mobility  Muscle weakness (generalized)  Rationale for Evaluation and Treatment: Rehabilitation  ONSET DATE: 09/29/24 DOS  SUBJECTIVE:   SUBJECTIVE STATEMENT: 11/18/2024  I saw the MD he was pleased with where I am with my current level of function.  EVAL: Presents to OPPT following R TKA following long standing history of R knee pain and dysfunction and unsuccessful conservative measures.  Patient  focused on pain level with reluctance to rely on pain meds for fear of reliance on them.    PERTINENT HISTORY: None available PAIN:  Are you having pain? Yes: NPRS scale: 1/10 Pain location: R knee Pain description: ache, throb, sharp Aggravating factors: movement, activity Relieving factors: rest, meds  PRECAUTIONS: None for knee, pacemaker  RED FLAGS: None   WEIGHT BEARING RESTRICTIONS: No  FALLS:  Has patient fallen in last 6 months? No  LIVING ENVIRONMENT: Lives with: lives with their family Lives in: House/apartment Stairs: 2 Has following equipment at home: Environmental Consultant - 2 wheeled  OCCUPATION: not working  PLOF: Independent  PATIENT GOALS: To regain my knee function  NEXT MD VISIT: 10/13/24  OBJECTIVE:  Note: Objective measures were completed at Evaluation unless otherwise noted.  DIAGNOSTIC FINDINGS: none  PATIENT SURVEYS:  LEFS    MUSCLE LENGTH: N/T  POSTURE: flexed R knee  10/21/24 PALPATION: Good patellar mobility, no redness/warmth and minimal swelling/edema  LOWER EXTREMITY ROM:  A/PROM Right eval Left eval RT 10/21/24 RT 10/26/24 RT 10/28/24 RT 11/02/24 RT 11/04/24 RT 11/09/24 RT 11/12/24 RT 11/18/2024  Hip flexion            Hip extension            Hip abduction            Hip adduction            Hip internal rotation            Hip external rotation            Knee flexion 55/60d  AAROM 80d 72 A tx  88 A, 95 A 100 AAROM 100 A, 110A 112 116/120 118  Knee extension -16/-6d  AROM 10d lacking 7    5 5    Ankle dorsiflexion            Ankle plantarflexion            Ankle inversion            Ankle eversion             (Blank rows = not tested)  LOWER EXTREMITY MMT:  MMT Right eval Left eval  Hip flexion    Hip extension    Hip abduction    Hip adduction    Hip internal rotation    Hip external rotation    Knee flexion 3/5   Knee extension 3/5   Ankle dorsiflexion    Ankle plantarflexion    Ankle inversion    Ankle  eversion     (Blank rows = not tested)  LOWER EXTREMITY SPECIAL TESTS:  Deferred post-op  FUNCTIONAL TESTS:  30 seconds chair stand test 1 with UE assist  GAIT: Distance walked: 11ftx2 Assistive device utilized: Walker - 2 wheeled Level of assistance: Modified independence Comments: slow cadence, step through pattern  TREATMENT:  [facility] Adult PT Treatment:                                                DATE: 11/18/24 Recumbent bike L 3 x 6 min lowering seat every 2 min to promote ROM Standing quat holding onto freemotion 1 x 10 Standing hip abduction/ extension bil 1 x 12 with RTB LAQ 1 x 10 with 10# bil. 1 x 5 con bil/ ecc RLE Sit to stand with LLE advanced 3 inches.  MTPR along the hamstring with tennis ball Tack and stretch along the hamstring with tennis ball Reviewed and updated HEP.   [facility] Adult PT Treatment:                                                DATE: 11/12/2024 Tibial ER mob with knee extension grade III Prone knee flexion stretch 2 x 60 sec Prone hamstring curl with hip extension 1  x 12 3#, prone hip extension 1 x 10 3# Standing foot in chair forward rocking 2 x 10 Standing marching in place focusing on control 1 x 25 Recumbent bike l1 x 8 min lowering seat ever 2 min  to promote knee flexion Gait training without SPC heel strike/ toe off.  [facility] Adult PT Treatment:                                                DATE: 11/09/2024 Recumbent bike L1 x 6 min lower seat every 2 min  TKE with ball against the wall  MTPR along the rectus femoris and vastus lateralis IASTM along the quad and patellar tendon Sidelying hip abduction 1 x  going to fatigue Stair training up/ down 6 in ch steps with SPC x 8 cues for proper form  Reviewed/ updated HEP today.   [facility] Adult PT Treatment:                                                DATE:  11/04/24 Contract/ relax for thomas test stretch 4 x 30  Contract/ relax hamstring stretch 3 x 30  LAD RLE grade III LAQ with ball squeeze 1 x 10, 1 x 10 with 4# Sit to stand with ball between the knee Seated hamstring cur x 6 min lowering seat every 2 min    PATIENT EDUCATION:  Education details: Discussed eval findings, rehab rationale and POC and patient is in agreement  Person educated: Patient and Spouse Education method: Explanation and Handouts Education comprehension: verbalized understanding and needs further education  HOME EXERCISE PROGRAM: Access Code: [email or web address] URL: [email or web address] Date: 11/18/2024 Prepared by: Joneen Fresh  Program Notes forward / backward weight shifting with the right foot advanced forward. when rocking forward put weight on the right leg, when rocking backward rock on to the left leg and pull the toes up on the right foot and tighten your quad and straighten your knee.seated on high table/ surface, swinging the right  and left knee. Swing R knee forward while left knee goes back and vice versa.   Exercises - Supine Quad Set  - 5 x daily - 5 x weekly - 1 sets - 10-15 reps - Active Straight Leg Raise with Quad Set  - 5 x daily - 7 x weekly - 1 sets - 10 reps - 3 hold - Supine Knee Extension Strengthening  - 5 x daily - 7 x weekly - 1 sets - 10 reps - 3 hold - Supine Heel Slide with Strap  - 5 x daily - 5 x weekly - 1 sets - 10-15 reps - Seated Knee Flexion Slide  - 5 x daily - 5 x weekly - 1 sets - 10-15 reps - Seated Hamstring Curl with Anchored Resistance (Mirrored)  - 1 x daily - 7 x weekly - 3 sets - 10 reps - Sit to Stand with Hands on Knees  - 1 x daily - 7 x weekly - 3 sets - 12 reps - Sidelying Hip Abduction  - 1 x daily - 7 x weekly - 3 sets - 15-20 reps - Standing Hip Abduction with Resistance at Ankles and Counter Support  - 1 x daily - 7 x weekly - 2 sets - 10-15 reps - Standing Hip Extension with Resistance at  Ankles and Counter Support  - 1 x daily - 7 x weekly - 2 sets - 10-15 reps - Seated Hamstring Stretch  - 2-3 x daily - 7 x weekly - 1-2 sets - 2 reps - 30 hold  ASSESSMENT:  CLINICAL IMPRESSION: 11/18/2024 Mrs Antonucci arrives to session noting that she saw her MD and that they are pleased with his current ROM and mobility. Focused todays session on strengthening which she did well with emphasis on hip strengthening. Performed STW along the hamstring to help reduce tension which she noted relief of tension end of session.   EVAL: Patient is a 58 y.o. female who was seen today for physical therapy evaluation and treatment following R TKA.   Patient is approximately 2 weeks postop with Aquacel dressing in place without signs of redness drainage or excessive warmth noted at knee and around dressing edges.  Patient ambulates with a rolling walker using a step through gait with slow cadence and decreased step length bilaterally.  She is able to transfer out of a chair independently but requires upper extremity assist to place right lower extremity onto table.  Patient is extremely fearful of knee flexion due to pain levels.  Patient is able to demonstrate good quad control and facilitation with extension range of motion improving throughout session.  Knee flexion remain patient's main limitation as she was apprehensive about pain.  Patient is a good candidate for outpatient rehab to regain right knee strength and function.  She has a 2-week postop follow-up with her surgeons office and has been recommended to discuss optimum pain relieving techniques to allow more comfortable participation in rehab.  OBJECTIVE IMPAIRMENTS: Abnormal gait, decreased activity tolerance, decreased balance, decreased coordination, decreased endurance, decreased knowledge of condition, decreased mobility, difficulty walking, decreased ROM, decreased strength, increased fascial restrictions, impaired perceived functional ability,  improper body mechanics, and pain.   ACTIVITY LIMITATIONS: carrying, lifting, sitting, standing, squatting, stairs, transfers, bed mobility, toileting, and locomotion level  PERSONAL FACTORS: Behavior pattern, Fitness, and Past/current experiences are also affecting patient's functional outcome.   REHAB POTENTIAL: Good  CLINICAL DECISION MAKING: Stable/uncomplicated  EVALUATION COMPLEXITY: Low   GOALS: Goals reviewed with patient?  No  SHORT TERM GOALS: Target date: 11/09/2024   Patient to demonstrate independence in HEP  Baseline: [email or web address] Goal status: MET   2.  241ft ambulation with LRAD and appropriate gait pattern Baseline: 65ft with RW and step through pattern Goal status: MET  3.  90d AROM knee flexion Baseline: 55d Goal status: MET   LONG TERM GOALS: Target date: 12/07/2024    Patient will acknowledge 4/10 pain at least once during episode of care   Baseline: 8/10 Goal status: INITIAL  2.  Increase AROM of R knee to 115d flexion -5d extension Baseline:  A/PROM Right eval Left eval  Hip flexion    Hip extension    Hip abduction    Hip adduction    Hip internal rotation    Hip external rotation    Knee flexion 55/60d   Knee extension -16/-6d    Goal status: INITIAL  3.  Increase R knee strength to 4/5 Baseline:  MMT Right eval Left eval  Hip flexion    Hip extension    Hip abduction    Hip adduction    Hip internal rotation    Hip external rotation    Knee flexion 3/5   Knee extension 3/5    Goal status: INITIAL  4.  Patient to negotiate 16 stairs with most appropriate pattern and assist. Baseline: TBD Goal status: INITIAL  5.  Patient will score at least 60/80 on LEFS to signify clinically meaningful improvement in functional abilities.   Baseline: 10/80 Goal status: INITIAL     PLAN:  PT FREQUENCY: 1-2x/week  PT DURATION: 8 weeks  PLANNED INTERVENTIONS: 97110-Therapeutic exercises, 97530- Therapeutic activity, 97112-  Neuromuscular re-education, 97535- Self Care, [id number]- Manual therapy, [phone]- Gait training, Patient/Family education, Balance training, and Stair training  PLAN FOR NEXT SESSION: HEP review and update, manual techniques as appropriate, aerobic tasks, ROM and flexibility activities, strengthening and PREs, TPDN, gait and balance training,aquatic therapy,   Emmalina Espericueta PT, DPT, LAT, ATC  11/18/24  3:06 PM

## 2024-11-23 ENCOUNTER — Ambulatory Visit: Admitting: Physical Therapy

## 2024-11-23 ENCOUNTER — Encounter: Payer: Self-pay | Admitting: Physical Therapy

## 2024-11-23 DIAGNOSIS — M6281 Muscle weakness (generalized): Secondary | ICD-10-CM

## 2024-11-23 DIAGNOSIS — M25561 Pain in right knee: Secondary | ICD-10-CM

## 2024-11-23 DIAGNOSIS — R2689 Other abnormalities of gait and mobility: Secondary | ICD-10-CM

## 2024-11-23 NOTE — Therapy (Signed)
 OUTPATIENT PHYSICAL THERAPY TREATMENT  Patient Name: Kayla Archer MRN: [id number] DOB:26-May-1966, 58 y.o., female Today's Date: 11/23/2024  END OF SESSION:  PT End of Session - 11/23/24 1103     Visit Number 10    Number of Visits 16    Date for Recertification  12/12/24    Authorization Type BC[employer]    PT Start Time 1104    PT Stop Time 1206    PT Time Calculation (min) 62 min    Activity Tolerance Patient tolerated treatment well    Behavior During Therapy WFL for tasks assessed/performed                   Past Medical History:  Diagnosis Date   Cartilage tear    rt knee   CHB (complete heart block) (HCC) 04/24/2017   Overview:  History: Post-op  Assessment: CHB with escape rate 50-60's. S/p PPM placement 05.22.2018.   PMW intact. On low-dose BB.   Plan: Continue low dose BB at discharge. Follow EP recommendations at discharge.   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 04/30/2017 06/24/2017   PONV (postoperative nausea and vomiting)    plus hypotension   Past Surgical History:  Procedure Laterality Date   CARDIAC VALVE REPLACEMENT  04   thoracic aortic aneurysm plus valve   INSERT / REPLACE / REMOVE PACEMAKER     at [facility] 04/2017 for complete heart block   KNEE ARTHROSCOPY Right 12/28/2013   Procedure: ARTHROSCOPY KNEE;  Surgeon: Marcey Raman, MD;  Location: [facility] OR;  Service: Orthopedics;  Laterality: Right;   LABIOPLASTY Bilateral 10/04/2016   Procedure: LABIAL REDUCTION;  Surgeon: Rosaline Cobble, MD;  Location: [facility] ORS;  Service: Gynecology;  Laterality: Bilateral;   TEE WITHOUT CARDIOVERSION N/A 02/08/2017   Procedure: TRANSESOPHAGEAL ECHOCARDIOGRAM (TEE);  Surgeon: Jerel Balding, MD;  Location: [facility] ENDOSCOPY;  Service: Cardiovascular;  Laterality: N/A;   VALVE REPLACEMENT  04/2017 [facility]   Patient Active  Problem List   Diagnosis Date Noted   Acute medial meniscus tear of right knee 06/20/2023   Other tear of medial meniscus, current injury, left knee, subsequent encounter 06/20/2023   Heart disease 05/09/2021   Encounter for care of pacemaker 09/27/2019   Piriformis syndrome of left side 06/16/2019   H/O thoracic aortic aneurysm repair 02/14/2019   Other cytomegaloviral diseases (HCC) 02/14/2019   Vitamin D deficiency 02/14/2019   Degenerative arthritis of right knee 05/14/2018   Nontraumatic neck pain 10/25/2017   Nonallopathic lesion of cervical region 10/25/2017   Nonallopathic lesion of rib cage 10/25/2017   Hx of Lyme disease 07/15/2017   Pacemaker Medtronic 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 04/30/2017 06/24/2017   Atrial flutter (HCC) 05/02/2017   Transition of care performed with sharing of clinical summary 04/26/2017   CHB (complete heart block) (HCC) 04/24/2017   PFO (patent foramen ovale) 03/13/2017   Prosthetic aortic valve stenosis    Nonallopathic lesion of lumbosacral region 11/20/2016   Nonallopathic lesion of sacral region 11/20/2016   Nonallopathic lesion of thoracic region 11/20/2016   Left lumbar radiculopathy 11/13/2016   Left knee injury 03/29/2016   S/P arthroscopy of knee 11/02/2014   Mid sternal chest pain 03/31/2014   Aortic regurgitation 03/31/2014   S/P aortic valve replacement with bioprosthetic valve 03/31/2014   Knee joint pain 10/20/2013  Vitiligo 08/09/2003    PCP: Jacques Camie Pepper, PA-C   REFERRING PROVIDER: Viann Jayson Raddle., MD  REFERRING DIAG: sTATUSW POST RIGHT KNEE  THERAPY DIAG:  Acute pain of right knee  Other abnormalities of gait and mobility  Muscle weakness (generalized)  Rationale for Evaluation and Treatment: Rehabilitation  ONSET DATE: 09/29/24 DOS  SUBJECTIVE:   SUBJECTIVE STATEMENT: 11/23/2024 after the last session I had more soreness/ pain,   EVAL: Presents to OPPT following R TKA following long  standing history of R knee pain and dysfunction and unsuccessful conservative measures.  Patient focused on pain level with reluctance to rely on pain meds for fear of reliance on them.    PERTINENT HISTORY: None available PAIN:  Are you having pain? Yes: NPRS scale: 1/10 Pain location: R knee Pain description: ache, throb, sharp Aggravating factors: movement, activity Relieving factors: rest, meds  PRECAUTIONS: None for knee, pacemaker  RED FLAGS: None   WEIGHT BEARING RESTRICTIONS: No  FALLS:  Has patient fallen in last 6 months? No  LIVING ENVIRONMENT: Lives with: lives with their family Lives in: House/apartment Stairs: 2 Has following equipment at home: Environmental Consultant - 2 wheeled  OCCUPATION: not working  PLOF: Independent  PATIENT GOALS: To regain my knee function  NEXT MD VISIT: 10/13/24  OBJECTIVE:  Note: Objective measures were completed at Evaluation unless otherwise noted.  DIAGNOSTIC FINDINGS: none  PATIENT SURVEYS:  LEFS    MUSCLE LENGTH: N/T  POSTURE: flexed R knee  10/21/24 PALPATION: Good patellar mobility, no redness/warmth and minimal swelling/edema  LOWER EXTREMITY ROM:  A/PROM Right eval Left eval RT 10/21/24 RT 10/26/24 RT 10/28/24 RT 11/02/24 RT 11/04/24 RT 11/09/24 RT 11/12/24 RT 11/18/2024  Hip flexion            Hip extension            Hip abduction            Hip adduction            Hip internal rotation            Hip external rotation            Knee flexion 55/60d  AAROM 80d 72 A tx  88 A, 95 A 100 AAROM 100 A, 110A 112 116/120 118  Knee extension -16/-6d  AROM 10d lacking 7    5 5    Ankle dorsiflexion            Ankle plantarflexion            Ankle inversion            Ankle eversion             (Blank rows = not tested)  LOWER EXTREMITY MMT:  MMT Right eval Left eval  Hip flexion    Hip extension    Hip abduction    Hip adduction    Hip internal rotation    Hip external rotation    Knee flexion 3/5    Knee extension 3/5   Ankle dorsiflexion    Ankle plantarflexion    Ankle inversion    Ankle eversion     (Blank rows = not tested)  LOWER EXTREMITY SPECIAL TESTS:  Deferred post-op  FUNCTIONAL TESTS:  30 seconds chair stand test 1 with UE assist  GAIT: Distance walked: 12ftx2 Assistive device utilized: Walker - 2 wheeled Level of assistance: Modified independence Comments: slow cadence, step through pattern  TREATMENT:  [facility] Adult PT Treatment:                                                DATE: 11/23/24 Recumbent bike x 8 min lowering seat at 4 min  MTPR and the vastus lateralis and glute med Step up/ down on 6 inch step forward/backward x 10, then stepping up and over focusing on the down 1 x 5 Low load long duration hamstring stretch during manual techniques Seated hamstring STW along distal semi-membranosus Reviewed gait and impact of limited knee extension at heel strike and early flexion limiting stride toe off.  Game ready medium pressure x 10 min   [facility] Adult PT Treatment:                                                DATE: 11/18/24 Recumbent bike L 3 x 6 min lowering seat every 2 min to promote ROM Standing quat holding onto freemotion 1 x 10 Standing hip abduction/ extension bil 1 x 12 with RTB LAQ 1 x 10 with 10# bil. 1 x 5 con bil/ ecc RLE Sit to stand with LLE advanced 3 inches.  MTPR along the hamstring with tennis ball Tack and stretch along the hamstring with tennis ball Reviewed and updated HEP.   [facility] Adult PT Treatment:                                                DATE: 11/12/2024 Tibial ER mob with knee extension grade III Prone knee flexion stretch 2 x 60 sec Prone hamstring curl with hip extension 1  x 12 3#, prone hip extension 1 x 10 3# Standing foot in chair forward rocking 2 x 10 Standing marching in place focusing on  control 1 x 25 Recumbent bike l1 x 8 min lowering seat ever 2 min  to promote knee flexion Gait training without SPC heel strike/ toe off.  [facility] Adult PT Treatment:                                                DATE: 11/09/2024 Recumbent bike L1 x 6 min lower seat every 2 min  TKE with ball against the wall  MTPR along the rectus femoris and vastus lateralis IASTM along the quad and patellar tendon Sidelying hip abduction 1 x  going to fatigue Stair training up/ down 6 in ch steps with SPC x 8 cues for proper form   PATIENT EDUCATION:  Education details: Discussed eval findings, rehab rationale and POC and patient is in agreement  Person educated: Patient and Spouse Education method: Explanation and Handouts Education comprehension: verbalized understanding and needs further education  HOME EXERCISE PROGRAM: Access Code: [email or web address] URL: [email or web address] Date: 11/18/2024 Prepared by: Joneen Fresh  Program Notes forward / backward weight shifting with the right foot advanced forward. when rocking forward put weight on the right leg, when rocking backward rock on to the left leg and  pull the toes up on the right foot and tighten your quad and straighten your knee.seated on high table/ surface, swinging the right and left knee. Swing R knee forward while left knee goes back and vice versa.   Exercises - Supine Quad Set  - 5 x daily - 5 x weekly - 1 sets - 10-15 reps - Active Straight Leg Raise with Quad Set  - 5 x daily - 7 x weekly - 1 sets - 10 reps - 3 hold - Supine Knee Extension Strengthening  - 5 x daily - 7 x weekly - 1 sets - 10 reps - 3 hold - Supine Heel Slide with Strap  - 5 x daily - 5 x weekly - 1 sets - 10-15 reps - Seated Knee Flexion Slide  - 5 x daily - 5 x weekly - 1 sets - 10-15 reps - Seated Hamstring Curl with Anchored Resistance (Mirrored)  - 1 x daily - 7 x weekly - 3 sets - 10 reps - Sit to Stand with Hands on Knees  - 1 x daily - 7 x weekly  - 3 sets - 12 reps - Sidelying Hip Abduction  - 1 x daily - 7 x weekly - 3 sets - 15-20 reps - Standing Hip Abduction with Resistance at Ankles and Counter Support  - 1 x daily - 7 x weekly - 2 sets - 10-15 reps - Standing Hip Extension with Resistance at Ankles and Counter Support  - 1 x daily - 7 x weekly - 2 sets - 10-15 reps - Seated Hamstring Stretch  - 2-3 x daily - 7 x weekly - 1-2 sets - 2 reps - 30 hold  ASSESSMENT:  CLINICAL IMPRESSION: 11/23/2024 Kayla Archer arrives to session noting increased pain following the last session as a result of likely dealing with DOMS of the quad which improved about 4days after the session. Continued working on quad / glute strength with functional stepping up/ over a 6 inch step which she did well noting some soreness located at the distal lateral knee along ITB distribution. Utilized game ready today to reduce pain and inflammation following session which she noted improvement with.   EVAL: Patient is a 58 y.o. female who was seen today for physical therapy evaluation and treatment following R TKA.   Patient is approximately 2 weeks postop with Aquacel dressing in place without signs of redness drainage or excessive warmth noted at knee and around dressing edges.  Patient ambulates with a rolling walker using a step through gait with slow cadence and decreased step length bilaterally.  She is able to transfer out of a chair independently but requires upper extremity assist to place right lower extremity onto table.  Patient is extremely fearful of knee flexion due to pain levels.  Patient is able to demonstrate good quad control and facilitation with extension range of motion improving throughout session.  Knee flexion remain patient's main limitation as she was apprehensive about pain.  Patient is a good candidate for outpatient rehab to regain right knee strength and function.  She has a 2-week postop follow-up with her surgeons office and has been recommended  to discuss optimum pain relieving techniques to allow more comfortable participation in rehab.  OBJECTIVE IMPAIRMENTS: Abnormal gait, decreased activity tolerance, decreased balance, decreased coordination, decreased endurance, decreased knowledge of condition, decreased mobility, difficulty walking, decreased ROM, decreased strength, increased fascial restrictions, impaired perceived functional ability, improper body mechanics, and pain.   ACTIVITY LIMITATIONS: carrying, lifting,  sitting, standing, squatting, stairs, transfers, bed mobility, toileting, and locomotion level  PERSONAL FACTORS: Behavior pattern, Fitness, and Past/current experiences are also affecting patient's functional outcome.   REHAB POTENTIAL: Good  CLINICAL DECISION MAKING: Stable/uncomplicated  EVALUATION COMPLEXITY: Low   GOALS: Goals reviewed with patient? No  SHORT TERM GOALS: Target date: 11/09/2024   Patient to demonstrate independence in HEP  Baseline: [email or web address] Goal status: MET   2.  261ft ambulation with LRAD and appropriate gait pattern Baseline: 71ft with RW and step through pattern Goal status: MET  3.  90d AROM knee flexion Baseline: 55d Goal status: MET   LONG TERM GOALS: Target date: 12/07/2024    Patient will acknowledge 4/10 pain at least once during episode of care   Baseline: 8/10 Goal status: INITIAL  2.  Increase AROM of R knee to 115d flexion -5d extension Baseline:  A/PROM Right eval Left eval  Hip flexion    Hip extension    Hip abduction    Hip adduction    Hip internal rotation    Hip external rotation    Knee flexion 55/60d   Knee extension -16/-6d    Goal status: INITIAL  3.  Increase R knee strength to 4/5 Baseline:  MMT Right eval Left eval  Hip flexion    Hip extension    Hip abduction    Hip adduction    Hip internal rotation    Hip external rotation    Knee flexion 3/5   Knee extension 3/5    Goal status: INITIAL  4.  Patient to negotiate 16  stairs with most appropriate pattern and assist. Baseline: TBD Goal status: INITIAL  5.  Patient will score at least 60/80 on LEFS to signify clinically meaningful improvement in functional abilities.   Baseline: 10/80 Goal status: INITIAL     PLAN:  PT FREQUENCY: 1-2x/week  PT DURATION: 8 weeks  PLANNED INTERVENTIONS: 97110-Therapeutic exercises, 97530- Therapeutic activity, 97112- Neuromuscular re-education, 97535- Self Care, [id number]- Manual therapy, [phone]- Gait training, Patient/Family education, Balance training, and Stair training  PLAN FOR NEXT SESSION: HEP review and update, manual techniques as appropriate, aerobic tasks, ROM and flexibility activities, strengthening and PREs, TPDN, gait and balance training,aquatic therapy,   Nilaya Bouie PT, DPT, LAT, ATC  11/23/2024  12:09 PM

## 2024-11-25 ENCOUNTER — Ambulatory Visit: Admitting: Physical Therapy

## 2024-11-25 DIAGNOSIS — R2689 Other abnormalities of gait and mobility: Secondary | ICD-10-CM

## 2024-11-25 DIAGNOSIS — M25561 Pain in right knee: Secondary | ICD-10-CM | POA: Diagnosis not present

## 2024-11-25 DIAGNOSIS — M6281 Muscle weakness (generalized): Secondary | ICD-10-CM

## 2024-11-25 DIAGNOSIS — Z96651 Presence of right artificial knee joint: Secondary | ICD-10-CM

## 2024-11-25 NOTE — Therapy (Signed)
 OUTPATIENT PHYSICAL THERAPY TREATMENT  Patient Name: Veronika Heard MRN: [id number] DOB:1965/12/13, 58 y.o., female Today's Date: 11/25/2024  END OF SESSION:  PT End of Session - 11/25/24 1501     Visit Number 11    Number of Visits 16    Date for Recertification  12/12/24    Authorization Type BC[employer]    PT Start Time 1455    PT Stop Time 1545    PT Time Calculation (min) 50 min    Equipment Utilized During Treatment --    Activity Tolerance Patient tolerated treatment well    Behavior During Therapy WFL for tasks assessed/performed                    Past Medical History:  Diagnosis Date   Cartilage tear    rt knee   CHB (complete heart block) (HCC) 04/24/2017   Overview:  History: Post-op  Assessment: CHB with escape rate 50-60's. S/p PPM placement 05.22.2018.   PMW intact. On low-dose BB.   Plan: Continue low dose BB at discharge. Follow EP recommendations at discharge.   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 04/30/2017 06/24/2017   PONV (postoperative nausea and vomiting)    plus hypotension   Past Surgical History:  Procedure Laterality Date   CARDIAC VALVE REPLACEMENT  04   thoracic aortic aneurysm plus valve   INSERT / REPLACE / REMOVE PACEMAKER     at [facility] 04/2017 for complete heart block   KNEE ARTHROSCOPY Right 12/28/2013   Procedure: ARTHROSCOPY KNEE;  Surgeon: Marcey Raman, MD;  Location: [facility] OR;  Service: Orthopedics;  Laterality: Right;   LABIOPLASTY Bilateral 10/04/2016   Procedure: LABIAL REDUCTION;  Surgeon: Rosaline Cobble, MD;  Location: [facility] ORS;  Service: Gynecology;  Laterality: Bilateral;   TEE WITHOUT CARDIOVERSION N/A 02/08/2017   Procedure: TRANSESOPHAGEAL ECHOCARDIOGRAM (TEE);  Surgeon: Jerel Balding, MD;  Location: [facility] ENDOSCOPY;  Service: Cardiovascular;  Laterality: N/A;   VALVE REPLACEMENT   04/2017 [facility]   Patient Active Problem List   Diagnosis Date Noted   Acute medial meniscus tear of right knee 06/20/2023   Other tear of medial meniscus, current injury, left knee, subsequent encounter 06/20/2023   Heart disease 05/09/2021   Encounter for care of pacemaker 09/27/2019   Piriformis syndrome of left side 06/16/2019   H/O thoracic aortic aneurysm repair 02/14/2019   Other cytomegaloviral diseases (HCC) 02/14/2019   Vitamin D deficiency 02/14/2019   Degenerative arthritis of right knee 05/14/2018   Nontraumatic neck pain 10/25/2017   Nonallopathic lesion of cervical region 10/25/2017   Nonallopathic lesion of rib cage 10/25/2017   Hx of Lyme disease 07/15/2017   Pacemaker Medtronic 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 04/30/2017 06/24/2017   Atrial flutter (HCC) 05/02/2017   Transition of care performed with sharing of clinical summary 04/26/2017   CHB (complete heart block) (HCC) 04/24/2017   PFO (patent foramen ovale) 03/13/2017   Prosthetic aortic valve stenosis    Nonallopathic lesion of lumbosacral region 11/20/2016   Nonallopathic lesion of sacral region 11/20/2016   Nonallopathic lesion of thoracic region 11/20/2016   Left lumbar radiculopathy 11/13/2016   Left knee injury 03/29/2016   S/P arthroscopy of knee 11/02/2014   Mid sternal chest pain 03/31/2014   Aortic regurgitation 03/31/2014   S/P aortic valve replacement with bioprosthetic  valve 03/31/2014   Knee joint pain 10/20/2013   Vitiligo 08/09/2003    PCP: Jacques Camie Pepper, PA-C   REFERRING PROVIDER: Viann Jayson Raddle., MD  REFERRING DIAG: sTATUSW POST RIGHT KNEE  THERAPY DIAG:  Acute pain of right knee  Other abnormalities of gait and mobility  Muscle weakness (generalized)  History of arthroplasty of right knee  Rationale for Evaluation and Treatment: Rehabilitation  ONSET DATE: 09/29/24 DOS  SUBJECTIVE:   SUBJECTIVE STATEMENT: 11/25/2024 Pt states that she was able to  complete reciprocal steps at home without realizing it and was able to complete x2 more sets before coming to therapy today. Notes 3/10 pain.   EVAL: Presents to OPPT following R TKA following long standing history of R knee pain and dysfunction and unsuccessful conservative measures.  Patient focused on pain level with reluctance to rely on pain meds for fear of reliance on them.    PERTINENT HISTORY: None available PAIN:  Are you having pain? Yes: NPRS scale: 1/10 Pain location: R knee Pain description: ache, throb, sharp Aggravating factors: movement, activity Relieving factors: rest, meds  PRECAUTIONS: None for knee, pacemaker  RED FLAGS: None   WEIGHT BEARING RESTRICTIONS: No  FALLS:  Has patient fallen in last 6 months? No  LIVING ENVIRONMENT: Lives with: lives with their family Lives in: House/apartment Stairs: 2 Has following equipment at home: Environmental Consultant - 2 wheeled  OCCUPATION: not working  PLOF: Independent  PATIENT GOALS: To regain my knee function  NEXT MD VISIT: 10/13/24  OBJECTIVE:  Note: Objective measures were completed at Evaluation unless otherwise noted.  DIAGNOSTIC FINDINGS: none  PATIENT SURVEYS:  LEFS    MUSCLE LENGTH: N/T  POSTURE: flexed R knee  10/21/24 PALPATION: Good patellar mobility, no redness/warmth and minimal swelling/edema  LOWER EXTREMITY ROM:  A/PROM Right eval Left eval RT 10/21/24 RT 10/26/24 RT 10/28/24 RT 11/02/24 RT 11/04/24 RT 11/09/24 RT 11/12/24 RT 11/18/2024  Hip flexion            Hip extension            Hip abduction            Hip adduction            Hip internal rotation            Hip external rotation            Knee flexion 55/60d  AAROM 80d 72 A tx  88 A, 95 A 100 AAROM 100 A, 110A 112 116/120 118  Knee extension -16/-6d  AROM 10d lacking 7    5 5    Ankle dorsiflexion            Ankle plantarflexion            Ankle inversion            Ankle eversion             (Blank rows = not  tested)  LOWER EXTREMITY MMT:  MMT Right eval Left eval  Hip flexion    Hip extension    Hip abduction    Hip adduction    Hip internal rotation    Hip external rotation    Knee flexion 3/5   Knee extension 3/5   Ankle dorsiflexion    Ankle plantarflexion    Ankle inversion    Ankle eversion     (Blank rows = not tested)  LOWER EXTREMITY SPECIAL TESTS:  Deferred post-op  FUNCTIONAL TESTS:  30 seconds  chair stand test 1 with UE assist  GAIT: Distance walked: 38ftx2 Assistive device utilized: Environmental Consultant - 2 wheeled Level of assistance: Modified independence Comments: slow cadence, step through pattern                                                                                                                           TREATMENT:  [facility] Adult PT Treatment:                                                DATE: 11/25/24 Therapeutic Exercise: Recumbent bike x6 mins level 1 with full cycles from initiation SLR x25 RLE, extension lag noted at rep 22 Knee flexion extension in sitting on physioball x30 RLE Standing knee flexion x5 sec hold x10 reps 3 way hip in standing x10 reps HEP revised and provided   [facility] Adult PT Treatment:                                                DATE: 11/23/24 Recumbent bike x 8 min lowering seat at 4 min  MTPR and the vastus lateralis and glute med Step up/ down on 6 inch step forward/backward x 10, then stepping up and over focusing on the down 1 x 5 Low load long duration hamstring stretch during manual techniques Seated hamstring STW along distal semi-membranosus Reviewed gait and impact of limited knee extension at heel strike and early flexion limiting stride toe off.  Game ready medium pressure x 10 min   [facility] Adult PT Treatment:                                                DATE: 11/18/24 Recumbent bike L 3 x 6 min lowering seat every 2 min to promote ROM Standing quat holding onto freemotion 1 x 10 Standing hip abduction/ extension  bil 1 x 12 with RTB LAQ 1 x 10 with 10# bil. 1 x 5 con bil/ ecc RLE Sit to stand with LLE advanced 3 inches.  MTPR along the hamstring with tennis ball Tack and stretch along the hamstring with tennis ball Reviewed and updated HEP.   [facility] Adult PT Treatment:                                                DATE: 11/12/2024 Tibial ER mob with knee extension grade III Prone knee flexion stretch 2 x 60 sec Prone hamstring curl  with hip extension 1  x 12 3#, prone hip extension 1 x 10 3# Standing foot in chair forward rocking 2 x 10 Standing marching in place focusing on control 1 x 25 Recumbent bike l1 x 8 min lowering seat ever 2 min  to promote knee flexion Gait training without SPC heel strike/ toe off.  [facility] Adult PT Treatment:                                                DATE: 11/09/2024 Recumbent bike L1 x 6 min lower seat every 2 min  TKE with ball against the wall  MTPR along the rectus femoris and vastus lateralis IASTM along the quad and patellar tendon Sidelying hip abduction 1 x  going to fatigue Stair training up/ down 6 in ch steps with SPC x 8 cues for proper form   PATIENT EDUCATION:  Education details: Discussed eval findings, rehab rationale and POC and patient is in agreement  Person educated: Patient and Spouse Education method: Explanation and Handouts Education comprehension: verbalized understanding and needs further education  HOME EXERCISE PROGRAM: Access Code: [email or web address] URL: [email or web address] Date: 11/25/2024 Prepared by: Stann Ohara  Program Notes forward / backward weight shifting with the right foot advanced forward. when rocking forward put weight on the right leg, when rocking backward rock on to the left leg and pull the toes up on the right foot and tighten your quad and straighten your knee.seated on high table/ surface, swinging the right and left knee. Swing R knee forward while left knee goes back and vice versa.   Exercises -  Active Straight Leg Raise with Quad Set  - 1 x daily - 4 x weekly - 2 sets - 15 reps - 3 hold - Sit to Stand with Hands on Knees  - 1 x daily - 4 x weekly - 3 sets - 12 reps - Standing 3-way Hip with Walker  - 1 x daily - 4 x weekly - 1 sets - 10 reps - 2 hold - Heel Raises with Counter Support  - 1 x daily - 4 x weekly - 3 sets - 15 reps - 2 hold - Standing Knee Flexion with Counter Support  - 1 x daily - 4 x weekly - 2 sets - 10 reps - 5 sec hold ASSESSMENT:  CLINICAL IMPRESSION:  11/25/2024 Pt has been responding well to her current regimen and was able to complete steps earlier today and tolerated session well. Pt able to progress, consolidate, and modify HEP.  Continue to progress activity as tolerated. Pt stands to benefit from continued skilled physical therapy to address deficit areas and restore safety with activities and participations at home and in the community.    EVAL: Patient is a 58 y.o. female who was seen today for physical therapy evaluation and treatment following R TKA.   Patient is approximately 2 weeks postop with Aquacel dressing in place without signs of redness drainage or excessive warmth noted at knee and around dressing edges.  Patient ambulates with a rolling walker using a step through gait with slow cadence and decreased step length bilaterally.  She is able to transfer out of a chair independently but requires upper extremity assist to place right lower extremity onto table.  Patient is extremely fearful of knee flexion due to pain levels.  Patient is able  to demonstrate good quad control and facilitation with extension range of motion improving throughout session.  Knee flexion remain patient's main limitation as she was apprehensive about pain.  Patient is a good candidate for outpatient rehab to regain right knee strength and function.  She has a 2-week postop follow-up with her surgeons office and has been recommended to discuss optimum pain relieving techniques to  allow more comfortable participation in rehab.  OBJECTIVE IMPAIRMENTS: Abnormal gait, decreased activity tolerance, decreased balance, decreased coordination, decreased endurance, decreased knowledge of condition, decreased mobility, difficulty walking, decreased ROM, decreased strength, increased fascial restrictions, impaired perceived functional ability, improper body mechanics, and pain.   ACTIVITY LIMITATIONS: carrying, lifting, sitting, standing, squatting, stairs, transfers, bed mobility, toileting, and locomotion level  PERSONAL FACTORS: Behavior pattern, Fitness, and Past/current experiences are also affecting patient's functional outcome.   REHAB POTENTIAL: Good  CLINICAL DECISION MAKING: Stable/uncomplicated  EVALUATION COMPLEXITY: Low   GOALS: Goals reviewed with patient? No  SHORT TERM GOALS: Target date: 11/09/2024   Patient to demonstrate independence in HEP  Baseline: [id number] Goal status: MET   2.  278ft ambulation with LRAD and appropriate gait pattern Baseline: 54ft with RW and step through pattern Goal status: MET  3.  90d AROM knee flexion Baseline: 55d Goal status: MET   LONG TERM GOALS: Target date: 12/07/2024    Patient will acknowledge 4/10 pain at least once during episode of care   Baseline: 8/10 Goal status: INITIAL  2.  Increase AROM of R knee to 115d flexion -5d extension Baseline:  A/PROM Right eval Left eval  Hip flexion    Hip extension    Hip abduction    Hip adduction    Hip internal rotation    Hip external rotation    Knee flexion 55/60d   Knee extension -16/-6d    Goal status: INITIAL  3.  Increase R knee strength to 4/5 Baseline:  MMT Right eval Left eval  Hip flexion    Hip extension    Hip abduction    Hip adduction    Hip internal rotation    Hip external rotation    Knee flexion 3/5   Knee extension 3/5    Goal status: INITIAL  4.  Patient to negotiate 16 stairs with most appropriate pattern and  assist. Baseline: TBD Goal status: INITIAL  5.  Patient will score at least 60/80 on LEFS to signify clinically meaningful improvement in functional abilities.   Baseline: 10/80 Goal status: INITIAL     PLAN:  PT FREQUENCY: 1-2x/week  PT DURATION: 8 weeks  PLANNED INTERVENTIONS: 97110-Therapeutic exercises, 97530- Therapeutic activity, 97112- Neuromuscular re-education, 97535- Self Care, [id number]- Manual therapy, [phone]- Gait training, Patient/Family education, Balance training, and Stair training  PLAN FOR NEXT SESSION: HEP review and update, manual techniques as appropriate, aerobic tasks, ROM and flexibility activities, strengthening and PREs, TPDN, gait and balance training,aquatic therapy,   Stann Ohara PT, DPT, CLT, CES 11/25/2024 4:19 PM

## 2024-11-27 ENCOUNTER — Ambulatory Visit: Payer: Self-pay | Admitting: Cardiology

## 2024-11-30 ENCOUNTER — Ambulatory Visit: Admitting: Physical Therapy

## 2024-11-30 ENCOUNTER — Encounter: Payer: Self-pay | Admitting: Physical Therapy

## 2024-11-30 DIAGNOSIS — M25561 Pain in right knee: Secondary | ICD-10-CM | POA: Diagnosis not present

## 2024-11-30 DIAGNOSIS — M6281 Muscle weakness (generalized): Secondary | ICD-10-CM

## 2024-11-30 DIAGNOSIS — R2689 Other abnormalities of gait and mobility: Secondary | ICD-10-CM

## 2024-11-30 NOTE — Therapy (Signed)
 " OUTPATIENT PHYSICAL THERAPY TREATMENT  Patient Name: Kayla Archer MRN: [id number] DOB:[birth year], 58 y.o., female Today's Date: 11/30/2024  END OF SESSION:  PT End of Session - 11/30/24 1012     Visit Number 12    Number of Visits 16    Date for Recertification  12/12/24    Authorization Type BCBS    PT Start Time 1012    Activity Tolerance Patient tolerated treatment well    Behavior During Therapy [facility] for tasks assessed/performed                     Past Medical History:  Diagnosis Date   Cartilage tear    rt knee   CHB (complete heart block) (HCC) 04/24/2017   Overview:  History: Post-op  Assessment: CHB with escape rate 50-60's. S/p PPM placement 05.22.2018.   PMW intact. On low-dose BB.   Plan: Continue low dose BB at discharge. Follow EP recommendations at discharge.   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 04/30/2017 06/24/2017   PONV (postoperative nausea and vomiting)    plus hypotension   Past Surgical History:  Procedure Laterality Date   CARDIAC VALVE REPLACEMENT  04   thoracic aortic aneurysm plus valve   INSERT / REPLACE / REMOVE PACEMAKER     at [facility] 04/2017 for complete heart block   KNEE ARTHROSCOPY Right 12/28/2013   Procedure: ARTHROSCOPY KNEE;  Surgeon: Marcey Raman, MD;  Location: [facility] OR;  Service: Orthopedics;  Laterality: Right;   LABIOPLASTY Bilateral 10/04/2016   Procedure: LABIAL REDUCTION;  Surgeon: Rosaline Cobble, MD;  Location: [facility] ORS;  Service: Gynecology;  Laterality: Bilateral;   TEE WITHOUT CARDIOVERSION N/A 02/08/2017   Procedure: TRANSESOPHAGEAL ECHOCARDIOGRAM (TEE);  Surgeon: Jerel Balding, MD;  Location: [facility] ENDOSCOPY;  Service: Cardiovascular;  Laterality: N/A;   VALVE REPLACEMENT  04/2017 [facility]   Patient Active Problem List   Diagnosis Date Noted   Acute medial meniscus  tear of right knee 06/20/2023   Other tear of medial meniscus, current injury, left knee, subsequent encounter 06/20/2023   Heart disease 05/09/2021   Encounter for care of pacemaker 09/27/2019   Piriformis syndrome of left side 06/16/2019   H/O thoracic aortic aneurysm repair 02/14/2019   Other cytomegaloviral diseases (HCC) 02/14/2019   Vitamin D deficiency 02/14/2019   Degenerative arthritis of right knee 05/14/2018   Nontraumatic neck pain 10/25/2017   Nonallopathic lesion of cervical region 10/25/2017   Nonallopathic lesion of rib cage 10/25/2017   Hx of Lyme disease 07/15/2017   Pacemaker Medtronic 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 04/30/2017 06/24/2017   Atrial flutter (HCC) 05/02/2017   Transition of care performed with sharing of clinical summary 04/26/2017   CHB (complete heart block) (HCC) 04/24/2017   PFO (patent foramen ovale) 03/13/2017   Prosthetic aortic valve stenosis    Nonallopathic lesion of lumbosacral region 11/20/2016   Nonallopathic lesion of sacral region 11/20/2016   Nonallopathic lesion of thoracic region 11/20/2016   Left lumbar radiculopathy 11/13/2016   Left knee injury 03/29/2016   S/P arthroscopy of knee 11/02/2014   Mid sternal chest pain 03/31/2014   Aortic regurgitation 03/31/2014   S/P aortic valve replacement with bioprosthetic valve 03/31/2014   Knee joint pain 10/20/2013   Vitiligo 08/09/2003    PCP: Jacques Camie Pepper, PA-C  REFERRING PROVIDER: Viann Jayson Raddle., MD  REFERRING DIAG: sTATUSW POST RIGHT KNEE  THERAPY DIAG:  Acute pain of right knee  Other abnormalities of gait and mobility  Muscle weakness (generalized)  Rationale for Evaluation and Treatment: Rehabilitation  ONSET DATE: 09/29/24 DOS  SUBJECTIVE:   SUBJECTIVE STATEMENT: 11/30/2024 I am doing pretty good, I am being ambitious so today is 1/10.  EVAL: Presents to OPPT following R TKA following long standing history of R knee pain and dysfunction and  unsuccessful conservative measures.  Patient focused on pain level with reluctance to rely on pain meds for fear of reliance on them.    PERTINENT HISTORY: None available PAIN:  Are you having pain? Yes: NPRS scale: 1/10 Pain location: R knee Pain description: ache, throb, sharp Aggravating factors: movement, activity Relieving factors: rest, meds  PRECAUTIONS: None for knee, pacemaker  RED FLAGS: None   WEIGHT BEARING RESTRICTIONS: No  FALLS:  Has patient fallen in last 6 months? No  LIVING ENVIRONMENT: Lives with: lives with their family Lives in: House/apartment Stairs: 2 Has following equipment at home: Environmental Consultant - 2 wheeled  OCCUPATION: not working  PLOF: Independent  PATIENT GOALS: To regain my knee function  NEXT MD VISIT: 10/13/24  OBJECTIVE:  Note: Objective measures were completed at Evaluation unless otherwise noted.  DIAGNOSTIC FINDINGS: none  PATIENT SURVEYS:  LEFS 39/80   MUSCLE LENGTH: N/T  POSTURE: flexed R knee  10/21/24 PALPATION: Good patellar mobility, no redness/warmth and minimal swelling/edema  LOWER EXTREMITY ROM:  A/PROM Right eval Left eval RT 10/21/24 RT 10/26/24 RT 10/28/24 RT 11/02/24 RT 11/04/24 RT 11/09/24 RT 11/12/24 RT 11/18/2024 RT 11/30/2024  Hip flexion             Hip extension             Hip abduction             Hip adduction             Hip internal rotation             Hip external rotation             Knee flexion 55/60d  AAROM 80d 72 A tx  88 A, 95 A 100 AAROM 100 A, 110A 112 116/120 118 120  Knee extension -16/-6d  AROM 10d lacking 7    5 5     Ankle dorsiflexion             Ankle plantarflexion             Ankle inversion             Ankle eversion              (Blank rows = not tested)  LOWER EXTREMITY MMT:  MMT Right eval Left eval  Hip flexion    Hip extension    Hip abduction    Hip adduction    Hip internal rotation    Hip external rotation    Knee flexion 3/5   Knee extension 3/5    Ankle dorsiflexion    Ankle plantarflexion    Ankle inversion    Ankle eversion     (Blank rows = not tested)  LOWER EXTREMITY SPECIAL TESTS:  Deferred post-op  FUNCTIONAL TESTS:  30 seconds chair stand test 1 with UE assist  GAIT: Distance walked: 31ftx2 Assistive device utilized: Walker - 2 wheeled Level of assistance: Modified independence Comments: slow cadence, step through pattern  TREATMENT:  [facility] Adult PT Treatment:                                                DATE: 11/30/24 Recumbent bike L1 x 4 min ( lowering seat at 2 min ) Elliptical L1, ramp L1, x 4 min  Quadruped lateral rocking, with posterior rocking into childs pose Lunge touching back knee on to bosu 1 x 5 bil  Reviewed todays exercises and doing the elliptical at home.   [facility] Adult PT Treatment:                                                DATE: 11/25/24 Therapeutic Exercise: Recumbent bike x6 mins level 1 with full cycles from initiation SLR x25 RLE, extension lag noted at rep 22 Knee flexion extension in sitting on physioball x30 RLE Standing knee flexion x5 sec hold x10 reps 3 way hip in standing x10 reps HEP revised and provided   [facility] Adult PT Treatment:                                                DATE: 11/23/24 Recumbent bike x 8 min lowering seat at 4 min  MTPR and the vastus lateralis and glute med Step up/ down on 6 inch step forward/backward x 10, then stepping up and over focusing on the down 1 x 5 Low load long duration hamstring stretch during manual techniques Seated hamstring STW along distal semi-membranosus Reviewed gait and impact of limited knee extension at heel strike and early flexion limiting stride toe off.  Game ready medium pressure x 10 min   [facility] Adult PT Treatment:                                                DATE: 11/18/24 Recumbent bike L 3 x  6 min lowering seat every 2 min to promote ROM Standing quat holding onto freemotion 1 x 10 Standing hip abduction/ extension bil 1 x 12 with RTB LAQ 1 x 10 with 10# bil. 1 x 5 con bil/ ecc RLE Sit to stand with LLE advanced 3 inches.  MTPR along the hamstring with tennis ball Tack and stretch along the hamstring with tennis ball Reviewed and updated HEP.   PATIENT EDUCATION:  Education details: Discussed eval findings, rehab rationale and POC and patient is in agreement  Person educated: Patient and Spouse Education method: Explanation and Handouts Education comprehension: verbalized understanding and needs further education  HOME EXERCISE PROGRAM: Access Code: [email or web address] URL: [email or web address] Date: 11/25/2024 Prepared by: Stann Ohara  Program Notes forward / backward weight shifting with the right foot advanced forward. when rocking forward put weight on the right leg, when rocking backward rock on to the left leg and pull the toes up on the right foot and tighten your quad and straighten your knee.seated on high table/ surface, swinging the right and left knee. Swing R knee forward while left knee goes  back and vice versa.   Exercises - Active Straight Leg Raise with Quad Set  - 1 x daily - 4 x weekly - 2 sets - 15 reps - 3 hold - Sit to Stand with Hands on Knees  - 1 x daily - 4 x weekly - 3 sets - 12 reps - Standing 3-way Hip with Walker  - 1 x daily - 4 x weekly - 1 sets - 10 reps - 2 hold - Heel Raises with Counter Support  - 1 x daily - 4 x weekly - 3 sets - 15 reps - 2 hold - Standing Knee Flexion with Counter Support  - 1 x daily - 4 x weekly - 2 sets - 10 reps - 5 sec hold ASSESSMENT:  CLINICAL IMPRESSION:  11/30/2024 Karstyn arrives to PT today noting she was able to take a bath and was careful but was able to get since the last time she was seen. Additionally today she reports pain is at 1/10. She started session at 120, and increased to 125 following  exercises today. She continues to make steady improvement with LEFS improving to 39/80.  plan to continue progressing strengthening, higher level dynamic activities including getting onto/ up from the floor, and balance training. End of session she noted feeling overall good.   EVAL: Patient is a 58 y.o. female who was seen today for physical therapy evaluation and treatment following R TKA.   Patient is approximately 2 weeks postop with Aquacel dressing in place without signs of redness drainage or excessive warmth noted at knee and around dressing edges.  Patient ambulates with a rolling walker using a step through gait with slow cadence and decreased step length bilaterally.  She is able to transfer out of a chair independently but requires upper extremity assist to place right lower extremity onto table.  Patient is extremely fearful of knee flexion due to pain levels.  Patient is able to demonstrate good quad control and facilitation with extension range of motion improving throughout session.  Knee flexion remain patient's main limitation as she was apprehensive about pain.  Patient is a good candidate for outpatient rehab to regain right knee strength and function.  She has a 2-week postop follow-up with her surgeons office and has been recommended to discuss optimum pain relieving techniques to allow more comfortable participation in rehab.  OBJECTIVE IMPAIRMENTS: Abnormal gait, decreased activity tolerance, decreased balance, decreased coordination, decreased endurance, decreased knowledge of condition, decreased mobility, difficulty walking, decreased ROM, decreased strength, increased fascial restrictions, impaired perceived functional ability, improper body mechanics, and pain.   ACTIVITY LIMITATIONS: carrying, lifting, sitting, standing, squatting, stairs, transfers, bed mobility, toileting, and locomotion level  PERSONAL FACTORS: Behavior pattern, Fitness, and Past/current experiences are also  affecting patient's functional outcome.   REHAB POTENTIAL: Good  CLINICAL DECISION MAKING: Stable/uncomplicated  EVALUATION COMPLEXITY: Low   GOALS: Goals reviewed with patient? No  SHORT TERM GOALS: Target date: 11/09/2024   Patient to demonstrate independence in HEP  Baseline: [email or web address] Goal status: MET   2.  237ft ambulation with LRAD and appropriate gait pattern Baseline: 44ft with RW and step through pattern Goal status: MET  3.  90d AROM knee flexion Baseline: 55d Goal status: MET   LONG TERM GOALS: Target date: 12/07/2024    Patient will acknowledge 4/10 pain at least once during episode of care   Baseline: 8/10 Goal status: INITIAL  2.  Increase AROM of R knee to 115d flexion -5d extension Baseline:  A/PROM  Right eval Left eval  Hip flexion    Hip extension    Hip abduction    Hip adduction    Hip internal rotation    Hip external rotation    Knee flexion 55/60d   Knee extension -16/-6d    Goal status: INITIAL  3.  Increase R knee strength to 4/5 Baseline:  MMT Right eval Left eval  Hip flexion    Hip extension    Hip abduction    Hip adduction    Hip internal rotation    Hip external rotation    Knee flexion 3/5   Knee extension 3/5    Goal status: INITIAL  4.  Patient to negotiate 16 stairs with most appropriate pattern and assist. Baseline: TBD Goal status: INITIAL  5.  Patient will score at least 60/80 on LEFS to signify clinically meaningful improvement in functional abilities.   Baseline: 10/80 Goal status: INITIAL     PLAN:  PT FREQUENCY: 1-2x/week  PT DURATION: 8 weeks  PLANNED INTERVENTIONS: 97110-Therapeutic exercises, 97530- Therapeutic activity, 97112- Neuromuscular re-education, 97535- Self Care, [id number]- Manual therapy, [phone]- Gait training, Patient/Family education, Balance training, and Stair training  PLAN FOR NEXT SESSION: HEP review and update, manual techniques as appropriate, aerobic tasks, ROM and  flexibility activities, strengthening and PREs, TPDN, gait and balance training,aquatic therapy,   Stann Ohara PT, DPT, CLT, CES 11/30/2024 12:35 PM           "

## 2024-12-04 ENCOUNTER — Ambulatory Visit: Admitting: Physical Therapy

## 2024-12-04 DIAGNOSIS — Z96651 Presence of right artificial knee joint: Secondary | ICD-10-CM

## 2024-12-04 DIAGNOSIS — R2689 Other abnormalities of gait and mobility: Secondary | ICD-10-CM

## 2024-12-04 DIAGNOSIS — M25561 Pain in right knee: Secondary | ICD-10-CM | POA: Diagnosis not present

## 2024-12-04 DIAGNOSIS — M6281 Muscle weakness (generalized): Secondary | ICD-10-CM

## 2024-12-04 NOTE — Therapy (Signed)
 " OUTPATIENT PHYSICAL THERAPY TREATMENT  Patient Name: Kayla Archer MRN: [id number] DOB:1966/11/06, 58 y.o., female Today's Date: 12/06/2024  END OF SESSION:                Past Medical History:  Diagnosis Date   Cartilage tear    rt knee   CHB (complete heart block) (HCC) 04/24/2017   Overview:  History: Post-op  Assessment: CHB with escape rate 50-60's. S/p PPM placement 05.22.2018.   PMW intact. On low-dose BB.   Plan: Continue low dose BB at discharge. Follow EP recommendations at discharge.   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 04/30/2017 06/24/2017   PONV (postoperative nausea and vomiting)    plus hypotension   Past Surgical History:  Procedure Laterality Date   CARDIAC VALVE REPLACEMENT  04   thoracic aortic aneurysm plus valve   INSERT / REPLACE / REMOVE PACEMAKER     at [facility] 04/2017 for complete heart block   KNEE ARTHROSCOPY Right 12/28/2013   Procedure: ARTHROSCOPY KNEE;  Surgeon: Marcey Raman, MD;  Location: [facility] OR;  Service: Orthopedics;  Laterality: Right;   LABIOPLASTY Bilateral 10/04/2016   Procedure: LABIAL REDUCTION;  Surgeon: Rosaline Cobble, MD;  Location: [facility] ORS;  Service: Gynecology;  Laterality: Bilateral;   TEE WITHOUT CARDIOVERSION N/A 02/08/2017   Procedure: TRANSESOPHAGEAL ECHOCARDIOGRAM (TEE);  Surgeon: Jerel Balding, MD;  Location: [facility] ENDOSCOPY;  Service: Cardiovascular;  Laterality: N/A;   VALVE REPLACEMENT  04/2017 [facility]   Patient Active Problem List   Diagnosis Date Noted   Acute medial meniscus tear of right knee 06/20/2023   Other tear of medial meniscus, current injury, left knee, subsequent encounter 06/20/2023   Heart disease 05/09/2021   Encounter for care of pacemaker 09/27/2019   Piriformis syndrome of left side 06/16/2019   H/O thoracic aortic aneurysm repair 02/14/2019    Other cytomegaloviral diseases (HCC) 02/14/2019   Vitamin D deficiency 02/14/2019   Degenerative arthritis of right knee 05/14/2018   Nontraumatic neck pain 10/25/2017   Nonallopathic lesion of cervical region 10/25/2017   Nonallopathic lesion of rib cage 10/25/2017   Hx of Lyme disease 07/15/2017   Pacemaker Medtronic 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 04/30/2017 06/24/2017   Atrial flutter (HCC) 05/02/2017   Transition of care performed with sharing of clinical summary 04/26/2017   CHB (complete heart block) (HCC) 04/24/2017   PFO (patent foramen ovale) 03/13/2017   Prosthetic aortic valve stenosis    Nonallopathic lesion of lumbosacral region 11/20/2016   Nonallopathic lesion of sacral region 11/20/2016   Nonallopathic lesion of thoracic region 11/20/2016   Left lumbar radiculopathy 11/13/2016   Left knee injury 03/29/2016   S/P arthroscopy of knee 11/02/2014   Mid sternal chest pain 03/31/2014   Aortic regurgitation 03/31/2014   S/P aortic valve replacement with bioprosthetic valve 03/31/2014   Knee joint pain 10/20/2013   Vitiligo 08/09/2003    PCP: Jacques Camie Pepper, PA-C   REFERRING PROVIDER: Viann Jayson Raddle., MD  REFERRING DIAG: sTATUSW POST RIGHT KNEE  THERAPY DIAG:  Acute pain of right knee  Other abnormalities of gait and mobility  Muscle weakness (generalized)  History of arthroplasty of right knee  Rationale for Evaluation and Treatment: Rehabilitation  ONSET DATE: 09/29/24 DOS  SUBJECTIVE:   SUBJECTIVE STATEMENT: 12/06/2024 Pt states that she has 3/10 pain in  her knee today, took 2 tylenol  this morning and NSAIDs at 11:00.    EVAL: Presents to OPPT following R TKA following long standing history of R knee pain and dysfunction and unsuccessful conservative measures.  Patient focused on pain level with reluctance to rely on pain meds for fear of reliance on them.    PERTINENT HISTORY: None available PAIN:  Are you having pain? Yes: NPRS  scale: 1/10 Pain location: R knee Pain description: ache, throb, sharp Aggravating factors: movement, activity Relieving factors: rest, meds  PRECAUTIONS: None for knee, pacemaker  RED FLAGS: None   WEIGHT BEARING RESTRICTIONS: No  FALLS:  Has patient fallen in last 6 months? No  LIVING ENVIRONMENT: Lives with: lives with their family Lives in: House/apartment Stairs: 2 Has following equipment at home: Environmental Consultant - 2 wheeled  OCCUPATION: not working  PLOF: Independent  PATIENT GOALS: To regain my knee function  NEXT MD VISIT: 10/13/24  OBJECTIVE:  Note: Objective measures were completed at Evaluation unless otherwise noted.  DIAGNOSTIC FINDINGS: none  PATIENT SURVEYS:  LEFS 39/80   MUSCLE LENGTH: N/T  POSTURE: flexed R knee  10/21/24 PALPATION: Good patellar mobility, no redness/warmth and minimal swelling/edema  LUMBAR ROM: Flexion: nil loss, L HS tighter than R Extension: 25% loss, central low back pain Side bend L: R sided low back pain Side bend R: nil loss, no symptoms  LOWER EXTREMITY ROM:  A/PROM Right eval Left eval RT 10/21/24 RT 10/26/24 RT 10/28/24 RT 11/02/24 RT 11/04/24 RT 11/09/24 RT 11/12/24 RT 11/18/2024 RT 11/30/2024  Hip flexion             Hip extension             Hip abduction             Hip adduction             Hip internal rotation             Hip external rotation             Knee flexion 55/60d  AAROM 80d 72 A tx  88 A, 95 A 100 AAROM 100 A, 110A 112 116/120 118 120  Knee extension -16/-6d  AROM 10d lacking 7    5 5     Ankle dorsiflexion             Ankle plantarflexion             Ankle inversion             Ankle eversion              (Blank rows = not tested)  LOWER EXTREMITY MMT:  MMT Right eval Left eval  Hip flexion    Hip extension    Hip abduction    Hip adduction    Hip internal rotation    Hip external rotation    Knee flexion 3/5   Knee extension 3/5   Ankle dorsiflexion    Ankle  plantarflexion    Ankle inversion    Ankle eversion     (Blank rows = not tested)  LOWER EXTREMITY SPECIAL TESTS:  Deferred post-op  FUNCTIONAL TESTS:  30 seconds chair stand test 1 with UE assist  GAIT: Distance walked: 61ftx2 Assistive device utilized: Walker - 2 wheeled Level of assistance: Modified independence Comments: slow cadence, step through pattern  TREATMENT:  [facility] Adult PT Treatment:                                                DATE: 12/04/24 Therapeutic Exercise: Lumbar extension in standing x10, no effect  Sustained lumbar extension in prone x3 mins, dec better pain with right left side glide in standing, potentially better in knee, will assess over the weekend.  Recumbent bike x5 mins  Heel raises 2x15 with HHA    [facility] Adult PT Treatment:                                                DATE: 11/30/24 Recumbent bike L1 x 4 min ( lowering seat at 2 min ) Elliptical L1, ramp L1, x 4 min  Quadruped lateral rocking, with posterior rocking into childs pose Lunge touching back knee on to bosu 1 x 5 bil  Reviewed todays exercises and doing the elliptical at home.   [facility] Adult PT Treatment:                                                DATE: 11/25/24 Therapeutic Exercise: Recumbent bike x6 mins level 1 with full cycles from initiation SLR x25 RLE, extension lag noted at rep 22 Knee flexion extension in sitting on physioball x30 RLE Standing knee flexion x5 sec hold x10 reps 3 way hip in standing x10 reps HEP revised and provided   [facility] Adult PT Treatment:                                                DATE: 11/23/24 Recumbent bike x 8 min lowering seat at 4 min  MTPR and the vastus lateralis and glute med Step up/ down on 6 inch step forward/backward x 10, then stepping up and over focusing on the down 1 x 5 Low load long duration hamstring  stretch during manual techniques Seated hamstring STW along distal semi-membranosus Reviewed gait and impact of limited knee extension at heel strike and early flexion limiting stride toe off.  Game ready medium pressure x 10 min   [facility] Adult PT Treatment:                                                DATE: 11/18/24 Recumbent bike L 3 x 6 min lowering seat every 2 min to promote ROM Standing quat holding onto freemotion 1 x 10 Standing hip abduction/ extension bil 1 x 12 with RTB LAQ 1 x 10 with 10# bil. 1 x 5 con bil/ ecc RLE Sit to stand with LLE advanced 3 inches.  MTPR along the hamstring with tennis ball Tack and stretch along the hamstring with tennis ball Reviewed and updated HEP.   PATIENT EDUCATION:  Education details: Discussed eval findings, rehab rationale and POC and patient is  in agreement  Person educated: Patient and Spouse Education method: Explanation and Handouts Education comprehension: verbalized understanding and needs further education  HOME EXERCISE PROGRAM: Access Code: [email or web address] URL: [email or web address] Date: 12/04/2024 Prepared by: Stann Ohara  Program Notes forward / backward weight shifting with the right foot advanced forward. when rocking forward put weight on the right leg, when rocking backward rock on to the left leg and pull the toes up on the right foot and tighten your quad and straighten your knee.seated on high table/ surface, swinging the right and left knee. Swing R knee forward while left knee goes back and vice versa.   Exercises - Active Straight Leg Raise with Quad Set  - 1 x daily - 4 x weekly - 2 sets - 15 reps - 3 hold - Sit to Stand with Hands on Knees  - 1 x daily - 4 x weekly - 3 sets - 12 reps - Standing 3-way Hip with Walker  - 1 x daily - 4 x weekly - 1 sets - 10 reps - 2 hold - Heel Raises with Counter Support  - 1 x daily - 4 x weekly - 3 sets - 15 reps - 2 hold - Standing Knee Flexion with Counter Support  -  1 x daily - 4 x weekly - 2 sets - 10 reps - 5 sec hold - Prone Press Up On Elbows  - 2 x daily - 7 x weekly - 1 sets - 1 reps - 3-5 min hold ASSESSMENT:  CLINICAL IMPRESSION:  12/06/2024 Pt tolerated her session well. Began session discussing pt frustrations over persistent pain in the knee with improving function noted, but pt struggles to have increased times of true 0/10 pain. Based on hx of low back pain, pt agreeable to complete lumbar screen. Pt was able to demonstrate a directional preference of sustained extension principles in the low back, as well as noted dec in R knee pain. Pt to continue this over the weekend and will assess significance at next session. Pt HEP modified and progressed. Pt stands to benefit from continued skilled physical therapy to address deficit areas and restore safety with activities and participations at home and in the community.    EVAL: Patient is a 58 y.o. female who was seen today for physical therapy evaluation and treatment following R TKA.   Patient is approximately 2 weeks postop with Aquacel dressing in place without signs of redness drainage or excessive warmth noted at knee and around dressing edges.  Patient ambulates with a rolling walker using a step through gait with slow cadence and decreased step length bilaterally.  She is able to transfer out of a chair independently but requires upper extremity assist to place right lower extremity onto table.  Patient is extremely fearful of knee flexion due to pain levels.  Patient is able to demonstrate good quad control and facilitation with extension range of motion improving throughout session.  Knee flexion remain patient's main limitation as she was apprehensive about pain.  Patient is a good candidate for outpatient rehab to regain right knee strength and function.  She has a 2-week postop follow-up with her surgeons office and has been recommended to discuss optimum pain relieving techniques to allow more  comfortable participation in rehab.  OBJECTIVE IMPAIRMENTS: Abnormal gait, decreased activity tolerance, decreased balance, decreased coordination, decreased endurance, decreased knowledge of condition, decreased mobility, difficulty walking, decreased ROM, decreased strength, increased fascial restrictions, impaired perceived functional ability, improper body mechanics, and pain.  ACTIVITY LIMITATIONS: carrying, lifting, sitting, standing, squatting, stairs, transfers, bed mobility, toileting, and locomotion level  PERSONAL FACTORS: Behavior pattern, Fitness, and Past/current experiences are also affecting patient's functional outcome.   REHAB POTENTIAL: Good  CLINICAL DECISION MAKING: Stable/uncomplicated  EVALUATION COMPLEXITY: Low   GOALS: Goals reviewed with patient? No  SHORT TERM GOALS: Target date: 11/09/2024   Patient to demonstrate independence in HEP  Baseline: [email or web address] Goal status: MET   2.  271ft ambulation with LRAD and appropriate gait pattern Baseline: 60ft with RW and step through pattern Goal status: MET  3.  90d AROM knee flexion Baseline: 55d Goal status: MET   LONG TERM GOALS: Target date: 12/07/2024    Patient will acknowledge 4/10 pain at least once during episode of care   Baseline: 8/10 Goal status: INITIAL  2.  Increase AROM of R knee to 115d flexion -5d extension Baseline:  A/PROM Right eval Left eval  Hip flexion    Hip extension    Hip abduction    Hip adduction    Hip internal rotation    Hip external rotation    Knee flexion 55/60d   Knee extension -16/-6d    Goal status: INITIAL  3.  Increase R knee strength to 4/5 Baseline:  MMT Right eval Left eval  Hip flexion    Hip extension    Hip abduction    Hip adduction    Hip internal rotation    Hip external rotation    Knee flexion 3/5   Knee extension 3/5    Goal status: INITIAL  4.  Patient to negotiate 16 stairs with most appropriate pattern and assist. Baseline:  TBD Goal status: INITIAL  5.  Patient will score at least 60/80 on LEFS to signify clinically meaningful improvement in functional abilities.   Baseline: 10/80 Goal status: INITIAL     PLAN:  PT FREQUENCY: 1-2x/week  PT DURATION: 8 weeks  PLANNED INTERVENTIONS: 97110-Therapeutic exercises, 97530- Therapeutic activity, 97112- Neuromuscular re-education, 97535- Self Care, [id number]- Manual therapy, [phone]- Gait training, Patient/Family education, Balance training, and Stair training  PLAN FOR NEXT SESSION: HEP review and update, manual techniques as appropriate, aerobic tasks, ROM and flexibility activities, strengthening and PREs, TPDN, gait and balance training,aquatic therapy,   Stann Ohara PT, DPT, CLT, CES 12/06/2024 7:38 PM           "

## 2024-12-09 ENCOUNTER — Encounter: Payer: Self-pay | Admitting: Physical Therapy

## 2024-12-09 ENCOUNTER — Ambulatory Visit: Admitting: Physical Therapy

## 2024-12-09 DIAGNOSIS — R2689 Other abnormalities of gait and mobility: Secondary | ICD-10-CM

## 2024-12-09 DIAGNOSIS — M6281 Muscle weakness (generalized): Secondary | ICD-10-CM

## 2024-12-09 DIAGNOSIS — M25561 Pain in right knee: Secondary | ICD-10-CM

## 2024-12-09 DIAGNOSIS — Z96651 Presence of right artificial knee joint: Secondary | ICD-10-CM

## 2024-12-09 NOTE — Therapy (Signed)
 " OUTPATIENT PHYSICAL THERAPY TREATMENT  Patient Name: Kayla Archer MRN: [id number] DOB:[birth year], 58 y.o., female Today's Date: 12/09/2024  END OF SESSION:  PT End of Session - 12/09/24 0914     Visit Number 14    Number of Visits 16    Date for Recertification  12/12/24    Authorization Type [employer]    PT Start Time 0915    PT Stop Time 1000    PT Time Calculation (min) 45 min    Equipment Utilized During Treatment Gait belt    Activity Tolerance Patient tolerated treatment well    Behavior During Therapy WFL for tasks assessed/performed                       Past Medical History:  Diagnosis Date   Cartilage tear    rt knee   CHB (complete heart block) (HCC) 04/24/2017   Overview:  History: Post-op  Assessment: CHB with escape rate 50-60's. S/p PPM placement 05.22.2018.   PMW intact. On low-dose BB.   Plan: Continue low dose BB at discharge. Follow EP recommendations at discharge.   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 04/30/2017 06/24/2017   PONV (postoperative nausea and vomiting)    plus hypotension   Past Surgical History:  Procedure Laterality Date   CARDIAC VALVE REPLACEMENT  04   thoracic aortic aneurysm plus valve   INSERT / REPLACE / REMOVE PACEMAKER     at [facility] 04/2017 for complete heart block   KNEE ARTHROSCOPY Right 12/28/2013   Procedure: ARTHROSCOPY KNEE;  Surgeon: Marcey Raman, MD;  Location: [facility] OR;  Service: Orthopedics;  Laterality: Right;   LABIOPLASTY Bilateral 10/04/2016   Procedure: LABIAL REDUCTION;  Surgeon: Rosaline Cobble, MD;  Location: [facility];  Service: Gynecology;  Laterality: Bilateral;   TEE WITHOUT CARDIOVERSION N/A 02/08/2017   Procedure: TRANSESOPHAGEAL ECHOCARDIOGRAM (TEE);  Surgeon: Jerel Balding, MD;  Location: [facility] ENDOSCOPY;  Service: Cardiovascular;  Laterality: N/A;   VALVE  REPLACEMENT  04/2017 [facility]   Patient Active Problem List   Diagnosis Date Noted   Acute medial meniscus tear of right knee 06/20/2023   Other tear of medial meniscus, current injury, left knee, subsequent encounter 06/20/2023   Heart disease 05/09/2021   Encounter for care of pacemaker 09/27/2019   Piriformis syndrome of left side 06/16/2019   H/O thoracic aortic aneurysm repair 02/14/2019   Other cytomegaloviral diseases (HCC) 02/14/2019   Vitamin D deficiency 02/14/2019   Degenerative arthritis of right knee 05/14/2018   Nontraumatic neck pain 10/25/2017   Nonallopathic lesion of cervical region 10/25/2017   Nonallopathic lesion of rib cage 10/25/2017   Hx of Lyme disease 07/15/2017   Pacemaker Medtronic 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 04/30/2017 06/24/2017   Atrial flutter (HCC) 05/02/2017   Transition of care performed with sharing of clinical summary 04/26/2017   CHB (complete heart block) (HCC) 04/24/2017   PFO (patent foramen ovale) 03/13/2017   Prosthetic aortic valve stenosis    Nonallopathic lesion of lumbosacral region 11/20/2016   Nonallopathic lesion of sacral region 11/20/2016   Nonallopathic lesion of thoracic region 11/20/2016   Left lumbar radiculopathy 11/13/2016   Left knee injury 03/29/2016   S/P arthroscopy of knee 11/02/2014   Mid sternal chest pain 03/31/2014   Aortic regurgitation 03/31/2014   S/P  aortic valve replacement with bioprosthetic valve 03/31/2014   Knee joint pain 10/20/2013   Vitiligo 08/09/2003    PCP: Jacques Camie Pepper, PA-C   REFERRING PROVIDER: Viann Jayson Raddle., MD  REFERRING DIAG: sTATUSW POST RIGHT KNEE  THERAPY DIAG:  Acute pain of right knee  Other abnormalities of gait and mobility  Muscle weakness (generalized)  History of arthroplasty of right knee  Rationale for Evaluation and Treatment: Rehabilitation  ONSET DATE: 09/29/24 DOS  SUBJECTIVE:   SUBJECTIVE STATEMENT: 12/09/2024 Pt states that  she has been having inc sciatic nn pain today, has been compliant with HEP. Notes that she has been actively holding herself in ext x4-5 mins when completing sustained lumbar extension.   EVAL: Presents to OPPT following R TKA following long standing history of R knee pain and dysfunction and unsuccessful conservative measures.  Patient focused on pain level with reluctance to rely on pain meds for fear of reliance on them.    PERTINENT HISTORY: None available PAIN:  Are you having pain? Yes: NPRS scale: 1/10 Pain location: R knee Pain description: ache, throb, sharp Aggravating factors: movement, activity Relieving factors: rest, meds  PRECAUTIONS: None for knee, pacemaker  RED FLAGS: None   WEIGHT BEARING RESTRICTIONS: No  FALLS:  Has patient fallen in last 6 months? No  LIVING ENVIRONMENT: Lives with: lives with their family Lives in: House/apartment Stairs: 2 Has following equipment at home: Environmental Consultant - 2 wheeled  OCCUPATION: not working  PLOF: Independent  PATIENT GOALS: To regain my knee function  NEXT MD VISIT: 10/13/24  OBJECTIVE:  Note: Objective measures were completed at Evaluation unless otherwise noted.  DIAGNOSTIC FINDINGS: none  PATIENT SURVEYS:  LEFS 39/80   MUSCLE LENGTH: N/T  POSTURE: flexed R knee  10/21/24 PALPATION: Good patellar mobility, no redness/warmth and minimal swelling/edema  LUMBAR ROM: Flexion: nil loss, L HS tighter than R Extension: 25% loss, central low back pain Side bend L: R sided low back pain Side bend R: nil loss, no symptoms  LOWER EXTREMITY ROM:  A/PROM Right eval Left eval RT 10/21/24 RT 10/26/24 RT 10/28/24 RT 11/02/24 RT 11/04/24 RT 11/09/24 RT 11/12/24 RT 11/18/2024 RT 11/30/2024  Hip flexion             Hip extension             Hip abduction             Hip adduction             Hip internal rotation             Hip external rotation             Knee flexion 55/60d  AAROM 80d 72 A tx  88 A, 95  A 100 AAROM 100 A, 110A 112 116/120 118 120  Knee extension -16/-6d  AROM 10d lacking 7    5 5     Ankle dorsiflexion             Ankle plantarflexion             Ankle inversion             Ankle eversion              (Blank rows = not tested)  LOWER EXTREMITY MMT:  MMT Right eval Left eval  Hip flexion    Hip extension    Hip abduction    Hip adduction    Hip internal rotation    Hip  external rotation    Knee flexion 3/5   Knee extension 3/5   Ankle dorsiflexion    Ankle plantarflexion    Ankle inversion    Ankle eversion     (Blank rows = not tested)  LOWER EXTREMITY SPECIAL TESTS:  Deferred post-op  FUNCTIONAL TESTS:  30 seconds chair stand test 1 with UE assist  GAIT: Distance walked: 65ftx2 Assistive device utilized: Environmental Consultant - 2 wheeled Level of assistance: Modified independence Comments: slow cadence, step through pattern                                                                                                                           TREATMENT:  [facility] Adult PT Treatment:                                                DATE: 12/09/24 Therapeutic Exercise: Recumbent bike x6 mins  Repeated lumbar flexion in sitting x10: no effect on symptoms Sustained lumbar extension in prone prop x3 mins, supported Manual Therapy: Sciatic nn glide in supine with SLR, IV DF bias 3x15  Gait training: Pt able to complete gait training on level surface without AD demonstrating improved reciprocal pattern, improved arm swing and weight bearing both as distance progresses and as distractions are integrated. Pt encouraged to complete walking later in her routine after completing warm up, will modify home distance as tolerated.    [facility] Adult PT Treatment:                                                DATE: 12/04/24 Therapeutic Exercise: Lumbar extension in standing x10, no effect  Sustained lumbar extension in prone x3 mins, dec better pain with right left side glide in  standing, potentially better in knee, will assess over the weekend.  Recumbent bike x5 mins  Heel raises 2x15 with HHA    [facility] Adult PT Treatment:                                                DATE: 11/30/24 Recumbent bike L1 x 4 min ( lowering seat at 2 min ) Elliptical L1, ramp L1, x 4 min  Quadruped lateral rocking, with posterior rocking into childs pose Lunge touching back knee on to bosu 1 x 5 bil  Reviewed todays exercises and doing the elliptical at home.   [facility] Adult PT Treatment:  DATE: 11/25/24 Therapeutic Exercise: Recumbent bike x6 mins level 1 with full cycles from initiation SLR x25 RLE, extension lag noted at rep 22 Knee flexion extension in sitting on physioball x30 RLE Standing knee flexion x5 sec hold x10 reps 3 way hip in standing x10 reps HEP revised and provided   [facility] Adult PT Treatment:                                                DATE: 11/23/24 Recumbent bike x 8 min lowering seat at 4 min  MTPR and the vastus lateralis and glute med Step up/ down on 6 inch step forward/backward x 10, then stepping up and over focusing on the down 1 x 5 Low load long duration hamstring stretch during manual techniques Seated hamstring STW along distal semi-membranosus Reviewed gait and impact of limited knee extension at heel strike and early flexion limiting stride toe off.  Game ready medium pressure x 10 min   [facility] Adult PT Treatment:                                                DATE: 11/18/24 Recumbent bike L 3 x 6 min lowering seat every 2 min to promote ROM Standing quat holding onto freemotion 1 x 10 Standing hip abduction/ extension bil 1 x 12 with RTB LAQ 1 x 10 with 10# bil. 1 x 5 con bil/ ecc RLE Sit to stand with LLE advanced 3 inches.  MTPR along the hamstring with tennis ball Tack and stretch along the hamstring with tennis ball Reviewed and updated HEP.   PATIENT EDUCATION:  Education details:  Discussed eval findings, rehab rationale and POC and patient is in agreement  Person educated: Patient and Spouse Education method: Explanation and Handouts Education comprehension: verbalized understanding and needs further education  HOME EXERCISE PROGRAM: Access Code: [email or web address] URL: [email or web address] Date: 12/04/2024 Prepared by: Stann Ohara  Program Notes forward / backward weight shifting with the right foot advanced forward. when rocking forward put weight on the right leg, when rocking backward rock on to the left leg and pull the toes up on the right foot and tighten your quad and straighten your knee.seated on high table/ surface, swinging the right and left knee. Swing R knee forward while left knee goes back and vice versa.   Exercises - Active Straight Leg Raise with Quad Set  - 1 x daily - 4 x weekly - 2 sets - 15 reps - 3 hold - Sit to Stand with Hands on Knees  - 1 x daily - 4 x weekly - 3 sets - 12 reps - Standing 3-way Hip with Walker  - 1 x daily - 4 x weekly - 1 sets - 10 reps - 2 hold - Heel Raises with Counter Support  - 1 x daily - 4 x weekly - 3 sets - 15 reps - 2 hold - Standing Knee Flexion with Counter Support  - 1 x daily - 4 x weekly - 2 sets - 10 reps - 5 sec hold - Prone Press Up On Elbows  - 2 x daily - 7 x weekly - 1 sets - 1 reps - 3-5 min hold ASSESSMENT:  CLINICAL IMPRESSION:  12/09/2024  Pt presents with inc symptoms in her low back, after discussing response to provided interventions, pt has been actively holding herself in lumbar extension, encouraged passive positioning based on response from previous session, well tolerated. Reports that she feels these do help improve knee symptoms. Pt progresses to ROM and endurance progression in the left knee. ROM 0-125 today AROM supine. Pt reports feeling better than when she came in. Pt stands to benefit from continued skilled physical therapy to address deficit areas and restore safety with  activities and participations at home and in the community.     EVAL: Patient is a 58 y.o. female who was seen today for physical therapy evaluation and treatment following R TKA.   Patient is approximately 2 weeks postop with Aquacel dressing in place without signs of redness drainage or excessive warmth noted at knee and around dressing edges.  Patient ambulates with a rolling walker using a step through gait with slow cadence and decreased step length bilaterally.  She is able to transfer out of a chair independently but requires upper extremity assist to place right lower extremity onto table.  Patient is extremely fearful of knee flexion due to pain levels.  Patient is able to demonstrate good quad control and facilitation with extension range of motion improving throughout session.  Knee flexion remain patient's main limitation as she was apprehensive about pain.  Patient is a good candidate for outpatient rehab to regain right knee strength and function.  She has a 2-week postop follow-up with her surgeons office and has been recommended to discuss optimum pain relieving techniques to allow more comfortable participation in rehab.  OBJECTIVE IMPAIRMENTS: Abnormal gait, decreased activity tolerance, decreased balance, decreased coordination, decreased endurance, decreased knowledge of condition, decreased mobility, difficulty walking, decreased ROM, decreased strength, increased fascial restrictions, impaired perceived functional ability, improper body mechanics, and pain.   ACTIVITY LIMITATIONS: carrying, lifting, sitting, standing, squatting, stairs, transfers, bed mobility, toileting, and locomotion level  PERSONAL FACTORS: Behavior pattern, Fitness, and Past/current experiences are also affecting patient's functional outcome.   REHAB POTENTIAL: Good  CLINICAL DECISION MAKING: Stable/uncomplicated  EVALUATION COMPLEXITY: Low   GOALS: Goals reviewed with patient? No  SHORT TERM GOALS:  Target date: 11/09/2024   Patient to demonstrate independence in HEP  Baseline: [email or web address] Goal status: MET   2.  236ft ambulation with LRAD and appropriate gait pattern Baseline: 74ft with RW and step through pattern Goal status: MET  3.  90d AROM knee flexion Baseline: 55d Goal status: MET   LONG TERM GOALS: Target date: 12/07/2024    Patient will acknowledge 4/10 pain at least once during episode of care   Baseline: 8/10 Goal status: INITIAL  2.  Increase AROM of R knee to 115d flexion -5d extension Baseline:  A/PROM Right eval Left eval  Hip flexion    Hip extension    Hip abduction    Hip adduction    Hip internal rotation    Hip external rotation    Knee flexion 55/60d   Knee extension -16/-6d    Goal status: INITIAL  3.  Increase R knee strength to 4/5 Baseline:  MMT Right eval Left eval  Hip flexion    Hip extension    Hip abduction    Hip adduction    Hip internal rotation    Hip external rotation    Knee flexion 3/5   Knee extension 3/5    Goal status: INITIAL  4.  Patient to negotiate 16 stairs with  most appropriate pattern and assist. Baseline: TBD Goal status: INITIAL  5.  Patient will score at least 60/80 on LEFS to signify clinically meaningful improvement in functional abilities.   Baseline: 10/80 Goal status: INITIAL     PLAN:  PT FREQUENCY: 1-2x/week  PT DURATION: 8 weeks  PLANNED INTERVENTIONS: 97110-Therapeutic exercises, 97530- Therapeutic activity, 97112- Neuromuscular re-education, 97535- Self Care, [id number]- Manual therapy, [phone]- Gait training, Patient/Family education, Balance training, and Stair training  PLAN FOR NEXT SESSION: HEP review and update, manual techniques as appropriate, aerobic tasks, ROM and flexibility activities, strengthening and PREs, TPDN, gait and balance training,aquatic therapy,   Stann Ohara PT, DPT, CLT, CES 12/09/2024 10:18 AM           "

## 2024-12-14 ENCOUNTER — Ambulatory Visit: Attending: Physician Assistant | Admitting: Physical Therapy

## 2024-12-14 ENCOUNTER — Encounter: Payer: Self-pay | Admitting: Physical Therapy

## 2024-12-14 DIAGNOSIS — Z96651 Presence of right artificial knee joint: Secondary | ICD-10-CM | POA: Insufficient documentation

## 2024-12-14 DIAGNOSIS — M25561 Pain in right knee: Secondary | ICD-10-CM | POA: Insufficient documentation

## 2024-12-14 DIAGNOSIS — M6281 Muscle weakness (generalized): Secondary | ICD-10-CM | POA: Diagnosis present

## 2024-12-14 DIAGNOSIS — R2689 Other abnormalities of gait and mobility: Secondary | ICD-10-CM | POA: Diagnosis present

## 2024-12-14 NOTE — Addendum Note (Signed)
 Addended by: CHAYA PERFECT A on: 12/14/2024 12:29 PM   Modules accepted: Orders

## 2024-12-14 NOTE — Therapy (Addendum)
 " OUTPATIENT PHYSICAL THERAPY TREATMENT/RE CERTIFICATION  Patient Name: Kayla Archer MRN: [id number] DOB:[birth year], 59 y.o., female Today's Date: 12/14/2024  END OF SESSION:  PT End of Session - 12/14/24 1009     Visit Number 15    Number of Visits 16    Date for Recertification  01/27/25    Authorization Type BCBS    PT Start Time 1004    PT Stop Time 1045    PT Time Calculation (min) 41 min    Activity Tolerance Patient tolerated treatment well    Behavior During Therapy WFL for tasks assessed/performed                        Past Medical History:  Diagnosis Date   Cartilage tear    rt knee   CHB (complete heart block) (HCC) 04/24/2017   Overview:  History: Post-op  Assessment: CHB with escape rate 50-60's. S/p PPM placement 05.22.2018.   PMW intact. On low-dose BB.   Plan: Continue low dose BB at discharge. Follow EP recommendations at discharge.   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 04/30/2017 06/24/2017   PONV (postoperative nausea and vomiting)    plus hypotension   Past Surgical History:  Procedure Laterality Date   CARDIAC VALVE REPLACEMENT  04   thoracic aortic aneurysm plus valve   INSERT / REPLACE / REMOVE PACEMAKER     at [facility] 04/2017 for complete heart block   KNEE ARTHROSCOPY Right 12/28/2013   Procedure: ARTHROSCOPY KNEE;  Surgeon: Marcey Raman, MD;  Location: [facility] OR;  Service: Orthopedics;  Laterality: Right;   LABIOPLASTY Bilateral 10/04/2016   Procedure: LABIAL REDUCTION;  Surgeon: Rosaline Cobble, MD;  Location: [facility] ORS;  Service: Gynecology;  Laterality: Bilateral;   TEE WITHOUT CARDIOVERSION N/A 02/08/2017   Procedure: TRANSESOPHAGEAL ECHOCARDIOGRAM (TEE);  Surgeon: Jerel Balding, MD;  Location: [facility] ENDOSCOPY;  Service: Cardiovascular;  Laterality: N/A;   VALVE REPLACEMENT  04/2017 [facility]    Patient Active Problem List   Diagnosis Date Noted   Acute medial meniscus tear of right knee 06/20/2023   Other tear of medial meniscus, current injury, left knee, subsequent encounter 06/20/2023   Heart disease 05/09/2021   Encounter for care of pacemaker 09/27/2019   Piriformis syndrome of left side 06/16/2019   H/O thoracic aortic aneurysm repair 02/14/2019   Other cytomegaloviral diseases (HCC) 02/14/2019   Vitamin D deficiency 02/14/2019   Degenerative arthritis of right knee 05/14/2018   Nontraumatic neck pain 10/25/2017   Nonallopathic lesion of cervical region 10/25/2017   Nonallopathic lesion of rib cage 10/25/2017   Hx of Lyme disease 07/15/2017   Pacemaker Medtronic 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 04/30/2017 06/24/2017   Atrial flutter (HCC) 05/02/2017   Transition of care performed with sharing of clinical summary 04/26/2017   CHB (complete heart block) (HCC) 04/24/2017   PFO (patent foramen ovale) 03/13/2017   Prosthetic aortic valve stenosis    Nonallopathic lesion of lumbosacral region 11/20/2016   Nonallopathic lesion of sacral region 11/20/2016   Nonallopathic lesion of thoracic region 11/20/2016   Left lumbar radiculopathy 11/13/2016   Left knee injury 03/29/2016   S/P arthroscopy of knee 11/02/2014   Mid sternal chest pain 03/31/2014   Aortic regurgitation 03/31/2014   S/P aortic valve replacement with bioprosthetic valve 03/31/2014  Knee joint pain 10/20/2013   Vitiligo 08/09/2003    PCP: Jacques Camie Pepper, PA-C   REFERRING PROVIDER: Viann Jayson Raddle., MD  REFERRING DIAG: sTATUSW POST RIGHT KNEE  THERAPY DIAG:  Acute pain of right knee  Other abnormalities of gait and mobility  Muscle weakness (generalized)  History of arthroplasty of right knee  Rationale for Evaluation and Treatment: Rehabilitation  ONSET DATE: 09/29/24 DOS  SUBJECTIVE:   SUBJECTIVE STATEMENT: 12/14/2024 Pt states that she is having the best morning I have  had since the surgery. Reports compliance with HEP. Pt taking tylenol  BID vs TID. Continues meloxicam  as previous. Pt reports improved sciatic symptoms.   EVAL: Presents to OPPT following R TKA following long standing history of R knee pain and dysfunction and unsuccessful conservative measures.  Patient focused on pain level with reluctance to rely on pain meds for fear of reliance on them.    PERTINENT HISTORY: None available PAIN:  Are you having pain? Yes: NPRS scale: 2/10 Pain location: R knee Pain description: ache, throb, sharp Aggravating factors: movement, activity Relieving factors: rest, meds  PRECAUTIONS: None for knee, pacemaker  RED FLAGS: None   WEIGHT BEARING RESTRICTIONS: No  FALLS:  Has patient fallen in last 6 months? No  LIVING ENVIRONMENT: Lives with: lives with their family Lives in: House/apartment Stairs: 2 Has following equipment at home: Environmental Consultant - 2 wheeled  OCCUPATION: not working  PLOF: Independent  PATIENT GOALS: To regain my knee function  NEXT MD VISIT: 10/13/24  OBJECTIVE:  Note: Objective measures were completed at Evaluation unless otherwise noted.  DIAGNOSTIC FINDINGS: none  PATIENT SURVEYS:  LEFS 39/80   MUSCLE LENGTH: N/T  POSTURE: flexed R knee  10/21/24 PALPATION: Good patellar mobility, no redness/warmth and minimal swelling/edema  LUMBAR ROM: Flexion: nil loss, L HS tighter than R Extension: 25% loss, central low back pain Side bend L: R sided low back pain Side bend R: nil loss, no symptoms  LOWER EXTREMITY ROM:  A/PROM Right eval Left eval RT 10/21/24 RT 10/26/24 RT 10/28/24 RT 11/02/24 RT 11/04/24 RT 11/09/24 RT 11/12/24 RT 11/18/2024 RT 11/30/2024 RLE 12/14/24  Hip flexion              Hip extension              Hip abduction              Hip adduction              Hip internal rotation              Hip external rotation              Knee flexion 55/60d  AAROM 80d 72 A tx  88 A, 95 A 100 AAROM  100 A, 110A 112 116/120 118 120 129  Knee extension -16/-6d  AROM 10d lacking 7    5 5    0  Ankle dorsiflexion              Ankle plantarflexion              Ankle inversion              Ankle eversion               (Blank rows = not tested)  LOWER EXTREMITY MMT:  MMT Right eval Left eval RLE 12/14/24  Hip flexion     Hip extension     Hip abduction     Hip adduction  Hip internal rotation     Hip external rotation     Knee flexion 3/5  4/5  Knee extension 3/5  4-/5  Ankle dorsiflexion     Ankle plantarflexion     Ankle inversion     Ankle eversion      (Blank rows = not tested)  LOWER EXTREMITY SPECIAL TESTS:  Deferred post-op  FUNCTIONAL TESTS:  30 seconds chair stand test 1 with UE assist  GAIT: Distance walked: 26ftx2 Assistive device utilized: Environmental Consultant - 2 wheeled Level of assistance: Modified independence Comments: slow cadence, step through pattern                                                                                                                           TREATMENT:   [facility] Adult PT Treatment:                                                DATE: 12/14/24 Therapeutic Exercise: Recumbent bike x6 mins Lateral step downs 2x10 BLE 3 inch step Paloff press with moderate resistance-stride stance BLE back x30  Therapeutic Activity: PT POC reviewed and discussed, goals assessed to reflect current status.     [facility] Adult PT Treatment:                                                DATE: 12/09/24 Therapeutic Exercise: Recumbent bike x6 mins  Repeated lumbar flexion in sitting x10: no effect on symptoms Sustained lumbar extension in prone prop x3 mins, supported Manual Therapy: Sciatic nn glide in supine with SLR, IV DF bias 3x15  Gait training: Pt able to complete gait training on level surface without AD demonstrating improved reciprocal pattern, improved arm swing and weight bearing both as distance progresses and as distractions are integrated. Pt  encouraged to complete walking later in her routine after completing warm up, will modify home distance as tolerated.    [facility] Adult PT Treatment:                                                DATE: 12/04/24 Therapeutic Exercise: Lumbar extension in standing x10, no effect  Sustained lumbar extension in prone x3 mins, dec better pain with right left side glide in standing, potentially better in knee, will assess over the weekend.  Recumbent bike x5 mins  Heel raises 2x15 with HHA    [facility] Adult PT Treatment:  DATE: 11/30/24 Recumbent bike L1 x 4 min ( lowering seat at 2 min ) Elliptical L1, ramp L1, x 4 min  Quadruped lateral rocking, with posterior rocking into childs pose Lunge touching back knee on to bosu 1 x 5 bil  Reviewed todays exercises and doing the elliptical at home.   [facility] Adult PT Treatment:                                                DATE: 11/25/24 Therapeutic Exercise: Recumbent bike x6 mins level 1 with full cycles from initiation SLR x25 RLE, extension lag noted at rep 22 Knee flexion extension in sitting on physioball x30 RLE Standing knee flexion x5 sec hold x10 reps 3 way hip in standing x10 reps HEP revised and provided   [facility] Adult PT Treatment:                                                DATE: 11/23/24 Recumbent bike x 8 min lowering seat at 4 min  MTPR and the vastus lateralis and glute med Step up/ down on 6 inch step forward/backward x 10, then stepping up and over focusing on the down 1 x 5 Low load long duration hamstring stretch during manual techniques Seated hamstring STW along distal semi-membranosus Reviewed gait and impact of limited knee extension at heel strike and early flexion limiting stride toe off.  Game ready medium pressure x 10 min     PATIENT EDUCATION:  Education details: Discussed eval findings, rehab rationale and POC and patient is in agreement  Person educated: Patient and  Spouse Education method: Explanation and Handouts Education comprehension: verbalized understanding and needs further education  HOME EXERCISE PROGRAM: Access Code: [email or web address] URL: [email or web address] Date: 12/04/2024 Prepared by: Stann Ohara  Program Notes forward / backward weight shifting with the right foot advanced forward. when rocking forward put weight on the right leg, when rocking backward rock on to the left leg and pull the toes up on the right foot and tighten your quad and straighten your knee.seated on high table/ surface, swinging the right and left knee. Swing R knee forward while left knee goes back and vice versa.   Exercises - Active Straight Leg Raise with Quad Set  - 1 x daily - 4 x weekly - 2 sets - 15 reps - 3 hold - Sit to Stand with Hands on Knees  - 1 x daily - 4 x weekly - 3 sets - 12 reps - Standing 3-way Hip with Walker  - 1 x daily - 4 x weekly - 1 sets - 10 reps - 2 hold - Heel Raises with Counter Support  - 1 x daily - 4 x weekly - 3 sets - 15 reps - 2 hold - Standing Knee Flexion with Counter Support  - 1 x daily - 4 x weekly - 2 sets - 10 reps - 5 sec hold - Prone Press Up On Elbows  - 2 x daily - 7 x weekly - 1 sets - 1 reps - 3-5 min hold ASSESSMENT:  CLINICAL IMPRESSION:  12/14/2024  Pt has been responding well to her current regimen and has been able to progress functional activity tolerance. Remains limited  in endurance and power of RLE. Pt stands to benefit from additional 6 weeks of skilled physical therapy to address deficits and restore function and independence with activities and participations at home and in the community.    EVAL: Patient is a 59 y.o. female who was seen today for physical therapy evaluation and treatment following R TKA.   Patient is approximately 2 weeks postop with Aquacel dressing in place without signs of redness drainage or excessive warmth noted at knee and around dressing edges.  Patient ambulates with a  rolling walker using a step through gait with slow cadence and decreased step length bilaterally.  She is able to transfer out of a chair independently but requires upper extremity assist to place right lower extremity onto table.  Patient is extremely fearful of knee flexion due to pain levels.  Patient is able to demonstrate good quad control and facilitation with extension range of motion improving throughout session.  Knee flexion remain patient's main limitation as she was apprehensive about pain.  Patient is a good candidate for outpatient rehab to regain right knee strength and function.  She has a 2-week postop follow-up with her surgeons office and has been recommended to discuss optimum pain relieving techniques to allow more comfortable participation in rehab.  OBJECTIVE IMPAIRMENTS: Abnormal gait, decreased activity tolerance, decreased balance, decreased coordination, decreased endurance, decreased knowledge of condition, decreased mobility, difficulty walking, decreased ROM, decreased strength, increased fascial restrictions, impaired perceived functional ability, improper body mechanics, and pain.   ACTIVITY LIMITATIONS: carrying, lifting, sitting, standing, squatting, stairs, transfers, bed mobility, toileting, and locomotion level  PERSONAL FACTORS: Behavior pattern, Fitness, and Past/current experiences are also affecting patient's functional outcome.   REHAB POTENTIAL: Good  CLINICAL DECISION MAKING: Stable/uncomplicated  EVALUATION COMPLEXITY: Low   GOALS: Goals reviewed with patient? No  SHORT TERM GOALS: Target date: 11/09/2024   Patient to demonstrate independence in HEP  Baseline: [id number] Goal status: MET   2.  277ft ambulation with LRAD and appropriate gait pattern Baseline: 48ft with RW and step through pattern Goal status: MET  3.  90d AROM knee flexion Baseline: 55d Goal status: MET   LONG TERM GOALS: Target date: 01/27/25    Patient will acknowledge  4/10 pain at least once during episode of care   Baseline: 8/10 12/14/24: 2/10 Goal status: MET   2.  Increase AROM of R knee to 115d flexion -5d extension Baseline:  A/PROM Right eval Left eval  Hip flexion    Hip extension    Hip abduction    Hip adduction    Hip internal rotation    Hip external rotation    Knee flexion 55/60d   Knee extension -16/-6d    Goal status: MET 12/15/24  3.  Increase R knee strength to 4/5 Baseline:  MMT Right eval Left eval  Hip flexion    Hip extension    Hip abduction    Hip adduction    Hip internal rotation    Hip external rotation    Knee flexion 3/5   Knee extension 3/5    Goal status: IN PROGRESS (12/14/24)  4.  Patient to negotiate 16 stairs with most appropriate pattern and assist. Baseline: TBD 12/14/24: dec tolerance to RLE advancement with steps Goal status: IN PROGRESS  5.  Patient will score at least 60/80 on LEFS to signify clinically meaningful improvement in functional abilities.   Baseline: 10/80 12/14/24: 24/80 Goal status: IN PROGRESS     PLAN:  PT FREQUENCY: 1-2x/week  PT DURATION: 6 weeks  PLANNED INTERVENTIONS: 97110-Therapeutic exercises, 97530- Therapeutic activity, 97112- Neuromuscular re-education, 97535- Self Care, [id number]- Manual therapy, [phone]- Gait training, Patient/Family education, Balance training, and Stair training  PLAN FOR NEXT SESSION: HEP review and update, manual techniques as appropriate, aerobic tasks, ROM and flexibility activities, strengthening and PREs, TPDN, gait and balance training,aquatic therapy,   Stann Ohara PT, DPT, CLT, CES 12/14/2024 11:28 AM           "

## 2024-12-16 ENCOUNTER — Ambulatory Visit: Admitting: Physical Therapy

## 2024-12-16 DIAGNOSIS — M25561 Pain in right knee: Secondary | ICD-10-CM

## 2024-12-16 DIAGNOSIS — R2689 Other abnormalities of gait and mobility: Secondary | ICD-10-CM

## 2024-12-16 DIAGNOSIS — M6281 Muscle weakness (generalized): Secondary | ICD-10-CM

## 2024-12-16 NOTE — Therapy (Addendum)
 " OUTPATIENT PHYSICAL THERAPY TREATMENT  Patient Name: Kayla Archer MRN: [id number] DOB:[birth year], 59 y.o., female Today's Date: 12/16/2024  END OF SESSION:  PT End of Session - 12/16/24 0939     Visit Number 16    Number of Visits 18    Date for Recertification  01/27/25    Authorization Type BC[employer]    PT Start Time 0935    PT Stop Time 1025    PT Time Calculation (min) 50 min    Equipment Utilized During Treatment Gait belt    Activity Tolerance Patient tolerated treatment well    Behavior During Therapy WFL for tasks assessed/performed                         Past Medical History:  Diagnosis Date   Cartilage tear    rt knee   CHB (complete heart block) (HCC) 04/24/2017   Overview:  History: Post-op  Assessment: CHB with escape rate 50-60's. S/p PPM placement 05.22.2018.   PMW intact. On low-dose BB.   Plan: Continue low dose BB at discharge. Follow EP recommendations at discharge.   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 04/30/2017 06/24/2017   PONV (postoperative nausea and vomiting)    plus hypotension   Past Surgical History:  Procedure Laterality Date   CARDIAC VALVE REPLACEMENT  04   thoracic aortic aneurysm plus valve   INSERT / REPLACE / REMOVE PACEMAKER     at [facility] 04/2017 for complete heart block   KNEE ARTHROSCOPY Right 12/28/2013   Procedure: ARTHROSCOPY KNEE;  Surgeon: Marcey Raman, MD;  Location: [facility] OR;  Service: Orthopedics;  Laterality: Right;   LABIOPLASTY Bilateral 10/04/2016   Procedure: LABIAL REDUCTION;  Surgeon: Rosaline Cobble, MD;  Location: [facility] ORS;  Service: Gynecology;  Laterality: Bilateral;   TEE WITHOUT CARDIOVERSION N/A 02/08/2017   Procedure: TRANSESOPHAGEAL ECHOCARDIOGRAM (TEE);  Surgeon: Jerel Balding, MD;  Location: [facility] ENDOSCOPY;  Service: Cardiovascular;  Laterality: N/A;   VALVE  REPLACEMENT  04/2017 [facility]   Patient Active Problem List   Diagnosis Date Noted   Acute medial meniscus tear of right knee 06/20/2023   Other tear of medial meniscus, current injury, left knee, subsequent encounter 06/20/2023   Heart disease 05/09/2021   Encounter for care of pacemaker 09/27/2019   Piriformis syndrome of left side 06/16/2019   H/O thoracic aortic aneurysm repair 02/14/2019   Other cytomegaloviral diseases (HCC) 02/14/2019   Vitamin D deficiency 02/14/2019   Degenerative arthritis of right knee 05/14/2018   Nontraumatic neck pain 10/25/2017   Nonallopathic lesion of cervical region 10/25/2017   Nonallopathic lesion of rib cage 10/25/2017   Hx of Lyme disease 07/15/2017   Pacemaker Medtronic 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 04/30/2017 06/24/2017   Atrial flutter (HCC) 05/02/2017   Transition of care performed with sharing of clinical summary 04/26/2017   CHB (complete heart block) (HCC) 04/24/2017   PFO (patent foramen ovale) 03/13/2017   Prosthetic aortic valve stenosis    Nonallopathic lesion of lumbosacral region 11/20/2016   Nonallopathic lesion of sacral region 11/20/2016   Nonallopathic lesion of thoracic region 11/20/2016   Left lumbar radiculopathy 11/13/2016   Left knee injury 03/29/2016   S/P arthroscopy of knee 11/02/2014   Mid sternal chest pain 03/31/2014   Aortic regurgitation 03/31/2014  S/P aortic valve replacement with bioprosthetic valve 03/31/2014   Knee joint pain 10/20/2013   Vitiligo 08/09/2003    PCP: Jacques Camie Pepper, PA-C   REFERRING PROVIDER: Viann Jayson Raddle., MD  REFERRING DIAG: sTATUSW POST RIGHT KNEE  THERAPY DIAG:  Acute pain of right knee  Other abnormalities of gait and mobility  Muscle weakness (generalized)  Rationale for Evaluation and Treatment: Rehabilitation  ONSET DATE: 09/29/24 DOS  SUBJECTIVE:   SUBJECTIVE STATEMENT: 12/16/2024 Monday was my best day, I then did a lot and was sore from  returning to working full shifts and noticed a change in my walking pattern.   EVAL: Presents to OPPT following R TKA following long standing history of R knee pain and dysfunction and unsuccessful conservative measures.  Patient focused on pain level with reluctance to rely on pain meds for fear of reliance on them.    PERTINENT HISTORY: None available PAIN:  Are you having pain? Yes: NPRS scale: 2/10 Pain location: R knee Pain description: ache, throb, sharp Aggravating factors: movement, activity Relieving factors: rest, meds  PRECAUTIONS: None for knee, pacemaker  RED FLAGS: None   WEIGHT BEARING RESTRICTIONS: No  FALLS:  Has patient fallen in last 6 months? No  LIVING ENVIRONMENT: Lives with: lives with their family Lives in: House/apartment Stairs: 2 Has following equipment at home: Environmental Consultant - 2 wheeled  OCCUPATION: not working  PLOF: Independent  PATIENT GOALS: To regain my knee function  NEXT MD VISIT: 10/13/24  OBJECTIVE:  Note: Objective measures were completed at Evaluation unless otherwise noted.  DIAGNOSTIC FINDINGS: none  PATIENT SURVEYS:  LEFS 39/80   MUSCLE LENGTH: N/T  POSTURE: flexed R knee  10/21/24 PALPATION: Good patellar mobility, no redness/warmth and minimal swelling/edema  LUMBAR ROM: Flexion: nil loss, L HS tighter than R Extension: 25% loss, central low back pain Side bend L: R sided low back pain Side bend R: nil loss, no symptoms  LOWER EXTREMITY ROM:  A/PROM Right eval Left eval RT 10/21/24 RT 10/26/24 RT 10/28/24 RT 11/02/24 RT 11/04/24 RT 11/09/24 RT 11/12/24 RT 11/18/2024 RT 11/30/2024 RLE 12/14/24  Hip flexion              Hip extension              Hip abduction              Hip adduction              Hip internal rotation              Hip external rotation              Knee flexion 55/60d  AAROM 80d 72 A tx  88 A, 95 A 100 AAROM 100 A, 110A 112 116/120 118 120 129  Knee extension -16/-6d  AROM 10d  lacking 7    5 5    0  Ankle dorsiflexion              Ankle plantarflexion              Ankle inversion              Ankle eversion               (Blank rows = not tested)  LOWER EXTREMITY MMT:  MMT Right eval Left eval RLE 12/14/24  Hip flexion     Hip extension     Hip abduction     Hip adduction  Hip internal rotation     Hip external rotation     Knee flexion 3/5  4/5  Knee extension 3/5  4-/5  Ankle dorsiflexion     Ankle plantarflexion     Ankle inversion     Ankle eversion      (Blank rows = not tested)  LOWER EXTREMITY SPECIAL TESTS:  Deferred post-op  FUNCTIONAL TESTS:  30 seconds chair stand test 1 with UE assist  GAIT: Distance walked: 25ftx2 Assistive device utilized: Environmental Consultant - 2 wheeled Level of assistance: Modified independence Comments: slow cadence, step through pattern                                                                                                                           TREATMENT:  [facility] Adult PT Treatment:                                                DATE: 12/16/2024 Ellipitical L5 ramp L 5 x 5 min  Piriformis stretch 1 x 30 sec Getting onto and off of the floor using lunge technique (LLE leading) and mod cues/ demonstration for form Game ready x 10 min, medium pressure, 34 degrees  [facility] Adult PT Treatment:                                                DATE: 12/14/24 Therapeutic Exercise: Recumbent bike x6 mins Lateral step downs 2x10 BLE 3 inch step Paloff press with moderate resistance-stride stance BLE back x30  Therapeutic Activity: PT POC reviewed and discussed, goals assessed to reflect current status.    [facility] Adult PT Treatment:                                                DATE: 12/09/24 Therapeutic Exercise: Recumbent bike x6 mins  Repeated lumbar flexion in sitting x10: no effect on symptoms Sustained lumbar extension in prone prop x3 mins, supported Manual Therapy: Sciatic nn glide in supine with SLR, IV DF  bias 3x15  Gait training: Pt able to complete gait training on level surface without AD demonstrating improved reciprocal pattern, improved arm swing and weight bearing both as distance progresses and as distractions are integrated. Pt encouraged to complete walking later in her routine after completing warm up, will modify home distance as tolerated.    [facility] Adult PT Treatment:  DATE: 12/04/24 Therapeutic Exercise: Lumbar extension in standing x10, no effect  Sustained lumbar extension in prone x3 mins, dec better pain with right left side glide in standing, potentially better in knee, will assess over the weekend.  Recumbent bike x5 mins  Heel raises 2x15 with HHA    [facility] Adult PT Treatment:                                                DATE: 11/30/24 Recumbent bike L1 x 4 min ( lowering seat at 2 min ) Elliptical L1, ramp L1, x 4 min  Quadruped lateral rocking, with posterior rocking into childs pose Lunge touching back knee on to bosu 1 x 5 bil  Reviewed todays exercises and doing the elliptical at home.    PATIENT EDUCATION:  Education details: Discussed eval findings, rehab rationale and POC and patient is in agreement  Person educated: Patient and Spouse Education method: Explanation and Handouts Education comprehension: verbalized understanding and needs further education  HOME EXERCISE PROGRAM: Access Code: [email or web address] URL: [email or web address] Date: 12/04/2024 Prepared by: Stann Ohara  Program Notes forward / backward weight shifting with the right foot advanced forward. when rocking forward put weight on the right leg, when rocking backward rock on to the left leg and pull the toes up on the right foot and tighten your quad and straighten your knee.seated on high table/ surface, swinging the right and left knee. Swing R knee forward while left knee goes back and vice versa.   Exercises - Active Straight  Leg Raise with Quad Set  - 1 x daily - 4 x weekly - 2 sets - 15 reps - 3 hold - Sit to Stand with Hands on Knees  - 1 x daily - 4 x weekly - 3 sets - 12 reps - Standing 3-way Hip with Walker  - 1 x daily - 4 x weekly - 1 sets - 10 reps - 2 hold - Heel Raises with Counter Support  - 1 x daily - 4 x weekly - 3 sets - 15 reps - 2 hold - Standing Knee Flexion with Counter Support  - 1 x daily - 4 x weekly - 2 sets - 10 reps - 5 sec hold - Prone Press Up On Elbows  - 2 x daily - 7 x weekly - 1 sets - 1 reps - 3-5 min hold ASSESSMENT:  CLINICAL IMPRESSION:  12/16/2024  pt arrives to session reporting she has returned to work full time and overall is doing good but has mostly maintained her current status as it relates to her knee. Focused today's session on getting onto and up from the floor. She did good with this demonstrating some apprehension and required mod cues and demonstration to assist with technique and form. Utilized game ready end of session to reduce soreness. Reviewed HEP and discussed benefits of doing lunges to help with today's task and continue to promote strengthening.    EVAL: Patient is a 59 y.o. female who was seen today for physical therapy evaluation and treatment following R TKA.   Patient is approximately 2 weeks postop with Aquacel dressing in place without signs of redness drainage or excessive warmth noted at knee and around dressing edges.  Patient ambulates with a rolling walker using a step through gait with slow cadence and decreased step length bilaterally.  She is able to transfer out of a chair independently but requires upper extremity assist to place right lower extremity onto table.  Patient is extremely fearful of knee flexion due to pain levels.  Patient is able to demonstrate good quad control and facilitation with extension range of motion improving throughout session.  Knee flexion remain patient's main limitation as she was apprehensive about pain.  Patient is a good  candidate for outpatient rehab to regain right knee strength and function.  She has a 2-week postop follow-up with her surgeons office and has been recommended to discuss optimum pain relieving techniques to allow more comfortable participation in rehab.  OBJECTIVE IMPAIRMENTS: Abnormal gait, decreased activity tolerance, decreased balance, decreased coordination, decreased endurance, decreased knowledge of condition, decreased mobility, difficulty walking, decreased ROM, decreased strength, increased fascial restrictions, impaired perceived functional ability, improper body mechanics, and pain.   ACTIVITY LIMITATIONS: carrying, lifting, sitting, standing, squatting, stairs, transfers, bed mobility, toileting, and locomotion level  PERSONAL FACTORS: Behavior pattern, Fitness, and Past/current experiences are also affecting patient's functional outcome.   REHAB POTENTIAL: Good  CLINICAL DECISION MAKING: Stable/uncomplicated  EVALUATION COMPLEXITY: Low   GOALS: Goals reviewed with patient? No  SHORT TERM GOALS: Target date: 11/09/2024   Patient to demonstrate independence in HEP  Baseline: [email or web address] Goal status: MET   2.  265ft ambulation with LRAD and appropriate gait pattern Baseline: 55ft with RW and step through pattern Goal status: MET  3.  90d AROM knee flexion Baseline: 55d Goal status: MET   LONG TERM GOALS: Target date: 12/07/2024    Patient will acknowledge 4/10 pain at least once during episode of care   Baseline: 8/10 12/14/24: 2/10 Goal status: MET   2.  Increase AROM of R knee to 115d flexion -5d extension Baseline:  A/PROM Right eval Left eval  Hip flexion    Hip extension    Hip abduction    Hip adduction    Hip internal rotation    Hip external rotation    Knee flexion 55/60d   Knee extension -16/-6d    Goal status: MET 12/15/24  3.  Increase R knee strength to 4/5 Baseline:  MMT Right eval Left eval  Hip flexion    Hip extension    Hip  abduction    Hip adduction    Hip internal rotation    Hip external rotation    Knee flexion 3/5   Knee extension 3/5    Goal status: IN PROGRESS (12/14/24)  4.  Patient to negotiate 16 stairs with most appropriate pattern and assist. Baseline: TBD 12/14/24: dec tolerance to RLE advancement with steps Goal status: IN PROGRESS  5.  Patient will score at least 60/80 on LEFS to signify clinically meaningful improvement in functional abilities.   Baseline: 10/80 12/14/24: 24/80 Goal status: IN PROGRESS     PLAN:  PT FREQUENCY: 1-2x/week  PT DURATION: 8 weeks  PLANNED INTERVENTIONS: 97110-Therapeutic exercises, 97530- Therapeutic activity, 97112- Neuromuscular re-education, 97535- Self Care, [id number]- Manual therapy, [phone]- Gait training, Patient/Family education, Balance training, and Stair training  PLAN FOR NEXT SESSION: HEP review and update, manual techniques as appropriate, aerobic tasks, ROM and flexibility activities, strengthening and PREs, TPDN, gait and balance training,aquatic therapy,   Monica Fresh PT, DPT, LAT, ATC.  12/16/2024 11:31 AM    Joneen Fresh PT, DPT, LAT, ATC  12/21/2024  9:58 AM             "

## 2024-12-21 ENCOUNTER — Encounter: Payer: Self-pay | Admitting: Physical Therapy

## 2024-12-21 ENCOUNTER — Ambulatory Visit: Admitting: Physical Therapy

## 2024-12-21 DIAGNOSIS — M25561 Pain in right knee: Secondary | ICD-10-CM

## 2024-12-21 DIAGNOSIS — R2689 Other abnormalities of gait and mobility: Secondary | ICD-10-CM

## 2024-12-21 DIAGNOSIS — M6281 Muscle weakness (generalized): Secondary | ICD-10-CM

## 2024-12-21 NOTE — Therapy (Signed)
 " OUTPATIENT PHYSICAL THERAPY TREATMENT  Patient Name: Kayla Archer MRN: [id number] DOB:[birth year], 59 y.o., female Today's Date: 12/21/2024  END OF SESSION:  PT End of Session - 12/21/24 1103     Visit Number 17    Number of Visits 30    Date for Recertification  01/27/25    Authorization Type BC[employer]    PT Start Time 1103    PT Stop Time 1149    PT Time Calculation (min) 46 min    Activity Tolerance Patient tolerated treatment well    Behavior During Therapy WFL for tasks assessed/performed                          Past Medical History:  Diagnosis Date   Cartilage tear    rt knee   CHB (complete heart block) (HCC) 04/24/2017   Overview:  History: Post-op  Assessment: CHB with escape rate 50-60's. S/p PPM placement 05.22.2018.   PMW intact. On low-dose BB.   Plan: Continue low dose BB at discharge. Follow EP recommendations at discharge.   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 04/30/2017 06/24/2017   PONV (postoperative nausea and vomiting)    plus hypotension   Past Surgical History:  Procedure Laterality Date   CARDIAC VALVE REPLACEMENT  04   thoracic aortic aneurysm plus valve   INSERT / REPLACE / REMOVE PACEMAKER     at [facility] 04/2017 for complete heart block   KNEE ARTHROSCOPY Right 12/28/2013   Procedure: ARTHROSCOPY KNEE;  Surgeon: Marcey Raman, MD;  Location: [facility] OR;  Service: Orthopedics;  Laterality: Right;   LABIOPLASTY Bilateral 10/04/2016   Procedure: LABIAL REDUCTION;  Surgeon: Rosaline Cobble, MD;  Location: [facility] ORS;  Service: Gynecology;  Laterality: Bilateral;   TEE WITHOUT CARDIOVERSION N/A 02/08/2017   Procedure: TRANSESOPHAGEAL ECHOCARDIOGRAM (TEE);  Surgeon: Jerel Balding, MD;  Location: [facility] ENDOSCOPY;  Service: Cardiovascular;  Laterality: N/A;   VALVE REPLACEMENT  04/2017 [facility]   Patient  Active Problem List   Diagnosis Date Noted   Acute medial meniscus tear of right knee 06/20/2023   Other tear of medial meniscus, current injury, left knee, subsequent encounter 06/20/2023   Heart disease 05/09/2021   Encounter for care of pacemaker 09/27/2019   Piriformis syndrome of left side 06/16/2019   H/O thoracic aortic aneurysm repair 02/14/2019   Other cytomegaloviral diseases (HCC) 02/14/2019   Vitamin D deficiency 02/14/2019   Degenerative arthritis of right knee 05/14/2018   Nontraumatic neck pain 10/25/2017   Nonallopathic lesion of cervical region 10/25/2017   Nonallopathic lesion of rib cage 10/25/2017   Hx of Lyme disease 07/15/2017   Pacemaker Medtronic 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 04/30/2017 06/24/2017   Atrial flutter (HCC) 05/02/2017   Transition of care performed with sharing of clinical summary 04/26/2017   CHB (complete heart block) (HCC) 04/24/2017   PFO (patent foramen ovale) 03/13/2017   Prosthetic aortic valve stenosis    Nonallopathic lesion of lumbosacral region 11/20/2016   Nonallopathic lesion of sacral region 11/20/2016   Nonallopathic lesion of thoracic region 11/20/2016   Left lumbar radiculopathy 11/13/2016   Left knee injury 03/29/2016   S/P arthroscopy of knee 11/02/2014   Mid sternal chest pain 03/31/2014   Aortic regurgitation 03/31/2014   S/P aortic valve replacement with bioprosthetic valve  03/31/2014   Knee joint pain 10/20/2013   Vitiligo 08/09/2003    PCP: Jacques Camie Pepper, PA-C   REFERRING PROVIDER: Viann Jayson Raddle., MD  REFERRING DIAG: sTATUSW POST RIGHT KNEE  THERAPY DIAG:  Acute pain of right knee  Other abnormalities of gait and mobility  Muscle weakness (generalized)  Rationale for Evaluation and Treatment: Rehabilitation  ONSET DATE: 09/29/24 DOS  SUBJECTIVE:   SUBJECTIVE STATEMENT: 12/21/2024 I was pretty sore, after the last session and I got swelling that also went into my foot/ toes. It wasn't  as bad as the last time I pushed hard. I did [facility] which helped the quad.   EVAL: Presents to OPPT following R TKA following long standing history of R knee pain and dysfunction and unsuccessful conservative measures.  Patient focused on pain level with reluctance to rely on pain meds for fear of reliance on them.    PERTINENT HISTORY: None available PAIN:  Are you having pain? Yes: NPRS scale: 2/10 (had tylenol  and advil  this AM) Pain location: R knee Pain description: ache, throb, sharp Aggravating factors: movement, activity Relieving factors: rest, meds  PRECAUTIONS: None for knee, pacemaker  RED FLAGS: None   WEIGHT BEARING RESTRICTIONS: No  FALLS:  Has patient fallen in last 6 months? No  LIVING ENVIRONMENT: Lives with: lives with their family Lives in: House/apartment Stairs: 2 Has following equipment at home: Environmental Consultant - 2 wheeled  OCCUPATION: not working  PLOF: Independent  PATIENT GOALS: To regain my knee function  NEXT MD VISIT: 10/13/24  OBJECTIVE:  Note: Objective measures were completed at Evaluation unless otherwise noted.  DIAGNOSTIC FINDINGS: none  PATIENT SURVEYS:  LEFS 39/80   MUSCLE LENGTH: N/T  POSTURE: flexed R knee  10/21/24 PALPATION: Good patellar mobility, no redness/warmth and minimal swelling/edema  LUMBAR ROM: Flexion: nil loss, L HS tighter than R Extension: 25% loss, central low back pain Side bend L: R sided low back pain Side bend R: nil loss, no symptoms  LOWER EXTREMITY ROM:  A/PROM Right eval Left eval RT 10/21/24 RT 10/26/24 RT 10/28/24 RT 11/02/24 RT 11/04/24 RT 11/09/24 RT 11/12/24 RT 11/18/2024 RT 11/30/2024 RLE 12/14/24  Hip flexion              Hip extension              Hip abduction              Hip adduction              Hip internal rotation              Hip external rotation              Knee flexion 55/60d  AAROM 80d 72 A tx  88 A, 95 A 100 AAROM 100 A,  110A 112 116/120 118 120 129  Knee extension -16/-6d  AROM 10d lacking 7    5 5    0  Ankle dorsiflexion              Ankle plantarflexion              Ankle inversion              Ankle eversion               (Blank rows = not tested)  LOWER EXTREMITY MMT:  MMT Right eval Left eval RLE 12/14/24  Hip flexion     Hip extension  Hip abduction     Hip adduction     Hip internal rotation     Hip external rotation     Knee flexion 3/5  4/5  Knee extension 3/5  4-/5  Ankle dorsiflexion     Ankle plantarflexion     Ankle inversion     Ankle eversion      (Blank rows = not tested)  LOWER EXTREMITY SPECIAL TESTS:  Deferred post-op  FUNCTIONAL TESTS:  30 seconds chair stand test 1 with UE assist  GAIT: Distance walked: 36ftx2 Assistive device utilized: Environmental Consultant - 2 wheeled Level of assistance: Modified independence Comments: slow cadence, step through pattern                                                                                                                           TREATMENT:  [facility] Adult PT Treatment:                                                DATE: 12/21/2024 Recumbent bike L1 x 6 min - lowering seat every 2 min Prone quad stretch with contract/ relax techniques 2 x 30 sec with 10 sec contraction MTPR for the piriformis with tack and stretch techniques using tennis ball Modified lung 1 x 10 to 6 inch step height using TRX for support, modified to 4 inch step height 1 x 10 Cues on proper knee position to reduce L knee pain and maximize efficient patellar tracking.  Sit to stand with green theraband around knees to maximize glute med/min activation 1 x 10  [facility] Adult PT Treatment:                                                DATE: 12/16/2024 Ellipitical L5 ramp L 5 x 5 min  Piriformis stretch 1 x 30 sec Getting onto and off of the floor using lunge technique (LLE leading) and mod cues/ demonstration for form Game ready x 10 min, medium pressure, 34  degrees  [facility] Adult PT Treatment:                                                DATE: 12/14/24 Therapeutic Exercise: Recumbent bike x6 mins Lateral step downs 2x10 BLE 3 inch step Paloff press with moderate resistance-stride stance BLE back x30  Therapeutic Activity: PT POC reviewed and discussed, goals assessed to reflect current status.    [facility] Adult PT Treatment:  DATE: 12/09/24 Therapeutic Exercise: Recumbent bike x6 mins  Repeated lumbar flexion in sitting x10: no effect on symptoms Sustained lumbar extension in prone prop x3 mins, supported Manual Therapy: Sciatic nn glide in supine with SLR, IV DF bias 3x15  Gait training: Pt able to complete gait training on level surface without AD demonstrating improved reciprocal pattern, improved arm swing and weight bearing both as distance progresses and as distractions are integrated. Pt encouraged to complete walking later in her routine after completing warm up, will modify home distance as tolerated.    PATIENT EDUCATION:  Education details: Discussed eval findings, rehab rationale and POC and patient is in agreement  Person educated: Patient and Spouse Education method: Explanation and Handouts Education comprehension: verbalized understanding and needs further education  HOME EXERCISE PROGRAM: Access Code: [email or web address] URL: [email or web address] Date: 12/04/2024 Prepared by: Stann Ohara  Program Notes forward / backward weight shifting with the right foot advanced forward. when rocking forward put weight on the right leg, when rocking backward rock on to the left leg and pull the toes up on the right foot and tighten your quad and straighten your knee.seated on high table/ surface, swinging the right and left knee. Swing R knee forward while left knee goes back and vice versa.   Exercises - Active Straight Leg Raise with Quad Set  - 1 x daily - 4 x weekly - 2 sets -  15 reps - 3 hold - Sit to Stand with Hands on Knees  - 1 x daily - 4 x weekly - 3 sets - 12 reps - Standing 3-way Hip with Walker  - 1 x daily - 4 x weekly - 1 sets - 10 reps - 2 hold - Heel Raises with Counter Support  - 1 x daily - 4 x weekly - 3 sets - 15 reps - 2 hold - Standing Knee Flexion with Counter Support  - 1 x daily - 4 x weekly - 2 sets - 10 reps - 5 sec hold - Prone Press Up On Elbows  - 2 x daily - 7 x weekly - 1 sets - 1 reps - 3-5 min hold ASSESSMENT:  CLINICAL IMPRESSION:  12/21/2024  pt arrives to session noting DOMS after prior session. Continued working on quad and glute activation to using TRX to assist with exercise and lowering to a controlled height. She did well with this but noted soreness in the L knee which she demonstrated increased genu valgum and after reposition and cueing to keep knee/ foot aligned and she noted relief of symptoms. Continued working gross glute activation with green theraband. Plan to continue progressing strengthening as tolerated.   EVAL: Patient is a 59 y.o. female who was seen today for physical therapy evaluation and treatment following R TKA.   Patient is approximately 2 weeks postop with Aquacel dressing in place without signs of redness drainage or excessive warmth noted at knee and around dressing edges.  Patient ambulates with a rolling walker using a step through gait with slow cadence and decreased step length bilaterally.  She is able to transfer out of a chair independently but requires upper extremity assist to place right lower extremity onto table.  Patient is extremely fearful of knee flexion due to pain levels.  Patient is able to demonstrate good quad control and facilitation with extension range of motion improving throughout session.  Knee flexion remain patient's main limitation as she was apprehensive about pain.  Patient is a good candidate for outpatient  rehab to regain right knee strength and function.  She has a 2-week postop  follow-up with her surgeons office and has been recommended to discuss optimum pain relieving techniques to allow more comfortable participation in rehab.  OBJECTIVE IMPAIRMENTS: Abnormal gait, decreased activity tolerance, decreased balance, decreased coordination, decreased endurance, decreased knowledge of condition, decreased mobility, difficulty walking, decreased ROM, decreased strength, increased fascial restrictions, impaired perceived functional ability, improper body mechanics, and pain.   ACTIVITY LIMITATIONS: carrying, lifting, sitting, standing, squatting, stairs, transfers, bed mobility, toileting, and locomotion level  PERSONAL FACTORS: Behavior pattern, Fitness, and Past/current experiences are also affecting patient's functional outcome.   REHAB POTENTIAL: Good  CLINICAL DECISION MAKING: Stable/uncomplicated  EVALUATION COMPLEXITY: Low   GOALS: Goals reviewed with patient? No  SHORT TERM GOALS: Target date: 11/09/2024   Patient to demonstrate independence in HEP  Baseline: [email or web address] Goal status: MET   2.  282ft ambulation with LRAD and appropriate gait pattern Baseline: 54ft with RW and step through pattern Goal status: MET  3.  90d AROM knee flexion Baseline: 55d Goal status: MET   LONG TERM GOALS: Target date: 12/07/2024    Patient will acknowledge 4/10 pain at least once during episode of care   Baseline: 8/10 12/14/24: 2/10 Goal status: MET   2.  Increase AROM of R knee to 115d flexion -5d extension Baseline:  A/PROM Right eval Left eval  Hip flexion    Hip extension    Hip abduction    Hip adduction    Hip internal rotation    Hip external rotation    Knee flexion 55/60d   Knee extension -16/-6d    Goal status: MET 12/15/24  3.  Increase R knee strength to 4/5 Baseline:  MMT Right eval Left eval  Hip flexion    Hip extension    Hip abduction    Hip adduction    Hip internal rotation    Hip external rotation    Knee flexion 3/5    Knee extension 3/5    Goal status: IN PROGRESS (12/14/24)  4.  Patient to negotiate 16 stairs with most appropriate pattern and assist. Baseline: TBD 12/14/24: dec tolerance to RLE advancement with steps Goal status: IN PROGRESS  5.  Patient will score at least 60/80 on LEFS to signify clinically meaningful improvement in functional abilities.   Baseline: 10/80 12/14/24: 24/80 Goal status: IN PROGRESS     PLAN:  PT FREQUENCY: 1-2x/week  PT DURATION: 8 weeks  PLANNED INTERVENTIONS: 97110-Therapeutic exercises, 97530- Therapeutic activity, 97112- Neuromuscular re-education, 97535- Self Care, [id number]- Manual therapy, [phone]- Gait training, Patient/Family education, Balance training, and Stair training  PLAN FOR NEXT SESSION: HEP review and update, manual techniques as appropriate, aerobic tasks, ROM and flexibility activities, strengthening and PREs, TPDN, gait and balance training,aquatic therapy,   Zavia Pullen PT, DPT, LAT, ATC  12/21/2024  12:07 PM               "

## 2024-12-23 ENCOUNTER — Ambulatory Visit: Admitting: Physical Therapy

## 2024-12-23 DIAGNOSIS — Z96651 Presence of right artificial knee joint: Secondary | ICD-10-CM

## 2024-12-23 DIAGNOSIS — M6281 Muscle weakness (generalized): Secondary | ICD-10-CM

## 2024-12-23 DIAGNOSIS — M25561 Pain in right knee: Secondary | ICD-10-CM | POA: Diagnosis not present

## 2024-12-23 DIAGNOSIS — R2689 Other abnormalities of gait and mobility: Secondary | ICD-10-CM

## 2024-12-23 NOTE — Therapy (Signed)
 " OUTPATIENT PHYSICAL THERAPY TREATMENT  Patient Name: Kayla Archer MRN: [id number] DOB:07-Jan-1966, 59 y.o., female Today's Date: 12/23/2024  END OF SESSION:  PT End of Session - 12/23/24 1110     Visit Number 18    Number of Visits 30    Date for Recertification  01/27/25    Authorization Type BC[employer]    PT Start Time 1109    PT Stop Time 1149    PT Time Calculation (min) 40 min    Activity Tolerance Patient tolerated treatment well    Behavior During Therapy WFL for tasks assessed/performed                           Past Medical History:  Diagnosis Date   Cartilage tear    rt knee   CHB (complete heart block) (HCC) 04/24/2017   Overview:  History: Post-op  Assessment: CHB with escape rate 50-60's. S/p PPM placement 05.22.2018.   PMW intact. On low-dose BB.   Plan: Continue low dose BB at discharge. Follow EP recommendations at discharge.   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 04/30/2017 06/24/2017   PONV (postoperative nausea and vomiting)    plus hypotension   Past Surgical History:  Procedure Laterality Date   CARDIAC VALVE REPLACEMENT  04   thoracic aortic aneurysm plus valve   INSERT / REPLACE / REMOVE PACEMAKER     at [facility] 04/2017 for complete heart block   KNEE ARTHROSCOPY Right 12/28/2013   Procedure: ARTHROSCOPY KNEE;  Surgeon: Marcey Raman, MD;  Location: [facility] OR;  Service: Orthopedics;  Laterality: Right;   LABIOPLASTY Bilateral 10/04/2016   Procedure: LABIAL REDUCTION;  Surgeon: Rosaline Cobble, MD;  Location: [facility] ORS;  Service: Gynecology;  Laterality: Bilateral;   TEE WITHOUT CARDIOVERSION N/A 02/08/2017   Procedure: TRANSESOPHAGEAL ECHOCARDIOGRAM (TEE);  Surgeon: Jerel Balding, MD;  Location: [facility] ENDOSCOPY;  Service: Cardiovascular;  Laterality: N/A;   VALVE REPLACEMENT  04/2017 [facility]    Patient Active Problem List   Diagnosis Date Noted   Acute medial meniscus tear of right knee 06/20/2023   Other tear of medial meniscus, current injury, left knee, subsequent encounter 06/20/2023   Heart disease 05/09/2021   Encounter for care of pacemaker 09/27/2019   Piriformis syndrome of left side 06/16/2019   H/O thoracic aortic aneurysm repair 02/14/2019   Other cytomegaloviral diseases (HCC) 02/14/2019   Vitamin D deficiency 02/14/2019   Degenerative arthritis of right knee 05/14/2018   Nontraumatic neck pain 10/25/2017   Nonallopathic lesion of cervical region 10/25/2017   Nonallopathic lesion of rib cage 10/25/2017   Hx of Lyme disease 07/15/2017   Pacemaker Medtronic 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 04/30/2017 06/24/2017   Atrial flutter (HCC) 05/02/2017   Transition of care performed with sharing of clinical summary 04/26/2017   CHB (complete heart block) (HCC) 04/24/2017   PFO (patent foramen ovale) 03/13/2017   Prosthetic aortic valve stenosis    Nonallopathic lesion of lumbosacral region 11/20/2016   Nonallopathic lesion of sacral region 11/20/2016   Nonallopathic lesion of thoracic region 11/20/2016   Left lumbar radiculopathy 11/13/2016   Left knee injury 03/29/2016   S/P arthroscopy of knee 11/02/2014   Mid sternal chest pain 03/31/2014   Aortic regurgitation 03/31/2014   S/P aortic valve replacement with bioprosthetic  valve 03/31/2014   Knee joint pain 10/20/2013   Vitiligo 08/09/2003    PCP: Jacques Camie Pepper, PA-C   REFERRING PROVIDER: Viann Jayson Raddle., MD  REFERRING DIAG: sTATUSW POST RIGHT KNEE  THERAPY DIAG:  Acute pain of right knee  Other abnormalities of gait and mobility  Muscle weakness (generalized)  History of arthroplasty of right knee  Rationale for Evaluation and Treatment: Rehabilitation  ONSET DATE: 09/29/24 DOS  SUBJECTIVE:   SUBJECTIVE STATEMENT: 12/23/2024 I was doing pretty good after the last session, I  think we found the sweet spot. I went to the gym and did an upright bike and felt like I undid all the good we did.   EVAL: Presents to OPPT following R TKA following long standing history of R knee pain and dysfunction and unsuccessful conservative measures.  Patient focused on pain level with reluctance to rely on pain meds for fear of reliance on them.    PERTINENT HISTORY: None available PAIN:  Are you having pain? Yes: NPRS scale: 2/10 ( took meds this AM for relief) Pain location: R knee Pain description: ache, throb, sharp Aggravating factors: movement, activity Relieving factors: rest, meds  PRECAUTIONS: None for knee, pacemaker  RED FLAGS: None   WEIGHT BEARING RESTRICTIONS: No  FALLS:  Has patient fallen in last 6 months? No  LIVING ENVIRONMENT: Lives with: lives with their family Lives in: House/apartment Stairs: 2 Has following equipment at home: Environmental Consultant - 2 wheeled  OCCUPATION: not working  PLOF: Independent  PATIENT GOALS: To regain my knee function  NEXT MD VISIT: 10/13/24  OBJECTIVE:  Note: Objective measures were completed at Evaluation unless otherwise noted.  DIAGNOSTIC FINDINGS: none  PATIENT SURVEYS:  LEFS 39/80   MUSCLE LENGTH: N/T  POSTURE: flexed R knee  10/21/24 PALPATION: Good patellar mobility, no redness/warmth and minimal swelling/edema  LUMBAR ROM: Flexion: nil loss, L HS tighter than R Extension: 25% loss, central low back pain Side bend L: R sided low back pain Side bend R: nil loss, no symptoms  LOWER EXTREMITY ROM:  A/PROM Right eval Left eval RT 10/21/24 RT 10/26/24 RT 10/28/24 RT 11/02/24 RT 11/04/24 RT 11/09/24 RT 11/12/24 RT 11/18/2024 RT 11/30/2024 RLE 12/14/24  Hip flexion              Hip extension              Hip abduction              Hip adduction              Hip internal rotation              Hip external rotation              Knee flexion 55/60d  AAROM 80d 72 A tx  88 A, 95 A 100 AAROM 100 A,  110A 112 116/120 118 120 129  Knee extension -16/-6d  AROM 10d lacking 7    5 5    0  Ankle dorsiflexion              Ankle plantarflexion              Ankle inversion              Ankle eversion               (Blank rows = not tested)  LOWER EXTREMITY MMT:  MMT Right eval Left eval RLE 12/14/24  Hip flexion     Hip  extension     Hip abduction     Hip adduction     Hip internal rotation     Hip external rotation     Knee flexion 3/5  4/5  Knee extension 3/5  4-/5  Ankle dorsiflexion     Ankle plantarflexion     Ankle inversion     Ankle eversion      (Blank rows = not tested)  LOWER EXTREMITY SPECIAL TESTS:  Deferred post-op  FUNCTIONAL TESTS:  30 seconds chair stand test 1 with UE assist  GAIT: Distance walked: 4ftx2 Assistive device utilized: Environmental Consultant - 2 wheeled Level of assistance: Modified independence Comments: slow cadence, step through pattern                                                                                                                           TREATMENT:  [facility] Adult PT Treatment:                                                DATE: 12/23/2024 Treadmill walking x 6 min 1.0 x 3 min 1.2 x 3 min Gait training utilizing heel strike and reciprocal arm swing with emphasis on reciprocal arms swing, starting with slow speed, and gradually increasing speed/ cadence Leg press 2 x 10 40# Reviewed interval walking routine beginning with faster pace for 30 sec and regular pace x 1 min and performing 4-5 rounds on a track rather than treadmill.  [facility] Adult PT Treatment:                                                DATE: 12/21/2024 Recumbent bike L1 x 6 min - lowering seat every 2 min Prone quad stretch with contract/ relax techniques 2 x 30 sec with 10 sec contraction MTPR for the piriformis with tack and stretch techniques using tennis ball Modified lung 1 x 10 to 6 inch step height using TRX for support, modified to 4 inch step height 1 x 10 Cues on  proper knee position to reduce L knee pain and maximize efficient patellar tracking.  Sit to stand with green theraband around knees to maximize glute med/min activation 1 x 10  [facility] Adult PT Treatment:                                                DATE: 12/16/2024 Ellipitical L5 ramp L 5 x 5 min  Piriformis stretch 1 x 30 sec Getting onto and off of the floor using lunge technique (LLE leading) and mod cues/ demonstration for form  Game ready x 10 min, medium pressure, 34 degrees  [facility] Adult PT Treatment:                                                DATE: 12/14/24 Therapeutic Exercise: Recumbent bike x6 mins Lateral step downs 2x10 BLE 3 inch step Paloff press with moderate resistance-stride stance BLE back x30  Therapeutic Activity: PT POC reviewed and discussed, goals assessed to reflect current status.    PATIENT EDUCATION:  Education details: Discussed eval findings, rehab rationale and POC and patient is in agreement  Person educated: Patient and Spouse Education method: Explanation and Handouts Education comprehension: verbalized understanding and needs further education  HOME EXERCISE PROGRAM: Access Code: [email or web address] URL: [email or web address] Date: 12/04/2024 Prepared by: Stann Ohara  Program Notes forward / backward weight shifting with the right foot advanced forward. when rocking forward put weight on the right leg, when rocking backward rock on to the left leg and pull the toes up on the right foot and tighten your quad and straighten your knee.seated on high table/ surface, swinging the right and left knee. Swing R knee forward while left knee goes back and vice versa.   Exercises - Active Straight Leg Raise with Quad Set  - 1 x daily - 4 x weekly - 2 sets - 15 reps - 3 hold - Sit to Stand with Hands on Knees  - 1 x daily - 4 x weekly - 3 sets - 12 reps - Standing 3-way Hip with Walker  - 1 x daily - 4 x weekly - 1 sets - 10 reps - 2 hold - Heel Raises  with Counter Support  - 1 x daily - 4 x weekly - 3 sets - 15 reps - 2 hold - Standing Knee Flexion with Counter Support  - 1 x daily - 4 x weekly - 2 sets - 10 reps - 5 sec hold - Prone Press Up On Elbows  - 2 x daily - 7 x weekly - 1 sets - 1 reps - 3-5 min hold ASSESSMENT:  CLINICAL IMPRESSION:  12/23/2024  Kayla Archer arrived to PT noting she was pleased with the last session noting she didn't have as significant post exercise soreness, but went to the gym and noted doing an upright bike and reported increased soreness that evening. Discussed she is continuing to progress her strength and endurance and she is 12 weeks out and still healing and adjusting. Focused session on gait training starting with slow pace and progressing cadence/ speed which she was apprehensive on but did very well and was able to perform with no issues regarding catching her foot, postural sway, or need of any support beyond supervision. She did very well with session today and continues to progress as tolerated.   EVAL: Patient is a 59 y.o. female who was seen today for physical therapy evaluation and treatment following R TKA.   Patient is approximately 2 weeks postop with Aquacel dressing in place without signs of redness drainage or excessive warmth noted at knee and around dressing edges.  Patient ambulates with a rolling walker using a step through gait with slow cadence and decreased step length bilaterally.  She is able to transfer out of a chair independently but requires upper extremity assist to place right lower extremity onto table.  Patient is extremely fearful of knee flexion  due to pain levels.  Patient is able to demonstrate good quad control and facilitation with extension range of motion improving throughout session.  Knee flexion remain patient's main limitation as she was apprehensive about pain.  Patient is a good candidate for outpatient rehab to regain right knee strength and function.  She has a 2-week postop  follow-up with her surgeons office and has been recommended to discuss optimum pain relieving techniques to allow more comfortable participation in rehab.  OBJECTIVE IMPAIRMENTS: Abnormal gait, decreased activity tolerance, decreased balance, decreased coordination, decreased endurance, decreased knowledge of condition, decreased mobility, difficulty walking, decreased ROM, decreased strength, increased fascial restrictions, impaired perceived functional ability, improper body mechanics, and pain.   ACTIVITY LIMITATIONS: carrying, lifting, sitting, standing, squatting, stairs, transfers, bed mobility, toileting, and locomotion level  PERSONAL FACTORS: Behavior pattern, Fitness, and Past/current experiences are also affecting patient's functional outcome.   REHAB POTENTIAL: Good  CLINICAL DECISION MAKING: Stable/uncomplicated  EVALUATION COMPLEXITY: Low   GOALS: Goals reviewed with patient? No  SHORT TERM GOALS: Target date: 11/09/2024   Patient to demonstrate independence in HEP  Baseline: [email or web address] Goal status: MET   2.  273ft ambulation with LRAD and appropriate gait pattern Baseline: 22ft with RW and step through pattern Goal status: MET  3.  90d AROM knee flexion Baseline: 55d Goal status: MET   LONG TERM GOALS: Target date: 12/07/2024    Patient will acknowledge 4/10 pain at least once during episode of care   Baseline: 8/10 12/14/24: 2/10 Goal status: MET   2.  Increase AROM of R knee to 115d flexion -5d extension Baseline:  A/PROM Right eval Left eval  Hip flexion    Hip extension    Hip abduction    Hip adduction    Hip internal rotation    Hip external rotation    Knee flexion 55/60d   Knee extension -16/-6d    Goal status: MET 12/15/24  3.  Increase R knee strength to 4/5 Baseline:  MMT Right eval Left eval  Hip flexion    Hip extension    Hip abduction    Hip adduction    Hip internal rotation    Hip external rotation    Knee flexion 3/5    Knee extension 3/5    Goal status: IN PROGRESS (12/14/24)  4.  Patient to negotiate 16 stairs with most appropriate pattern and assist. Baseline: TBD 12/14/24: dec tolerance to RLE advancement with steps Goal status: IN PROGRESS  5.  Patient will score at least 60/80 on LEFS to signify clinically meaningful improvement in functional abilities.   Baseline: 10/80 12/14/24: 24/80 Goal status: IN PROGRESS     PLAN:  PT FREQUENCY: 1-2x/week  PT DURATION: 8 weeks  PLANNED INTERVENTIONS: 97110-Therapeutic exercises, 97530- Therapeutic activity, 97112- Neuromuscular re-education, 97535- Self Care, [id number]- Manual therapy, [phone]- Gait training, Patient/Family education, Balance training, and Stair training  PLAN FOR NEXT SESSION: HEP review and update, manual techniques as appropriate, aerobic tasks, ROM and flexibility activities, strengthening and PREs, TPDN, gait and balance training,aquatic therapy,   Kayla Archer PT, DPT, LAT, ATC  12/23/2024  12:08 PM               "

## 2024-12-25 ENCOUNTER — Encounter (HOSPITAL_COMMUNITY): Payer: Self-pay | Admitting: Cardiology

## 2024-12-28 ENCOUNTER — Ambulatory Visit: Admitting: Physical Therapy

## 2024-12-28 DIAGNOSIS — M25561 Pain in right knee: Secondary | ICD-10-CM

## 2024-12-28 DIAGNOSIS — M6281 Muscle weakness (generalized): Secondary | ICD-10-CM

## 2024-12-28 DIAGNOSIS — R2689 Other abnormalities of gait and mobility: Secondary | ICD-10-CM

## 2024-12-28 NOTE — Therapy (Signed)
 " OUTPATIENT PHYSICAL THERAPY TREATMENT  Patient Name: Kayla Archer MRN: [id number] DOB:[birth year], 59 y.o., female Today's Date: 12/28/2024  END OF SESSION:  PT End of Session - 12/28/24 0932     Visit Number 19    Number of Visits 30    Date for Recertification  01/27/25    Authorization Type BCBS    PT Start Time 0932    PT Stop Time 1020    PT Time Calculation (min) 48 min    Activity Tolerance Patient tolerated treatment well    Behavior During Therapy [facility] for tasks assessed/performed                            Past Medical History:  Diagnosis Date   Cartilage tear    rt knee   CHB (complete heart block) (HCC) 04/24/2017   Overview:  History: Post-op  Assessment: CHB with escape rate 50-60's. S/p PPM placement 05.22.2018.   PMW intact. On low-dose BB.   Plan: Continue low dose BB at discharge. Follow EP recommendations at discharge.   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 04/30/2017 06/24/2017   PONV (postoperative nausea and vomiting)    plus hypotension   Past Surgical History:  Procedure Laterality Date   CARDIAC VALVE REPLACEMENT  04   thoracic aortic aneurysm plus valve   INSERT / REPLACE / REMOVE PACEMAKER     at [facility] 04/2017 for complete heart block   KNEE ARTHROSCOPY Right 12/28/2013   Procedure: ARTHROSCOPY KNEE;  Surgeon: Marcey Raman, MD;  Location: [facility] OR;  Service: Orthopedics;  Laterality: Right;   LABIOPLASTY Bilateral 10/04/2016   Procedure: LABIAL REDUCTION;  Surgeon: Rosaline Cobble, MD;  Location: [facility] ORS;  Service: Gynecology;  Laterality: Bilateral;   TEE WITHOUT CARDIOVERSION N/A 02/08/2017   Procedure: TRANSESOPHAGEAL ECHOCARDIOGRAM (TEE);  Surgeon: Jerel Balding, MD;  Location: [facility] ENDOSCOPY;  Service: Cardiovascular;  Laterality: N/A;   VALVE REPLACEMENT  04/2017 [facility]    Patient Active Problem List   Diagnosis Date Noted   Acute medial meniscus tear of right knee 06/20/2023   Other tear of medial meniscus, current injury, left knee, subsequent encounter 06/20/2023   Heart disease 05/09/2021   Encounter for care of pacemaker 09/27/2019   Piriformis syndrome of left side 06/16/2019   H/O thoracic aortic aneurysm repair 02/14/2019   Other cytomegaloviral diseases (HCC) 02/14/2019   Vitamin D deficiency 02/14/2019   Degenerative arthritis of right knee 05/14/2018   Nontraumatic neck pain 10/25/2017   Nonallopathic lesion of cervical region 10/25/2017   Nonallopathic lesion of rib cage 10/25/2017   Hx of Lyme disease 07/15/2017   Pacemaker Medtronic 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 04/30/2017 06/24/2017   Atrial flutter (HCC) 05/02/2017   Transition of care performed with sharing of clinical summary 04/26/2017   CHB (complete heart block) (HCC) 04/24/2017   PFO (patent foramen ovale) 03/13/2017   Prosthetic aortic valve stenosis    Nonallopathic lesion of lumbosacral region 11/20/2016   Nonallopathic lesion of sacral region 11/20/2016   Nonallopathic lesion of thoracic region 11/20/2016   Left lumbar radiculopathy 11/13/2016   Left knee injury 03/29/2016   S/P arthroscopy of knee 11/02/2014   Mid sternal chest pain 03/31/2014   Aortic regurgitation 03/31/2014   S/P aortic valve replacement with  bioprosthetic valve 03/31/2014   Knee joint pain 10/20/2013   Vitiligo 08/09/2003    PCP: Jacques Camie Pepper, PA-C   REFERRING PROVIDER: Viann Jayson Raddle., MD  REFERRING DIAG: STATUS POST RIGHT KNEE  THERAPY DIAG:  Acute pain of right knee  Other abnormalities of gait and mobility  Muscle weakness (generalized)  Rationale for Evaluation and Treatment: Rehabilitation  ONSET DATE: 09/29/24 DOS  SUBJECTIVE:   SUBJECTIVE STATEMENT: 12/28/2024 The last session was good, today my pain is a 2/10 which I think its more muscle  pain.   EVAL: Presents to OPPT following R TKA following long standing history of R knee pain and dysfunction and unsuccessful conservative measures.  Patient focused on pain level with reluctance to rely on pain meds for fear of reliance on them.    PERTINENT HISTORY: None available PAIN:  Are you having pain? Yes: NPRS scale: 2/10 ( took meds this AM for relief) Pain location: R knee Pain description: ache, throb, sharp Aggravating factors: movement, activity Relieving factors: rest, meds  PRECAUTIONS: None for knee, pacemaker  RED FLAGS: None   WEIGHT BEARING RESTRICTIONS: No  FALLS:  Has patient fallen in last 6 months? No  LIVING ENVIRONMENT: Lives with: lives with their family Lives in: House/apartment Stairs: 2 Has following equipment at home: Environmental Consultant - 2 wheeled  OCCUPATION: not working  PLOF: Independent  PATIENT GOALS: To regain my knee function  NEXT MD VISIT: 10/13/24  OBJECTIVE:  Note: Objective measures were completed at Evaluation unless otherwise noted.  DIAGNOSTIC FINDINGS: none  PATIENT SURVEYS:  LEFS 39/80   MUSCLE LENGTH: N/T  POSTURE: flexed R knee  10/21/24 PALPATION: Good patellar mobility, no redness/warmth and minimal swelling/edema  LUMBAR ROM: Flexion: nil loss, L HS tighter than R Extension: 25% loss, central low back pain Side bend L: R sided low back pain Side bend R: nil loss, no symptoms  LOWER EXTREMITY ROM:  A/PROM Right eval Left eval RT 10/21/24 RT 10/26/24 RT 10/28/24 RT 11/02/24 RT 11/04/24 RT 11/09/24 RT 11/12/24 RT 11/18/2024 RT 11/30/2024 RLE 12/14/24 R 12/28/2024  Hip flexion               Hip extension               Hip abduction               Hip adduction               Hip internal rotation               Hip external rotation               Knee flexion 55/60d  AAROM 80d 72 A tx  88 A, 95 A 100 AAROM 100 A, 110A 112 116/120 118 120 129 129  Knee extension -16/-6d  AROM 10d lacking 7    5 5    0    Ankle dorsiflexion               Ankle plantarflexion               Ankle inversion               Ankle eversion                (Blank rows = not tested)  LOWER EXTREMITY MMT:  MMT Right eval Left eval RLE 12/14/24  Hip flexion     Hip extension     Hip abduction  Hip adduction     Hip internal rotation     Hip external rotation     Knee flexion 3/5  4/5  Knee extension 3/5  4-/5  Ankle dorsiflexion     Ankle plantarflexion     Ankle inversion     Ankle eversion      (Blank rows = not tested)  LOWER EXTREMITY SPECIAL TESTS:  Deferred post-op  FUNCTIONAL TESTS:  30 seconds chair stand test 1 with UE assist  GAIT: Distance walked: 6ftx2 Assistive device utilized: Environmental Consultant - 2 wheeled Level of assistance: Modified independence Comments: slow cadence, step through pattern                                                                                                                           TREATMENT:  [facility] Adult PT Treatment:                                                DATE: 12/28/2024 Nu-step L 6 x 6 min LE only IASTM along the patellar tendon Tack and stretch of the quadriceps in prone Resisted walking 13# forward / backward Step up/ down on 8 inch step focusing on reciprocal rhythm 2 x 1:30 Standing hip hikes on RLE 2 x 10 Reviewed and updated HEP today.   [facility] Adult PT Treatment:                                                DATE: 12/23/2024 Treadmill walking x 6 min 1.0 x 3 min 1.2 x 3 min Gait training utilizing heel strike and reciprocal arm swing with emphasis on reciprocal arms swing, starting with slow speed, and gradually increasing speed/ cadence Leg press 2 x 10 40# Reviewed interval walking routine beginning with faster pace for 30 sec and regular pace x 1 min and performing 4-5 rounds on a track rather than treadmill.  [facility] Adult PT Treatment:                                                DATE: 12/21/2024 Recumbent bike L1 x 6 min - lowering  seat every 2 min Prone quad stretch with contract/ relax techniques 2 x 30 sec with 10 sec contraction MTPR for the piriformis with tack and stretch techniques using tennis ball Modified lung 1 x 10 to 6 inch step height using TRX for support, modified to 4 inch step height 1 x 10 Cues on proper knee position to reduce L knee pain and maximize efficient patellar tracking.  Sit to stand with green  theraband around knees to maximize glute med/min activation 1 x 10  [facility] Adult PT Treatment:                                                DATE: 12/16/2024 Ellipitical L5 ramp L 5 x 5 min  Piriformis stretch 1 x 30 sec Getting onto and off of the floor using lunge technique (LLE leading) and mod cues/ demonstration for form Game ready x 10 min, medium pressure, 34 degrees  PATIENT EDUCATION:  Education details: Discussed eval findings, rehab rationale and POC and patient is in agreement  Person educated: Patient and Spouse Education method: Explanation and Handouts Education comprehension: verbalized understanding and needs further education  HOME EXERCISE PROGRAM: Access Code: [email or web address] URL: [email or web address] Date: 12/28/2024 Prepared by: Joneen Fresh  Program Notes forward / backward weight shifting with the right foot advanced forward. when rocking forward put weight on the right leg, when rocking backward rock on to the left leg and pull the toes up on the right foot and tighten your quad and straighten your knee.seated on high table/ surface, swinging the right and left knee. Swing R knee forward while left knee goes back and vice versa.   Exercises - Active Straight Leg Raise with Quad Set  - 1 x daily - 4 x weekly - 2 sets - 15 reps - 3 hold - Sit to Stand with Hands on Knees  - 1 x daily - 4 x weekly - 3 sets - 12 reps - Standing 3-way Hip with Walker  - 1 x daily - 4 x weekly - 1 sets - 10 reps - 2 hold - Heel Raises with Counter Support  - 1 x daily - 4 x weekly  - 3 sets - 15 reps - 2 hold - Standing Knee Flexion with Counter Support  - 1 x daily - 4 x weekly - 2 sets - 10 reps - 5 sec hold - Prone Press Up On Elbows  - 2 x daily - 7 x weekly - 1 sets - 1 reps - 3-5 min hold - Hip Hiking on Step  - 1 x daily - 7 x weekly - 3 sets - 10 -15 reps  ASSESSMENT:  CLINICAL IMPRESSION:  12/28/2024 Murna arrives to session noting she did well after the previous session and had been compliant with her HEP of walking interval training. She does continue to fatigue with prolong periods of activity noting soreness more towards the end of the day. She is doing well at maintaining her knee flexion at 129 degrees and is improving her gait biomechanics. Continued working on gross LE strengthening and gait biomechanics with forward /backward walking as well as step ups/ downs. Continue to progress strength and endurance as tolerated.   EVAL: Patient is a 59 y.o. female who was seen today for physical therapy evaluation and treatment following R TKA.   Patient is approximately 2 weeks postop with Aquacel dressing in place without signs of redness drainage or excessive warmth noted at knee and around dressing edges.  Patient ambulates with a rolling walker using a step through gait with slow cadence and decreased step length bilaterally.  She is able to transfer out of a chair independently but requires upper extremity assist to place right lower extremity onto table.  Patient is extremely fearful of knee flexion due to  pain levels.  Patient is able to demonstrate good quad control and facilitation with extension range of motion improving throughout session.  Knee flexion remain patient's main limitation as she was apprehensive about pain.  Patient is a good candidate for outpatient rehab to regain right knee strength and function.  She has a 2-week postop follow-up with her surgeons office and has been recommended to discuss optimum pain relieving techniques to allow more  comfortable participation in rehab.  OBJECTIVE IMPAIRMENTS: Abnormal gait, decreased activity tolerance, decreased balance, decreased coordination, decreased endurance, decreased knowledge of condition, decreased mobility, difficulty walking, decreased ROM, decreased strength, increased fascial restrictions, impaired perceived functional ability, improper body mechanics, and pain.   ACTIVITY LIMITATIONS: carrying, lifting, sitting, standing, squatting, stairs, transfers, bed mobility, toileting, and locomotion level  PERSONAL FACTORS: Behavior pattern, Fitness, and Past/current experiences are also affecting patient's functional outcome.   REHAB POTENTIAL: Good  CLINICAL DECISION MAKING: Stable/uncomplicated  EVALUATION COMPLEXITY: Low   GOALS: Goals reviewed with patient? No  SHORT TERM GOALS: Target date: 11/09/2024   Patient to demonstrate independence in HEP  Baseline: [email or web address] Goal status: MET   2.  261ft ambulation with LRAD and appropriate gait pattern Baseline: 42ft with RW and step through pattern Goal status: MET  3.  90d AROM knee flexion Baseline: 55d Goal status: MET   LONG TERM GOALS: Target date: 12/07/2024    Patient will acknowledge 4/10 pain at least once during episode of care   Baseline: 8/10 12/14/24: 2/10 Goal status: MET   2.  Increase AROM of R knee to 115d flexion -5d extension Baseline:  A/PROM Right eval Left eval  Hip flexion    Hip extension    Hip abduction    Hip adduction    Hip internal rotation    Hip external rotation    Knee flexion 55/60d   Knee extension -16/-6d    Goal status: MET 12/15/24  3.  Increase R knee strength to 4/5 Baseline:  MMT Right eval Left eval  Hip flexion    Hip extension    Hip abduction    Hip adduction    Hip internal rotation    Hip external rotation    Knee flexion 3/5   Knee extension 3/5    Goal status: IN PROGRESS (12/14/24)  4.  Patient to negotiate 16 stairs with most appropriate  pattern and assist. Baseline: TBD 12/14/24: dec tolerance to RLE advancement with steps Goal status: IN PROGRESS  5.  Patient will score at least 60/80 on LEFS to signify clinically meaningful improvement in functional abilities.   Baseline: 10/80 12/14/24: 24/80 Goal status: IN PROGRESS     PLAN:  PT FREQUENCY: 1-2x/week  PT DURATION: 8 weeks  PLANNED INTERVENTIONS: 97110-Therapeutic exercises, 97530- Therapeutic activity, 97112- Neuromuscular re-education, 97535- Self Care, [id number]- Manual therapy, [phone]- Gait training, Patient/Family education, Balance training, and Stair training  PLAN FOR NEXT SESSION: HEP review and update, manual techniques as appropriate, aerobic tasks, ROM and flexibility activities, strengthening and PREs, TPDN, gait and balance training,aquatic therapy,   Keonda Dow PT, DPT, LAT, ATC  12/28/24  10:31 AM               "

## 2024-12-31 ENCOUNTER — Ambulatory Visit: Admitting: Physical Therapy

## 2024-12-31 ENCOUNTER — Encounter: Payer: Self-pay | Admitting: Physical Therapy

## 2024-12-31 DIAGNOSIS — M25561 Pain in right knee: Secondary | ICD-10-CM | POA: Diagnosis not present

## 2024-12-31 DIAGNOSIS — R2689 Other abnormalities of gait and mobility: Secondary | ICD-10-CM

## 2024-12-31 DIAGNOSIS — M6281 Muscle weakness (generalized): Secondary | ICD-10-CM

## 2024-12-31 NOTE — Therapy (Signed)
 " OUTPATIENT PHYSICAL THERAPY TREATMENT  Patient Name: Kayla Archer MRN: [id number] DOB:02/10/1966, 59 y.o., female Today's Date: 12/31/2024  END OF SESSION:  PT End of Session - 12/31/24 1105     Visit Number 20    Number of Visits 30    Date for Recertification  01/27/25    Authorization Type BCBS    PT Start Time 1104    PT Stop Time 1148    PT Time Calculation (min) 44 min    Activity Tolerance Patient tolerated treatment well    Behavior During Therapy WFL for tasks assessed/performed                             Past Medical History:  Diagnosis Date   Cartilage tear    rt knee   CHB (complete heart block) (HCC) 04/24/2017   Overview:  History: Post-op  Assessment: CHB with escape rate 50-60's. S/p PPM placement 05.22.2018.   PMW intact. On low-dose BB.   Plan: Continue low dose BB at discharge. Follow EP recommendations at discharge.   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 04/30/2017 06/24/2017   PONV (postoperative nausea and vomiting)    plus hypotension   Past Surgical History:  Procedure Laterality Date   CARDIAC VALVE REPLACEMENT  04   thoracic aortic aneurysm plus valve   INSERT / REPLACE / REMOVE PACEMAKER     at [facility] 04/2017 for complete heart block   KNEE ARTHROSCOPY Right 12/28/2013   Procedure: ARTHROSCOPY KNEE;  Surgeon: Marcey Raman, MD;  Location: [facility] OR;  Service: Orthopedics;  Laterality: Right;   LABIOPLASTY Bilateral 10/04/2016   Procedure: LABIAL REDUCTION;  Surgeon: Rosaline Cobble, MD;  Location: [facility] ORS;  Service: Gynecology;  Laterality: Bilateral;   TEE WITHOUT CARDIOVERSION N/A 02/08/2017   Procedure: TRANSESOPHAGEAL ECHOCARDIOGRAM (TEE);  Surgeon: Jerel Balding, MD;  Location: [facility];  Service: Cardiovascular;  Laterality: N/A;   VALVE REPLACEMENT  04/2017 [facility]    Patient Active Problem List   Diagnosis Date Noted   Acute medial meniscus tear of right knee 06/20/2023   Other tear of medial meniscus, current injury, left knee, subsequent encounter 06/20/2023   Heart disease 05/09/2021   Encounter for care of pacemaker 09/27/2019   Piriformis syndrome of left side 06/16/2019   H/O thoracic aortic aneurysm repair 02/14/2019   Other cytomegaloviral diseases (HCC) 02/14/2019   Vitamin D deficiency 02/14/2019   Degenerative arthritis of right knee 05/14/2018   Nontraumatic neck pain 10/25/2017   Nonallopathic lesion of cervical region 10/25/2017   Nonallopathic lesion of rib cage 10/25/2017   Hx of Lyme disease 07/15/2017   Pacemaker Medtronic 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 04/30/2017 06/24/2017   Atrial flutter (HCC) 05/02/2017   Transition of care performed with sharing of clinical summary 04/26/2017   CHB (complete heart block) (HCC) 04/24/2017   PFO (patent foramen ovale) 03/13/2017   Prosthetic aortic valve stenosis    Nonallopathic lesion of lumbosacral region 11/20/2016   Nonallopathic lesion of sacral region 11/20/2016   Nonallopathic lesion of thoracic region 11/20/2016   Left lumbar radiculopathy 11/13/2016   Left knee injury 03/29/2016   S/P arthroscopy of knee 11/02/2014   Mid sternal chest pain 03/31/2014   Aortic regurgitation 03/31/2014   S/P aortic valve replacement  with bioprosthetic valve 03/31/2014   Knee joint pain 10/20/2013   Vitiligo 08/09/2003    PCP: Jacques Camie Pepper, PA-C   REFERRING PROVIDER: Viann Jayson Raddle., MD  REFERRING DIAG: STATUS POST RIGHT KNEE  THERAPY DIAG:  Acute pain of right knee  Other abnormalities of gait and mobility  Muscle weakness (generalized)  Rationale for Evaluation and Treatment: Rehabilitation  ONSET DATE: 09/29/24 DOS  SUBJECTIVE:   SUBJECTIVE STATEMENT: 12/31/2024  I think we found the sweet spot, I was sore but didn't fee like I did any damage.   EVAL:  Presents to OPPT following R TKA following long standing history of R knee pain and dysfunction and unsuccessful conservative measures.  Patient focused on pain level with reluctance to rely on pain meds for fear of reliance on them.    PERTINENT HISTORY: None available PAIN:  Are you having pain? Yes: NPRS scale: 2/10 ( took meds this AM for relief) Pain location: R knee Pain description: ache, throb, sharp Aggravating factors: movement, activity Relieving factors: rest, meds  PRECAUTIONS: None for knee, pacemaker  RED FLAGS: None   WEIGHT BEARING RESTRICTIONS: No  FALLS:  Has patient fallen in last 6 months? No  LIVING ENVIRONMENT: Lives with: lives with their family Lives in: House/apartment Stairs: 2 Has following equipment at home: Environmental Consultant - 2 wheeled  OCCUPATION: not working  PLOF: Independent  PATIENT GOALS: To regain my knee function  NEXT MD VISIT: 10/13/24  OBJECTIVE:  Note: Objective measures were completed at Evaluation unless otherwise noted.  DIAGNOSTIC FINDINGS: none  PATIENT SURVEYS:  LEFS 39/80   MUSCLE LENGTH: N/T  POSTURE: flexed R knee  10/21/24 PALPATION: Good patellar mobility, no redness/warmth and minimal swelling/edema  LUMBAR ROM: Flexion: nil loss, L HS tighter than R Extension: 25% loss, central low back pain Side bend L: R sided low back pain Side bend R: nil loss, no symptoms  LOWER EXTREMITY ROM:  A/PROM Right eval Left eval RT 10/21/24 RT 10/26/24 RT 10/28/24 RT 11/02/24 RT 11/04/24 RT 11/09/24 RT 11/12/24 RT 11/18/2024 RT 11/30/2024 RLE 12/14/24 R 12/28/2024  Hip flexion               Hip extension               Hip abduction               Hip adduction               Hip internal rotation               Hip external rotation               Knee flexion 55/60d  AAROM 80d 72 A tx  88 A, 95 A 100 AAROM 100 A, 110A 112 116/120 118 120 129 129  Knee extension -16/-6d  AROM 10d lacking 7    5 5    0   Ankle  dorsiflexion               Ankle plantarflexion               Ankle inversion               Ankle eversion                (Blank rows = not tested)  LOWER EXTREMITY MMT:  MMT Right eval Left eval RLE 12/14/24  Hip flexion     Hip extension     Hip abduction  Hip adduction     Hip internal rotation     Hip external rotation     Knee flexion 3/5  4/5  Knee extension 3/5  4-/5  Ankle dorsiflexion     Ankle plantarflexion     Ankle inversion     Ankle eversion      (Blank rows = not tested)  LOWER EXTREMITY SPECIAL TESTS:  Deferred post-op  FUNCTIONAL TESTS:  30 seconds chair stand test 1 with UE assist  GAIT: Distance walked: 54ftx2 Assistive device utilized: Environmental Consultant - 2 wheeled Level of assistance: Modified independence Comments: slow cadence, step through pattern                                                                                                                           TREATMENT:  [facility] Adult PT Treatment:                                                DATE: 12/31/2024 MTPR along VMO Low load long duration lateral patellar glide Elliptical L 1 , ramp L1 x 4 min Step downs 1 x 10, cues for foot positioning to reduce R medial knee pain  Step downs to focus on keeping pelvic height level with mirror for visual feedback Gait training with emphasis on hip extension/ push up Transfer from sitting in car and getting out to reduce R knee aggravation   [facility] Adult PT Treatment:                                                DATE: 12/28/2024 Nu-step L 6 x 6 min LE only IASTM along the patellar tendon Tack and stretch of the quadriceps in prone Resisted walking 13# forward / backward Step up/ down on 8 inch step focusing on reciprocal rhythm 2 x 1:30 Standing hip hikes on RLE 2 x 10 Reviewed and updated HEP today.   [facility] Adult PT Treatment:                                                DATE: 12/23/2024 Treadmill walking x 6 min 1.0 x 3 min 1.2 x 3 min Gait  training utilizing heel strike and reciprocal arm swing with emphasis on reciprocal arms swing, starting with slow speed, and gradually increasing speed/ cadence Leg press 2 x 10 40# Reviewed interval walking routine beginning with faster pace for 30 sec and regular pace x 1 min and performing 4-5 rounds on a track rather than treadmill.  [facility] Adult PT Treatment:  DATE: 12/21/2024 Recumbent bike L1 x 6 min - lowering seat every 2 min Prone quad stretch with contract/ relax techniques 2 x 30 sec with 10 sec contraction MTPR for the piriformis with tack and stretch techniques using tennis ball Modified lung 1 x 10 to 6 inch step height using TRX for support, modified to 4 inch step height 1 x 10 Cues on proper knee position to reduce L knee pain and maximize efficient patellar tracking.  Sit to stand with green theraband around knees to maximize glute med/min activation 1 x 10  PATIENT EDUCATION:  Education details: Discussed eval findings, rehab rationale and POC and patient is in agreement  Person educated: Patient and Spouse Education method: Explanation and Handouts Education comprehension: verbalized understanding and needs further education  HOME EXERCISE PROGRAM: Access Code: [email or web address] URL: [email or web address] Date: 12/28/2024 Prepared by: Joneen Fresh  Program Notes forward / backward weight shifting with the right foot advanced forward. when rocking forward put weight on the right leg, when rocking backward rock on to the left leg and pull the toes up on the right foot and tighten your quad and straighten your knee.seated on high table/ surface, swinging the right and left knee. Swing R knee forward while left knee goes back and vice versa.   Exercises - Active Straight Leg Raise with Quad Set  - 1 x daily - 4 x weekly - 2 sets - 15 reps - 3 hold - Sit to Stand with Hands on Knees  - 1 x daily - 4 x weekly - 3 sets -  12 reps - Standing 3-way Hip with Walker  - 1 x daily - 4 x weekly - 1 sets - 10 reps - 2 hold - Heel Raises with Counter Support  - 1 x daily - 4 x weekly - 3 sets - 15 reps - 2 hold - Standing Knee Flexion with Counter Support  - 1 x daily - 4 x weekly - 2 sets - 10 reps - 5 sec hold - Prone Press Up On Elbows  - 2 x daily - 7 x weekly - 1 sets - 1 reps - 3-5 min hold - Hip Hiking on Step  - 1 x daily - 7 x weekly - 3 sets - 10 -15 reps  ASSESSMENT:  CLINICAL IMPRESSION:  12/31/2024 Debbe arrives to PT noting continued benefit from prior sessions. Focused on step up/ down as well and glute med activation to maintain / sustained pelvic heights. Additionally worked on investment banker, operational with emphasis on push off and glute activation, and transition from sitting in the car to getting out of the car. End of session she noted feeling pretty good and no medial knee aggravation.   EVAL: Patient is a 59 y.o. female who was seen today for physical therapy evaluation and treatment following R TKA.   Patient is approximately 2 weeks postop with Aquacel dressing in place without signs of redness drainage or excessive warmth noted at knee and around dressing edges.  Patient ambulates with a rolling walker using a step through gait with slow cadence and decreased step length bilaterally.  She is able to transfer out of a chair independently but requires upper extremity assist to place right lower extremity onto table.  Patient is extremely fearful of knee flexion due to pain levels.  Patient is able to demonstrate good quad control and facilitation with extension range of motion improving throughout session.  Knee flexion remain patient's main limitation as she was apprehensive  about pain.  Patient is a good candidate for outpatient rehab to regain right knee strength and function.  She has a 2-week postop follow-up with her surgeons office and has been recommended to discuss optimum pain relieving techniques to allow  more comfortable participation in rehab.  OBJECTIVE IMPAIRMENTS: Abnormal gait, decreased activity tolerance, decreased balance, decreased coordination, decreased endurance, decreased knowledge of condition, decreased mobility, difficulty walking, decreased ROM, decreased strength, increased fascial restrictions, impaired perceived functional ability, improper body mechanics, and pain.   ACTIVITY LIMITATIONS: carrying, lifting, sitting, standing, squatting, stairs, transfers, bed mobility, toileting, and locomotion level  PERSONAL FACTORS: Behavior pattern, Fitness, and Past/current experiences are also affecting patient's functional outcome.   REHAB POTENTIAL: Good  CLINICAL DECISION MAKING: Stable/uncomplicated  EVALUATION COMPLEXITY: Low   GOALS: Goals reviewed with patient? No  SHORT TERM GOALS: Target date: 11/09/2024   Patient to demonstrate independence in HEP  Baseline: [email or web address] Goal status: MET   2.  23ft ambulation with LRAD and appropriate gait pattern Baseline: 19ft with RW and step through pattern Goal status: MET  3.  90d AROM knee flexion Baseline: 55d Goal status: MET   LONG TERM GOALS: Target date: 12/07/2024    Patient will acknowledge 4/10 pain at least once during episode of care   Baseline: 8/10 12/14/24: 2/10 Goal status: MET   2.  Increase AROM of R knee to 115d flexion -5d extension Baseline:  A/PROM Right eval Left eval  Hip flexion    Hip extension    Hip abduction    Hip adduction    Hip internal rotation    Hip external rotation    Knee flexion 55/60d   Knee extension -16/-6d    Goal status: MET 12/15/24  3.  Increase R knee strength to 4/5 Baseline:  MMT Right eval Left eval  Hip flexion    Hip extension    Hip abduction    Hip adduction    Hip internal rotation    Hip external rotation    Knee flexion 3/5   Knee extension 3/5    Goal status: IN PROGRESS (12/14/24)  4.  Patient to negotiate 16 stairs with most appropriate  pattern and assist. Baseline: TBD 12/14/24: dec tolerance to RLE advancement with steps Goal status: IN PROGRESS  5.  Patient will score at least 60/80 on LEFS to signify clinically meaningful improvement in functional abilities.   Baseline: 10/80 12/14/24: 24/80 Goal status: IN PROGRESS     PLAN:  PT FREQUENCY: 1-2x/week  PT DURATION: 8 weeks  PLANNED INTERVENTIONS: 97110-Therapeutic exercises, 97530- Therapeutic activity, 97112- Neuromuscular re-education, 97535- Self Care, [id number]- Manual therapy, [phone]- Gait training, Patient/Family education, Balance training, and Stair training  PLAN FOR NEXT SESSION: HEP review and update, manual techniques as appropriate, aerobic tasks, ROM and flexibility activities, strengthening and PREs, TPDN, gait and balance training,aquatic therapy,   Latavious Bitter PT, DPT, LAT, ATC  12/31/24  11:58 AM               "

## 2025-01-04 ENCOUNTER — Ambulatory Visit: Admitting: Physical Therapy

## 2025-01-07 ENCOUNTER — Ambulatory Visit: Admitting: Physical Therapy

## 2025-01-07 DIAGNOSIS — Z96651 Presence of right artificial knee joint: Secondary | ICD-10-CM

## 2025-01-07 DIAGNOSIS — M6281 Muscle weakness (generalized): Secondary | ICD-10-CM

## 2025-01-07 DIAGNOSIS — M25561 Pain in right knee: Secondary | ICD-10-CM

## 2025-01-07 DIAGNOSIS — R2689 Other abnormalities of gait and mobility: Secondary | ICD-10-CM

## 2025-01-07 NOTE — Therapy (Signed)
 " OUTPATIENT PHYSICAL THERAPY TREATMENT  Patient Name: Kayla Archer MRN: [id number] DOB:11-Apr-1966, 59 y.o., female Today's Date: 01/07/2025  END OF SESSION:  PT End of Session - 01/07/25 1420     Visit Number 21    Number of Visits 30    Date for Recertification  01/27/25    Authorization Type [employer]    PT Start Time 1419    PT Stop Time 1515    PT Time Calculation (min) 56 min    Activity Tolerance Patient tolerated treatment well    Behavior During Therapy WFL for tasks assessed/performed                              Past Medical History:  Diagnosis Date   Cartilage tear    rt knee   CHB (complete heart block) (HCC) 04/24/2017   Overview:  History: Post-op  Assessment: CHB with escape rate 50-60's. S/p PPM placement 05.22.2018.   PMW intact. On low-dose BB.   Plan: Continue low dose BB at discharge. Follow EP recommendations at discharge.   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 04/30/2017 06/24/2017   PONV (postoperative nausea and vomiting)    plus hypotension   Past Surgical History:  Procedure Laterality Date   CARDIAC VALVE REPLACEMENT  04   thoracic aortic aneurysm plus valve   INSERT / REPLACE / REMOVE PACEMAKER     at [facility] 04/2017 for complete heart block   KNEE ARTHROSCOPY Right 12/28/2013   Procedure: ARTHROSCOPY KNEE;  Surgeon: Marcey Raman, MD;  Location: [facility] OR;  Service: Orthopedics;  Laterality: Right;   LABIOPLASTY Bilateral 10/04/2016   Procedure: LABIAL REDUCTION;  Surgeon: Rosaline Cobble, MD;  Location: [facility] ORS;  Service: Gynecology;  Laterality: Bilateral;   TEE WITHOUT CARDIOVERSION N/A 02/08/2017   Procedure: TRANSESOPHAGEAL ECHOCARDIOGRAM (TEE);  Surgeon: Jerel Balding, MD;  Location: [facility] ENDOSCOPY;  Service: Cardiovascular;  Laterality: N/A;   VALVE REPLACEMENT  04/2017 [facility]    Patient Active Problem List   Diagnosis Date Noted   Acute medial meniscus tear of right knee 06/20/2023   Other tear of medial meniscus, current injury, left knee, subsequent encounter 06/20/2023   Heart disease 05/09/2021   Encounter for care of pacemaker 09/27/2019   Piriformis syndrome of left side 06/16/2019   H/O thoracic aortic aneurysm repair 02/14/2019   Other cytomegaloviral diseases (HCC) 02/14/2019   Vitamin D deficiency 02/14/2019   Degenerative arthritis of right knee 05/14/2018   Nontraumatic neck pain 10/25/2017   Nonallopathic lesion of cervical region 10/25/2017   Nonallopathic lesion of rib cage 10/25/2017   Hx of Lyme disease 07/15/2017   Pacemaker Medtronic 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 04/30/2017 06/24/2017   Atrial flutter (HCC) 05/02/2017   Transition of care performed with sharing of clinical summary 04/26/2017   CHB (complete heart block) (HCC) 04/24/2017   PFO (patent foramen ovale) 03/13/2017   Prosthetic aortic valve stenosis    Nonallopathic lesion of lumbosacral region 11/20/2016   Nonallopathic lesion of sacral region 11/20/2016   Nonallopathic lesion of thoracic region 11/20/2016   Left lumbar radiculopathy 11/13/2016   Left knee injury 03/29/2016   S/P arthroscopy of knee 11/02/2014   Mid sternal chest pain 03/31/2014   Aortic regurgitation 03/31/2014   S/P aortic valve  replacement with bioprosthetic valve 03/31/2014   Knee joint pain 10/20/2013   Vitiligo 08/09/2003    PCP: Jacques Camie Pepper, PA-C   REFERRING PROVIDER: Viann Jayson Raddle., MD  REFERRING DIAG: STATUS POST RIGHT KNEE  THERAPY DIAG:  Acute pain of right knee  Other abnormalities of gait and mobility  Muscle weakness (generalized)  History of arthroplasty of right knee  Rationale for Evaluation and Treatment: Rehabilitation  ONSET DATE: 09/29/24 DOS  SUBJECTIVE:   SUBJECTIVE STATEMENT: 01/07/2025  I left Thursday and everything was good and Friday I  was good on Friday until 1pm. I took some tylenol . By Saturday I couldn't walk on the inside of my R knee, it was only when I was weight bearing. I got dry needled on Tuesday and it helped.   EVAL: Presents to OPPT following R TKA following long standing history of R knee pain and dysfunction and unsuccessful conservative measures.  Patient focused on pain level with reluctance to rely on pain meds for fear of reliance on them.    PERTINENT HISTORY: None available PAIN:  Are you having pain? Yes: NPRS scale: 2/10 ( took meds this AM for relief) Pain location: R knee Pain description: ache, throb, sharp Aggravating factors: movement, activity Relieving factors: rest, meds  PRECAUTIONS: None for knee, pacemaker  RED FLAGS: None   WEIGHT BEARING RESTRICTIONS: No  FALLS:  Has patient fallen in last 6 months? No  LIVING ENVIRONMENT: Lives with: lives with their family Lives in: House/apartment Stairs: 2 Has following equipment at home: Environmental Consultant - 2 wheeled  OCCUPATION: not working  PLOF: Independent  PATIENT GOALS: To regain my knee function  NEXT MD VISIT: 10/13/24  OBJECTIVE:  Note: Objective measures were completed at Evaluation unless otherwise noted.  DIAGNOSTIC FINDINGS: none  PATIENT SURVEYS:  LEFS 39/80   MUSCLE LENGTH: N/T  POSTURE: flexed R knee  10/21/24 PALPATION: Good patellar mobility, no redness/warmth and minimal swelling/edema  LUMBAR ROM: Flexion: nil loss, L HS tighter than R Extension: 25% loss, central low back pain Side bend L: R sided low back pain Side bend R: nil loss, no symptoms  LOWER EXTREMITY ROM:  A/PROM Right eval Left eval RT 10/21/24 RT 10/26/24 RT 10/28/24 RT 11/02/24 RT 11/04/24 RT 11/09/24 RT 11/12/24 RT 11/18/2024 RT 11/30/2024 RLE 12/14/24 R 12/28/2024 R  Hip flexion                Hip extension                Hip abduction                Hip adduction                Hip internal rotation                Hip  external rotation                Knee flexion 55/60d  AAROM 80d 72 A tx  88 A, 95 A 100 AAROM 100 A, 110A 112 116/120 118 120 129 129 130  Knee extension -16/-6d  AROM 10d lacking 7    5 5    0    Ankle dorsiflexion                Ankle plantarflexion                Ankle inversion  Ankle eversion                 (Blank rows = not tested)  LOWER EXTREMITY MMT:  MMT Right eval Left eval RLE 12/14/24  Hip flexion     Hip extension     Hip abduction     Hip adduction     Hip internal rotation     Hip external rotation     Knee flexion 3/5  4/5  Knee extension 3/5  4-/5  Ankle dorsiflexion     Ankle plantarflexion     Ankle inversion     Ankle eversion      (Blank rows = not tested)  LOWER EXTREMITY SPECIAL TESTS:  Deferred post-op  FUNCTIONAL TESTS:  30 seconds chair stand test 1 with UE assist  GAIT: Distance walked: 69ftx2 Assistive device utilized: Environmental Consultant - 2 wheeled Level of assistance: Modified independence Comments: slow cadence, step through pattern                                                                                                                           TREATMENT:  [facility] Adult PT Treatment:                                                DATE: 01/07/2025 Low load long duration lateral glide patellar stretch LAQ 2 x going to fatigue 5# with foot everted  Sidelying hip abduction 2 x 10 with 5# Attempted HIP ER strengthening but halted due to medial knee pain Seated hip IR with YTB 2 x 15 Standing marching in place with YTB around feet alternating L/R Updated HEP  [facility] Adult PT Treatment:                                                DATE: 12/31/2024 MTPR along VMO Low load long duration lateral patellar glide Elliptical L 1 , ramp L1 x 4 min Step downs 1 x 10, cues for foot positioning to reduce R medial knee pain  Step downs to focus on keeping pelvic height level with mirror for visual feedback Gait training with emphasis on hip  extension/ push up Transfer from sitting in car and getting out to reduce R knee aggravation   [facility] Adult PT Treatment:                                                DATE: 12/28/2024 Nu-step L 6 x 6 min LE only IASTM along the patellar tendon Tack and stretch of the quadriceps in prone Resisted walking 13#  forward / backward Step up/ down on 8 inch step focusing on reciprocal rhythm 2 x 1:30 Standing hip hikes on RLE 2 x 10 Reviewed and updated HEP today.   [facility] Adult PT Treatment:                                                DATE: 12/23/2024 Treadmill walking x 6 min 1.0 x 3 min 1.2 x 3 min Gait training utilizing heel strike and reciprocal arm swing with emphasis on reciprocal arms swing, starting with slow speed, and gradually increasing speed/ cadence Leg press 2 x 10 40# Reviewed interval walking routine beginning with faster pace for 30 sec and regular pace x 1 min and performing 4-5 rounds on a track rather than treadmill.  PATIENT EDUCATION:  Education details: Discussed eval findings, rehab rationale and POC and patient is in agreement  Person educated: Patient and Spouse Education method: Explanation and Handouts Education comprehension: verbalized understanding and needs further education  HOME EXERCISE PROGRAM: Access Code: [email or web address] URL: [email or web address] Date: 12/28/2024 Prepared by: Joneen Fresh  Program Notes forward / backward weight shifting with the right foot advanced forward. when rocking forward put weight on the right leg, when rocking backward rock on to the left leg and pull the toes up on the right foot and tighten your quad and straighten your knee.seated on high table/ surface, swinging the right and left knee. Swing R knee forward while left knee goes back and vice versa.   Exercises - Active Straight Leg Raise with Quad Set  - 1 x daily - 4 x weekly - 2 sets - 15 reps - 3 hold - Sit to Stand with Hands on Knees  - 1 x daily - 4 x  weekly - 3 sets - 12 reps - Standing 3-way Hip with Walker  - 1 x daily - 4 x weekly - 1 sets - 10 reps - 2 hold - Heel Raises with Counter Support  - 1 x daily - 4 x weekly - 3 sets - 15 reps - 2 hold - Standing Knee Flexion with Counter Support  - 1 x daily - 4 x weekly - 2 sets - 10 reps - 5 sec hold - Prone Press Up On Elbows  - 2 x daily - 7 x weekly - 1 sets - 1 reps - 3-5 min hold - Hip Hiking on Step  - 1 x daily - 7 x weekly - 3 sets - 10 -15 reps  ASSESSMENT:  CLINICAL IMPRESSION:  01/07/2025 Lilyan arrives to sessio noting increased pain after the last session that began evening of Friday and progressed to Saturday and Sunday limited her ability to walk and bear weight. She received dry needling from another provider and noted improvement in pain and function. Pain is primarily along the medial aspect of the R knee, Palpation does reveal potential plical irritation. Focused on patellar stability exercises both proximal and distal to maximize support. Discussed reaching out to her referring provider for further assessment, which she has an appt for the end of the month. Reviewed posture and biomechanics with marching in regard to knee and hip position. Plan to continue to work on strengthening to maximize strength, stability and biomechanics.    EVAL: Patient is a 59 y.o. female who was seen today for physical therapy evaluation and treatment following R  TKA.   Patient is approximately 2 weeks postop with Aquacel dressing in place without signs of redness drainage or excessive warmth noted at knee and around dressing edges.  Patient ambulates with a rolling walker using a step through gait with slow cadence and decreased step length bilaterally.  She is able to transfer out of a chair independently but requires upper extremity assist to place right lower extremity onto table.  Patient is extremely fearful of knee flexion due to pain levels.  Patient is able to demonstrate good quad control and  facilitation with extension range of motion improving throughout session.  Knee flexion remain patient's main limitation as she was apprehensive about pain.  Patient is a good candidate for outpatient rehab to regain right knee strength and function.  She has a 2-week postop follow-up with her surgeons office and has been recommended to discuss optimum pain relieving techniques to allow more comfortable participation in rehab.  OBJECTIVE IMPAIRMENTS: Abnormal gait, decreased activity tolerance, decreased balance, decreased coordination, decreased endurance, decreased knowledge of condition, decreased mobility, difficulty walking, decreased ROM, decreased strength, increased fascial restrictions, impaired perceived functional ability, improper body mechanics, and pain.   ACTIVITY LIMITATIONS: carrying, lifting, sitting, standing, squatting, stairs, transfers, bed mobility, toileting, and locomotion level  PERSONAL FACTORS: Behavior pattern, Fitness, and Past/current experiences are also affecting patient's functional outcome.   REHAB POTENTIAL: Good  CLINICAL DECISION MAKING: Stable/uncomplicated  EVALUATION COMPLEXITY: Low   GOALS: Goals reviewed with patient? No  SHORT TERM GOALS: Target date: 11/09/2024   Patient to demonstrate independence in HEP  Baseline: [id number] Goal status: MET   2.  269ft ambulation with LRAD and appropriate gait pattern Baseline: 62ft with RW and step through pattern Goal status: MET  3.  90d AROM knee flexion Baseline: 55d Goal status: MET   LONG TERM GOALS: Target date: 12/07/2024    Patient will acknowledge 4/10 pain at least once during episode of care   Baseline: 8/10 12/14/24: 2/10 Goal status: MET   2.  Increase AROM of R knee to 115d flexion -5d extension Baseline:  A/PROM Right eval Left eval  Hip flexion    Hip extension    Hip abduction    Hip adduction    Hip internal rotation    Hip external rotation    Knee flexion 55/60d    Knee extension -16/-6d    Goal status: MET 12/15/24  3.  Increase R knee strength to 4/5 Baseline:  MMT Right eval Left eval  Hip flexion    Hip extension    Hip abduction    Hip adduction    Hip internal rotation    Hip external rotation    Knee flexion 3/5   Knee extension 3/5    Goal status: IN PROGRESS (12/14/24)  4.  Patient to negotiate 16 stairs with most appropriate pattern and assist. Baseline: TBD 12/14/24: dec tolerance to RLE advancement with steps Goal status: IN PROGRESS  5.  Patient will score at least 60/80 on LEFS to signify clinically meaningful improvement in functional abilities.   Baseline: 10/80 12/14/24: 24/80 Goal status: IN PROGRESS     PLAN:  PT FREQUENCY: 1-2x/week  PT DURATION: 8 weeks  PLANNED INTERVENTIONS: 97110-Therapeutic exercises, 97530- Therapeutic activity, 97112- Neuromuscular re-education, 97535- Self Care, [id number]- Manual therapy, [phone]- Gait training, Patient/Family education, Balance training, and Stair training  PLAN FOR NEXT SESSION: HEP review and update, manual techniques as appropriate, aerobic tasks, ROM and flexibility activities, strengthening and PREs, TPDN, gait and balance  training,aquatic therapy,   Joneen Fresh PT, DPT, LAT, ATC  01/07/25  3:33 PM               "

## 2025-01-11 ENCOUNTER — Ambulatory Visit: Admitting: Physical Therapy

## 2025-01-12 ENCOUNTER — Encounter: Payer: Self-pay | Admitting: Physical Therapy

## 2025-01-12 ENCOUNTER — Ambulatory Visit: Admitting: Physical Therapy

## 2025-01-12 DIAGNOSIS — M25561 Pain in right knee: Secondary | ICD-10-CM

## 2025-01-12 DIAGNOSIS — R2689 Other abnormalities of gait and mobility: Secondary | ICD-10-CM

## 2025-01-12 DIAGNOSIS — M6281 Muscle weakness (generalized): Secondary | ICD-10-CM

## 2025-01-14 ENCOUNTER — Ambulatory Visit: Admitting: Physical Therapy

## 2025-01-14 DIAGNOSIS — R2689 Other abnormalities of gait and mobility: Secondary | ICD-10-CM

## 2025-01-14 DIAGNOSIS — M6281 Muscle weakness (generalized): Secondary | ICD-10-CM

## 2025-01-14 DIAGNOSIS — M25561 Pain in right knee: Secondary | ICD-10-CM

## 2025-01-14 NOTE — Therapy (Signed)
 " OUTPATIENT PHYSICAL THERAPY TREATMENT  Patient Name: Kayla Archer MRN: [id number] DOB:1966/06/08, 59 y.o., female Today's Date: 01/14/2025  END OF SESSION:  PT End of Session - 01/14/25 1102     Visit Number 23    Number of Visits 30    Date for Recertification  01/27/25    PT Start Time 1103    PT Stop Time 1209    PT Time Calculation (min) 66 min    Activity Tolerance Patient tolerated treatment well    Behavior During Therapy [facility] for tasks assessed/performed                                Past Medical History:  Diagnosis Date   Cartilage tear    rt knee   CHB (complete heart block) (HCC) 04/24/2017   Overview:  History: Post-op  Assessment: CHB with escape rate 50-60's. S/p PPM placement 05.22.2018.   PMW intact. On low-dose BB.   Plan: Continue low dose BB at discharge. Follow EP recommendations at discharge.   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 04/30/2017 06/24/2017   PONV (postoperative nausea and vomiting)    plus hypotension   Past Surgical History:  Procedure Laterality Date   CARDIAC VALVE REPLACEMENT  04   thoracic aortic aneurysm plus valve   INSERT / REPLACE / REMOVE PACEMAKER     at [facility] 04/2017 for complete heart block   KNEE ARTHROSCOPY Right 12/28/2013   Procedure: ARTHROSCOPY KNEE;  Surgeon: Marcey Raman, MD;  Location: [facility] OR;  Service: Orthopedics;  Laterality: Right;   LABIOPLASTY Bilateral 10/04/2016   Procedure: LABIAL REDUCTION;  Surgeon: Rosaline Cobble, MD;  Location: [facility] ORS;  Service: Gynecology;  Laterality: Bilateral;   TEE WITHOUT CARDIOVERSION N/A 02/08/2017   Procedure: TRANSESOPHAGEAL ECHOCARDIOGRAM (TEE);  Surgeon: Jerel Balding, MD;  Location: [facility] ENDOSCOPY;  Service: Cardiovascular;  Laterality: N/A;   VALVE REPLACEMENT  04/2017 [facility]   Patient Active Problem  List   Diagnosis Date Noted   Acute medial meniscus tear of right knee 06/20/2023   Other tear of medial meniscus, current injury, left knee, subsequent encounter 06/20/2023   Heart disease 05/09/2021   Encounter for care of pacemaker 09/27/2019   Piriformis syndrome of left side 06/16/2019   H/O thoracic aortic aneurysm repair 02/14/2019   Other cytomegaloviral diseases (HCC) 02/14/2019   Vitamin D deficiency 02/14/2019   Degenerative arthritis of right knee 05/14/2018   Nontraumatic neck pain 10/25/2017   Nonallopathic lesion of cervical region 10/25/2017   Nonallopathic lesion of rib cage 10/25/2017   Hx of Lyme disease 07/15/2017   Pacemaker Medtronic 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 04/30/2017 06/24/2017   Atrial flutter (HCC) 05/02/2017   Transition of care performed with sharing of clinical summary 04/26/2017   CHB (complete heart block) (HCC) 04/24/2017   PFO (patent foramen ovale) 03/13/2017   Prosthetic aortic valve stenosis    Nonallopathic lesion of lumbosacral region 11/20/2016   Nonallopathic lesion of sacral region 11/20/2016   Nonallopathic lesion of thoracic region 11/20/2016   Left lumbar radiculopathy 11/13/2016   Left knee injury 03/29/2016   S/P arthroscopy of knee 11/02/2014   Mid sternal chest pain 03/31/2014   Aortic regurgitation 03/31/2014   S/P aortic valve replacement with bioprosthetic valve  03/31/2014   Knee joint pain 10/20/2013   Vitiligo 08/09/2003    PCP: Jacques Camie Pepper, PA-C   REFERRING PROVIDER: Viann Jayson Raddle., MD  REFERRING DIAG: STATUS POST RIGHT KNEE  THERAPY DIAG:  Acute pain of right knee  Other abnormalities of gait and mobility  Muscle weakness (generalized)  Rationale for Evaluation and Treatment: Rehabilitation  ONSET DATE: 09/29/24 DOS  SUBJECTIVE:   SUBJECTIVE STATEMENT: 01/14/2025 I am very frustrated, the area on the inside of my knee continues to give me issues and often takes a few days to resolve.  When it is inflammed it is more challenging to bear weight on that side.   EVAL: Presents to OPPT following R TKA following long standing history of R knee pain and dysfunction and unsuccessful conservative measures.  Patient focused on pain level with reluctance to rely on pain meds for fear of reliance on them.    PERTINENT HISTORY: None available PAIN:  Are you having pain? Yes: NPRS scale: 2/10 ( took meds this AM for relief) Pain location: R knee Pain description: ache, throb, sharp Aggravating factors: movement, activity Relieving factors: rest, meds  PRECAUTIONS: None for knee, pacemaker  RED FLAGS: None   WEIGHT BEARING RESTRICTIONS: No  FALLS:  Has patient fallen in last 6 months? No  LIVING ENVIRONMENT: Lives with: lives with their family Lives in: House/apartment Stairs: 2 Has following equipment at home: Environmental Consultant - 2 wheeled  OCCUPATION: not working  PLOF: Independent  PATIENT GOALS: To regain my knee function  NEXT MD VISIT: 10/13/24  OBJECTIVE:  Note: Objective measures were completed at Evaluation unless otherwise noted.  DIAGNOSTIC FINDINGS: none  PATIENT SURVEYS:  LEFS 39/80   MUSCLE LENGTH: N/T  POSTURE: flexed R knee  10/21/24 PALPATION: Good patellar mobility, no redness/warmth and minimal swelling/edema  LUMBAR ROM: Flexion: nil loss, L HS tighter than R Extension: 25% loss, central low back pain Side bend L: R sided low back pain Side bend R: nil loss, no symptoms  LOWER EXTREMITY ROM:  A/PROM Right eval Left eval RT 10/21/24 RT 10/26/24 RT 10/28/24 RT 11/02/24 RT 11/04/24 RT 11/09/24 RT 11/12/24 RT 11/18/2024 RT 11/30/2024 RLE 12/14/24 R 12/28/2024 R  Hip flexion                Hip extension                Hip abduction                Hip adduction                Hip internal rotation                Hip external rotation                Knee flexion 55/60d  AAROM 80d 72 A tx  88 A, 95 A 100 AAROM 100 A, 110A 112  116/120 118 120 129 129 130  Knee extension -16/-6d  AROM 10d lacking 7    5 5    0    Ankle dorsiflexion                Ankle plantarflexion                Ankle inversion                Ankle eversion                 (Blank rows =  not tested)  LOWER EXTREMITY MMT:  MMT Right eval Left eval RLE 12/14/24  Hip flexion     Hip extension     Hip abduction     Hip adduction     Hip internal rotation     Hip external rotation     Knee flexion 3/5  4/5  Knee extension 3/5  4-/5  Ankle dorsiflexion     Ankle plantarflexion     Ankle inversion     Ankle eversion      (Blank rows = not tested)  LOWER EXTREMITY SPECIAL TESTS:  Deferred post-op  FUNCTIONAL TESTS:  30 seconds chair stand test 1 with UE assist  GAIT: Distance walked: 77ftx2 Assistive device utilized: Environmental Consultant - 2 wheeled Level of assistance: Modified independence Comments: slow cadence, step through pattern                                                                                                                           TREATMENT:  [facility] Adult PT Treatment:                                                DATE: 01/14/25 US  100% @ x 6 min, intensity at 1.0w/cm2  x3 min and reduced to  .5 x3 min IASTM / XFM along the medial patellar structures Lateral patellar taping  Recumbent bike L1 x 5 min  Lunge 1 x 10 using TRX and touching back knee to Bosu ball, then switching lead leg x 10 Walking x 5 laps @ 180 ft per lap maintaining a consistent pace Reviewed how to perform lunge at home pushing into wall   [facility] Adult PT Treatment:                                                DATE: 01/12/2025 Trialed McConnell taping pulling the patella laterally Recumbent bike x 5 min  Step up and over 6 inch step x 10, then changed to 4 inche step Step downs with LLE in front using RLE only 2 x 10 cues to all foot to point out slightly.  Reviewed benefits and length of wear of tape and to remove if it bothers her.  Sidelying  hip abduction with CW/CCW circles to fatigue.   [facility] Adult PT Treatment:                                                DATE: 01/07/2025 Low load long duration lateral glide patellar stretch LAQ 2 x going to fatigue 5# with foot everted  Sidelying hip abduction 2 x 10 with 5# Attempted HIP ER strengthening but halted due to medial knee pain Seated hip IR with YTB 2 x 15 Standing marching in place with YTB around feet alternating L/R Updated HEP  [facility] Adult PT Treatment:                                                DATE: 12/31/2024 MTPR along VMO Low load long duration lateral patellar glide Elliptical L 1 , ramp L1 x 4 min Step downs 1 x 10, cues for foot positioning to reduce R medial knee pain  Step downs to focus on keeping pelvic height level with mirror for visual feedback Gait training with emphasis on hip extension/ push up Transfer from sitting in car and getting out to reduce R knee aggravation    PATIENT EDUCATION:  Education details: Discussed eval findings, rehab rationale and POC and patient is in agreement  Person educated: Patient and Spouse Education method: Explanation and Handouts Education comprehension: verbalized understanding and needs further education  HOME EXERCISE PROGRAM: Access Code: [email or web address] URL: [email or web address] Date: 12/28/2024 Prepared by: Joneen Fresh  Program Notes forward / backward weight shifting with the right foot advanced forward. when rocking forward put weight on the right leg, when rocking backward rock on to the left leg and pull the toes up on the right foot and tighten your quad and straighten your knee.seated on high table/ surface, swinging the right and left knee. Swing R knee forward while left knee goes back and vice versa.   Exercises - Active Straight Leg Raise with Quad Set  - 1 x daily - 4 x weekly - 2 sets - 15 reps - 3 hold - Sit to Stand with Hands on Knees  - 1 x daily - 4 x weekly - 3 sets - 12  reps - Standing 3-way Hip with Walker  - 1 x daily - 4 x weekly - 1 sets - 10 reps - 2 hold - Heel Raises with Counter Support  - 1 x daily - 4 x weekly - 3 sets - 15 reps - 2 hold - Standing Knee Flexion with Counter Support  - 1 x daily - 4 x weekly - 2 sets - 10 reps - 5 sec hold - Prone Press Up On Elbows  - 2 x daily - 7 x weekly - 1 sets - 1 reps - 3-5 min hold - Hip Hiking on Step  - 1 x daily - 7 x weekly - 3 sets - 10 -15 reps  ASSESSMENT:  CLINICAL IMPRESSION:  01/14/2025 Emmelia arrives to session with continued report of intermittent anteromedial knee pain that despite multiple interventions from Soft tissue work, DN (performed at another location), and continued strengthening, it continues to get inflammed resulting in pain and limited weight bearing status lasting for a minimum of 2 days. Trialed US  to promote remodeling of the medial structures followed with IASTM techniques. She continues to report the most relief with lateral patellar taping noting the pain is gone and that it feels like her knee is moving better. She was able to perform exercises today with the taping noting no medial knee pain, and finished with prlonged walking at a consistent pace around a loop and noted doing very well. She plans to see her referring provider for further assessment of the  anteriomeidal pain.   EVAL: Patient is a 59 y.o. female who was seen today for physical therapy evaluation and treatment following R TKA.   Patient is approximately 2 weeks postop with Aquacel dressing in place without signs of redness drainage or excessive warmth noted at knee and around dressing edges.  Patient ambulates with a rolling walker using a step through gait with slow cadence and decreased step length bilaterally.  She is able to transfer out of a chair independently but requires upper extremity assist to place right lower extremity onto table.  Patient is extremely fearful of knee flexion due to pain levels.  Patient is  able to demonstrate good quad control and facilitation with extension range of motion improving throughout session.  Knee flexion remain patient's main limitation as she was apprehensive about pain.  Patient is a good candidate for outpatient rehab to regain right knee strength and function.  She has a 2-week postop follow-up with her surgeons office and has been recommended to discuss optimum pain relieving techniques to allow more comfortable participation in rehab.  OBJECTIVE IMPAIRMENTS: Abnormal gait, decreased activity tolerance, decreased balance, decreased coordination, decreased endurance, decreased knowledge of condition, decreased mobility, difficulty walking, decreased ROM, decreased strength, increased fascial restrictions, impaired perceived functional ability, improper body mechanics, and pain.   ACTIVITY LIMITATIONS: carrying, lifting, sitting, standing, squatting, stairs, transfers, bed mobility, toileting, and locomotion level  PERSONAL FACTORS: Behavior pattern, Fitness, and Past/current experiences are also affecting patient's functional outcome.   REHAB POTENTIAL: Good  CLINICAL DECISION MAKING: Stable/uncomplicated  EVALUATION COMPLEXITY: Low   GOALS: Goals reviewed with patient? No  SHORT TERM GOALS: Target date: 11/09/2024   Patient to demonstrate independence in HEP  Baseline: [email or web address] Goal status: MET   2.  269ft ambulation with LRAD and appropriate gait pattern Baseline: 65ft with RW and step through pattern Goal status: MET  3.  90d AROM knee flexion Baseline: 55d Goal status: MET   LONG TERM GOALS: Target date: 12/07/2024    Patient will acknowledge 4/10 pain at least once during episode of care   Baseline: 8/10 12/14/24: 2/10 Goal status: MET   2.  Increase AROM of R knee to 115d flexion -5d extension Baseline:  A/PROM Right eval Left eval  Hip flexion    Hip extension    Hip abduction    Hip adduction    Hip internal rotation    Hip  external rotation    Knee flexion 55/60d   Knee extension -16/-6d    Goal status: MET 12/15/24  3.  Increase R knee strength to 4/5 Baseline:  MMT Right eval Left eval  Hip flexion    Hip extension    Hip abduction    Hip adduction    Hip internal rotation    Hip external rotation    Knee flexion 3/5   Knee extension 3/5    Goal status: IN PROGRESS (12/14/24)  4.  Patient to negotiate 16 stairs with most appropriate pattern and assist. Baseline: TBD 12/14/24: dec tolerance to RLE advancement with steps Goal status: IN PROGRESS  5.  Patient will score at least 60/80 on LEFS to signify clinically meaningful improvement in functional abilities.   Baseline: 10/80 12/14/24: 24/80 Goal status: IN PROGRESS     PLAN:  PT FREQUENCY: 1-2x/week  PT DURATION: 8 weeks  PLANNED INTERVENTIONS: 97110-Therapeutic exercises, 97530- Therapeutic activity, 97112- Neuromuscular re-education, 97535- Self Care, [id number]- Manual therapy, [phone]- Gait training, Patient/Family education, Balance training, and Stair training  PLAN FOR  NEXT SESSION: HEP review and update, manual techniques as appropriate, aerobic tasks, ROM and flexibility activities, strengthening and PREs, TPDN, gait and balance training,aquatic therapy,   Tiawanna Luchsinger PT, DPT, LAT, ATC  01/14/25  12:29 PM               "

## 2025-01-18 ENCOUNTER — Ambulatory Visit: Admitting: Physical Therapy

## 2025-01-19 ENCOUNTER — Ambulatory Visit: Admitting: Physical Therapy

## 2025-01-21 ENCOUNTER — Ambulatory Visit: Admitting: Physical Therapy

## 2025-01-25 ENCOUNTER — Ambulatory Visit: Admitting: Physical Therapy

## 2025-01-26 ENCOUNTER — Ambulatory Visit: Admitting: Physical Therapy

## 2025-01-28 ENCOUNTER — Ambulatory Visit: Admitting: Physical Therapy

## 2025-02-02 ENCOUNTER — Ambulatory Visit: Admitting: Physical Therapy

## 2025-02-04 ENCOUNTER — Ambulatory Visit: Admitting: Physical Therapy
